# Patient Record
Sex: Female | Born: 1950 | Race: White | Hispanic: No | Marital: Married | Smoking: Former smoker
Health system: Southern US, Community
[De-identification: ages and names within clinical notes are randomized; demographics above are authoritative.]

## PROBLEM LIST (undated history)

## (undated) DIAGNOSIS — R600 Localized edema: Secondary | ICD-10-CM

## (undated) DIAGNOSIS — G4730 Sleep apnea, unspecified: Secondary | ICD-10-CM

## (undated) DIAGNOSIS — I739 Peripheral vascular disease, unspecified: Secondary | ICD-10-CM

## (undated) DIAGNOSIS — K219 Gastro-esophageal reflux disease without esophagitis: Secondary | ICD-10-CM

## (undated) DIAGNOSIS — E669 Obesity, unspecified: Secondary | ICD-10-CM

## (undated) DIAGNOSIS — I2510 Atherosclerotic heart disease of native coronary artery without angina pectoris: Secondary | ICD-10-CM

## (undated) DIAGNOSIS — M1990 Unspecified osteoarthritis, unspecified site: Secondary | ICD-10-CM

## (undated) DIAGNOSIS — K589 Irritable bowel syndrome without diarrhea: Secondary | ICD-10-CM

## (undated) DIAGNOSIS — I10 Essential (primary) hypertension: Secondary | ICD-10-CM

## (undated) DIAGNOSIS — K449 Diaphragmatic hernia without obstruction or gangrene: Secondary | ICD-10-CM

## (undated) DIAGNOSIS — I8500 Esophageal varices without bleeding: Secondary | ICD-10-CM

## (undated) DIAGNOSIS — H3530 Unspecified macular degeneration: Secondary | ICD-10-CM

## (undated) DIAGNOSIS — D509 Iron deficiency anemia, unspecified: Secondary | ICD-10-CM

## (undated) DIAGNOSIS — R0602 Shortness of breath: Secondary | ICD-10-CM

## (undated) DIAGNOSIS — Z720 Tobacco use: Secondary | ICD-10-CM

## (undated) DIAGNOSIS — N840 Polyp of corpus uteri: Secondary | ICD-10-CM

## (undated) DIAGNOSIS — R0989 Other specified symptoms and signs involving the circulatory and respiratory systems: Secondary | ICD-10-CM

## (undated) DIAGNOSIS — E785 Hyperlipidemia, unspecified: Secondary | ICD-10-CM

## (undated) DIAGNOSIS — E119 Type 2 diabetes mellitus without complications: Secondary | ICD-10-CM

## (undated) DIAGNOSIS — R079 Chest pain, unspecified: Secondary | ICD-10-CM

## (undated) HISTORY — DX: Peripheral vascular disease, unspecified: I73.9

## (undated) HISTORY — DX: Unspecified osteoarthritis, unspecified site: M19.90

## (undated) HISTORY — PX: CAROTID ENDARTERECTOMY: SUR193

## (undated) HISTORY — DX: Chest pain, unspecified: R07.9

## (undated) HISTORY — DX: Essential (primary) hypertension: I10

## (undated) HISTORY — PX: CHOLECYSTECTOMY: SHX55

## (undated) HISTORY — DX: Obesity, unspecified: E66.9

## (undated) HISTORY — DX: Type 2 diabetes mellitus without complications: E11.9

## (undated) HISTORY — DX: Tobacco use: Z72.0

## (undated) HISTORY — DX: Other specified symptoms and signs involving the circulatory and respiratory systems: R09.89

## (undated) HISTORY — DX: Esophageal varices without bleeding: I85.00

## (undated) HISTORY — DX: Diaphragmatic hernia without obstruction or gangrene: K44.9

## (undated) HISTORY — DX: Localized edema: R60.0

## (undated) HISTORY — DX: Atherosclerotic heart disease of native coronary artery without angina pectoris: I25.10

## (undated) HISTORY — DX: Sleep apnea, unspecified: G47.30

## (undated) HISTORY — DX: Unspecified macular degeneration: H35.30

## (undated) HISTORY — DX: Gastro-esophageal reflux disease without esophagitis: K21.9

## (undated) HISTORY — PX: APPENDECTOMY: SHX54

## (undated) HISTORY — DX: Irritable bowel syndrome, unspecified: K58.9

## (undated) HISTORY — DX: Shortness of breath: R06.02

## (undated) HISTORY — DX: Iron deficiency anemia, unspecified: D50.9

## (undated) HISTORY — DX: Polyp of corpus uteri: N84.0

## (undated) HISTORY — DX: Hyperlipidemia, unspecified: E78.5

---

## 1997-05-17 HISTORY — PX: CARDIAC CATHETERIZATION: SHX172

## 2001-07-01 ENCOUNTER — Encounter: Payer: Self-pay | Admitting: Cardiovascular Disease

## 2001-07-01 ENCOUNTER — Ambulatory Visit (HOSPITAL_COMMUNITY): Admission: RE | Admit: 2001-07-01 | Discharge: 2001-07-01 | Payer: Self-pay | Admitting: Cardiovascular Disease

## 2001-07-01 HISTORY — PX: CARDIAC CATHETERIZATION: SHX172

## 2001-09-01 ENCOUNTER — Other Ambulatory Visit: Admission: RE | Admit: 2001-09-01 | Discharge: 2001-09-01 | Payer: Self-pay | Admitting: Oncology

## 2001-09-21 ENCOUNTER — Encounter: Admission: RE | Admit: 2001-09-21 | Discharge: 2001-09-21 | Payer: Self-pay | Admitting: Surgery

## 2002-03-14 ENCOUNTER — Encounter: Admission: RE | Admit: 2002-03-14 | Discharge: 2002-03-14 | Payer: Self-pay | Admitting: Internal Medicine

## 2002-03-14 ENCOUNTER — Encounter: Payer: Self-pay | Admitting: Internal Medicine

## 2002-09-01 ENCOUNTER — Encounter: Admission: RE | Admit: 2002-09-01 | Discharge: 2002-09-01 | Payer: Self-pay | Admitting: Internal Medicine

## 2002-09-01 ENCOUNTER — Encounter: Payer: Self-pay | Admitting: Internal Medicine

## 2005-03-10 ENCOUNTER — Encounter: Admission: RE | Admit: 2005-03-10 | Discharge: 2005-03-10 | Payer: Self-pay | Admitting: Internal Medicine

## 2005-03-28 ENCOUNTER — Encounter: Admission: RE | Admit: 2005-03-28 | Discharge: 2005-03-28 | Payer: Self-pay | Admitting: Internal Medicine

## 2005-09-18 ENCOUNTER — Encounter: Admission: RE | Admit: 2005-09-18 | Discharge: 2005-09-18 | Payer: Self-pay | Admitting: Internal Medicine

## 2007-03-18 HISTORY — PX: CARDIAC CATHETERIZATION: SHX172

## 2007-03-30 ENCOUNTER — Encounter: Payer: Self-pay | Admitting: Endocrinology

## 2007-04-08 ENCOUNTER — Ambulatory Visit: Payer: Self-pay | Admitting: Endocrinology

## 2007-04-08 DIAGNOSIS — M1990 Unspecified osteoarthritis, unspecified site: Secondary | ICD-10-CM | POA: Insufficient documentation

## 2007-04-08 DIAGNOSIS — N184 Chronic kidney disease, stage 4 (severe): Secondary | ICD-10-CM

## 2007-04-08 DIAGNOSIS — K589 Irritable bowel syndrome without diarrhea: Secondary | ICD-10-CM | POA: Insufficient documentation

## 2007-04-08 DIAGNOSIS — G4730 Sleep apnea, unspecified: Secondary | ICD-10-CM | POA: Insufficient documentation

## 2007-04-08 DIAGNOSIS — K219 Gastro-esophageal reflux disease without esophagitis: Secondary | ICD-10-CM

## 2007-04-08 DIAGNOSIS — H3530 Unspecified macular degeneration: Secondary | ICD-10-CM | POA: Insufficient documentation

## 2007-04-08 DIAGNOSIS — I2510 Atherosclerotic heart disease of native coronary artery without angina pectoris: Secondary | ICD-10-CM

## 2007-04-08 DIAGNOSIS — E1122 Type 2 diabetes mellitus with diabetic chronic kidney disease: Secondary | ICD-10-CM | POA: Insufficient documentation

## 2007-04-08 DIAGNOSIS — E669 Obesity, unspecified: Secondary | ICD-10-CM | POA: Insufficient documentation

## 2007-04-12 ENCOUNTER — Telehealth (INDEPENDENT_AMBULATORY_CARE_PROVIDER_SITE_OTHER): Payer: Self-pay | Admitting: *Deleted

## 2007-06-23 ENCOUNTER — Encounter: Admission: RE | Admit: 2007-06-23 | Discharge: 2007-06-23 | Payer: Self-pay | Admitting: Obstetrics and Gynecology

## 2007-08-17 ENCOUNTER — Ambulatory Visit (HOSPITAL_COMMUNITY): Admission: RE | Admit: 2007-08-17 | Discharge: 2007-08-17 | Payer: Self-pay | Admitting: Obstetrics and Gynecology

## 2007-08-17 ENCOUNTER — Encounter (INDEPENDENT_AMBULATORY_CARE_PROVIDER_SITE_OTHER): Payer: Self-pay | Admitting: Obstetrics and Gynecology

## 2007-09-20 ENCOUNTER — Encounter: Payer: Self-pay | Admitting: Endocrinology

## 2007-09-28 ENCOUNTER — Telehealth (INDEPENDENT_AMBULATORY_CARE_PROVIDER_SITE_OTHER): Payer: Self-pay | Admitting: *Deleted

## 2007-10-07 ENCOUNTER — Ambulatory Visit: Payer: Self-pay | Admitting: Endocrinology

## 2007-10-07 DIAGNOSIS — R05 Cough: Secondary | ICD-10-CM

## 2008-05-25 ENCOUNTER — Encounter: Admission: RE | Admit: 2008-05-25 | Discharge: 2008-05-25 | Payer: Self-pay | Admitting: Orthopedic Surgery

## 2008-07-20 ENCOUNTER — Ambulatory Visit (HOSPITAL_COMMUNITY): Admission: RE | Admit: 2008-07-20 | Discharge: 2008-07-20 | Payer: Self-pay | Admitting: Anesthesiology

## 2008-08-08 ENCOUNTER — Ambulatory Visit (HOSPITAL_COMMUNITY): Admission: RE | Admit: 2008-08-08 | Discharge: 2008-08-08 | Payer: Self-pay | Admitting: Internal Medicine

## 2008-08-19 ENCOUNTER — Encounter: Admission: RE | Admit: 2008-08-19 | Discharge: 2008-08-19 | Payer: Self-pay | Admitting: Anesthesiology

## 2008-09-13 ENCOUNTER — Encounter: Admission: RE | Admit: 2008-09-13 | Discharge: 2008-09-13 | Payer: Self-pay | Admitting: Internal Medicine

## 2008-09-13 ENCOUNTER — Encounter (INDEPENDENT_AMBULATORY_CARE_PROVIDER_SITE_OTHER): Payer: Self-pay | Admitting: Interventional Radiology

## 2008-09-13 ENCOUNTER — Other Ambulatory Visit: Admission: RE | Admit: 2008-09-13 | Discharge: 2008-09-13 | Payer: Self-pay | Admitting: Interventional Radiology

## 2009-01-23 ENCOUNTER — Encounter: Admission: RE | Admit: 2009-01-23 | Discharge: 2009-01-23 | Payer: Self-pay | Admitting: Endocrinology

## 2009-03-22 ENCOUNTER — Encounter: Admission: RE | Admit: 2009-03-22 | Discharge: 2009-03-22 | Payer: Self-pay | Admitting: Internal Medicine

## 2009-04-17 ENCOUNTER — Ambulatory Visit: Payer: Self-pay | Admitting: Oncology

## 2009-04-26 LAB — CBC WITH DIFFERENTIAL/PLATELET
Basophils Absolute: 0.1 10*3/uL (ref 0.0–0.1)
Eosinophils Absolute: 0.2 10*3/uL (ref 0.0–0.5)
HGB: 12.8 g/dL (ref 11.6–15.9)
LYMPH%: 27.4 % (ref 14.0–49.7)
MONO#: 0.4 10*3/uL (ref 0.1–0.9)
NEUT#: 4.5 10*3/uL (ref 1.5–6.5)
Platelets: 116 10*3/uL — ABNORMAL LOW (ref 145–400)
RBC: 4.48 10*6/uL (ref 3.70–5.45)
RDW: 14.3 % (ref 11.2–14.5)
WBC: 7.1 10*3/uL (ref 3.9–10.3)

## 2009-04-26 LAB — LACTATE DEHYDROGENASE: LDH: 107 U/L (ref 94–250)

## 2009-04-26 LAB — COMPREHENSIVE METABOLIC PANEL
Albumin: 3.8 g/dL (ref 3.5–5.2)
CO2: 22 mEq/L (ref 19–32)
Glucose, Bld: 399 mg/dL — ABNORMAL HIGH (ref 70–99)
Potassium: 4.8 mEq/L (ref 3.5–5.3)
Sodium: 136 mEq/L (ref 135–145)
Total Protein: 6.9 g/dL (ref 6.0–8.3)

## 2009-05-30 ENCOUNTER — Ambulatory Visit: Payer: Self-pay | Admitting: Oncology

## 2009-06-26 LAB — CBC WITH DIFFERENTIAL/PLATELET
BASO%: 0.8 % (ref 0.0–2.0)
Basophils Absolute: 0.1 10*3/uL (ref 0.0–0.1)
EOS%: 3 % (ref 0.0–7.0)
Eosinophils Absolute: 0.2 10*3/uL (ref 0.0–0.5)
HCT: 37.4 % (ref 34.8–46.6)
LYMPH%: 27.5 % (ref 14.0–49.7)
MCHC: 33.4 g/dL (ref 31.5–36.0)
MCV: 87 fL (ref 79.5–101.0)
MONO%: 5.6 % (ref 0.0–14.0)
NEUT#: 5.3 10*3/uL (ref 1.5–6.5)
RBC: 4.3 10*6/uL (ref 3.70–5.45)
RDW: 14.4 % (ref 11.2–14.5)
WBC: 8.4 10*3/uL (ref 3.9–10.3)

## 2010-01-24 ENCOUNTER — Encounter: Admission: RE | Admit: 2010-01-24 | Discharge: 2010-01-24 | Payer: Self-pay | Admitting: Unknown Physician Specialty

## 2010-06-08 ENCOUNTER — Encounter: Payer: Self-pay | Admitting: Unknown Physician Specialty

## 2010-06-09 ENCOUNTER — Encounter: Payer: Self-pay | Admitting: Orthopedic Surgery

## 2010-06-09 ENCOUNTER — Encounter: Payer: Self-pay | Admitting: Internal Medicine

## 2010-06-09 ENCOUNTER — Encounter: Payer: Self-pay | Admitting: Anesthesiology

## 2010-10-01 NOTE — Op Note (Signed)
NAMELENNY, BOUCHILLON                ACCOUNT NO.:  [id number]   MEDICAL RECORD NO.:  [id number]          PATIENT TYPE:  AMB   LOCATION:  SDC                           FACILITY:  [facility]   PHYSICIAN:  Carrington Clamp, M.D. DATE OF BIRTH:  11-01-1950   DATE OF PROCEDURE:  08/17/2007  DATE OF DISCHARGE:                               OPERATIVE REPORT   PREOPERATIVE DIAGNOSIS:  Postmenopausal bleeding.   POSTOPERATIVE DIAGNOSIS:  Endometrial polyp.   PROCEDURES:  Dilation and curettage with hysteroscopy.   SURGEON:  Carrington Clamp, M.D.   ANESTHESIA:  General LMA.   FINDINGS:  A large 2 x 3-cm polyp coming from the left fundus of the  uterus, but there was no other abnormal tissue seen.   SPECIMENS:  Uterine curettings and polyp to pathology.   ESTIMATED BLOOD LOSS:  Minimal.   IV FLUIDS:  1200 mL.   URINE OUTPUT:  Not measured.   COMPLICATIONS:  None.   MEDICATIONS:  None.   COUNT:  Correct x3.   TECHNIQUE:  After adequate general anesthesia was achieved, the patient  was prepped and draped in the usual sterile fashion in the dorsal  lithotomy position.  The bladder was emptied with a red rubber catheter  and the speculum placed in the vagina.  A single-tooth tenaculum was  used to stabilize the cervix and the cervix was dilated with Hanks  dilators.  The hysteroscope was passed into the uterine cavity and the  above findings noted.  The 2-cm polyp was removed with polyp forceps in  several pieces.  Curettage was then performed with a sharp curette.  Hysteroscopy indicated all removal of the polyp and some endometrial  tissue.  Hysteroscopic deficit was measured  at 135 but there was at least 100 mL of fluid that had, unfortunately,  fallen on the floor; therefore, the hysteroscopic deficit was really 35.  The patient tolerated the procedure well and was returned to the  recovery room in stable condition.      Carrington Clamp, M.D.  Electronically Signed     MH/MEDQ  D:  08/17/2007  T:  08/17/2007  Job:  [id number]

## 2010-10-02 ENCOUNTER — Other Ambulatory Visit: Payer: Self-pay | Admitting: Surgery

## 2010-10-02 DIAGNOSIS — E041 Nontoxic single thyroid nodule: Secondary | ICD-10-CM

## 2010-10-04 NOTE — Cardiovascular Report (Signed)
[place]. [facility]  Patient:    Monica [name], Monica [name] Visit Number: [id number] MRN: [id number]          Service Type: CAT Location: [facility] 2899 11 Attending Physician:  Berry, Jonathan [place] Dictated by:   Runell Gess, M.D. Proc. Date: 07/01/01 Admit Date:  07/01/2001   CC:         Second Floor [facility]  Feliciana Rossetti, M.D., [facility] [address], [place] [address]   Cardiac Catheterization  INDICATIONS:  [name] is a 60 year old, moderately overweight white female with positive cardiac risk factors with a history of normal catheterization May 18, 1997, who has had recurrent chest pain and a Cardiolite stress test suggestive of ischemia in the RCA territory.  She presents now for diagnostic coronary arteriography.  PROCEDURE:  Diagnostic coronary arteriography  CARDIOLOGIST:  Runell Gess, M.D.  DESCRIPTION OF PROCEDURE:  The patient was brought to second floor [facility] in the postabsorptive state.  She was premedicated with p.o. Valium, IV Versed, and fentanyl.  Her right groin was prepped and shaved in the usual sterile fashion.  Xylocaine 1% was used for local anesthesia.  A 6-French sheath was inserted into the right femoral artery using standard Seldinger technique.  Then, 6-French right and left Judkins diagnostic catheters along with a 6-French pigtail catheter were used for selective coronary angiography, left ventriculography, and distal abdominal aortography.  Omnipaque dye was used for the entirety of the case.  Retrograde aorta, left ventricular, and pullback pressures were recorded.  HEMODYNAMICS: 1. Aortic systolic pressure 149, diastolic pressure 85. 2. Left ventricular systolic pressure 148, diastolic pressure 14.  SELECTIVE CORONARY ANGIOGRAPHY: 1. Left main normal. 2. LAD normal. 3. Left circumflex normal. 4. Right  coronary artery was large, dominant, and normal.  LEFT VENTRICULOGRAPHY:  RAO left ventriculogram was performed using 25 cc of Omnipaque dye at 12 cc/second.  The overall LV EF was estimated at greater than 60% without focal wall motion abnormalities.  DISTAL ABDOMINAL AORTOGRAPHY:  Distal abdominal aortogram was performed using 20 cc of Omnipaque dye at 20 cc per second.  The renal artery was widely patent.  The infrarenal abdominal aorta was free of aneurysmal or atherosclerotic changes.  IMPRESSION:  Ms. Hullum has essentially normal coronary arteries and normal left ventricular function.  She also has normal renal arteries.  I suspect the chest pain is noncardiac and most likely multifactorial including reflux related.  She had a false positive Cardiolite.  The guidewire and catheters were removed.  The sheath was removed and pressure applied to the groin to achieve hemostasis.  The patient left the lab in stable condition.  Continued medical therapy will be recommended.  The patient will be discharged home today after remaining supine for 6 hours, and we will see her back in the office in three to four weeks in followup.  She left the lab in stable condition.Dictated by:   Runell Gess, M.D. Attending Physician:  Berry, Jonathan [place] DD:  07/01/01 TD:  07/01/01 Job: [id number]

## 2010-10-07 ENCOUNTER — Other Ambulatory Visit: Payer: Self-pay | Admitting: Orthopedic Surgery

## 2010-10-07 DIAGNOSIS — M25561 Pain in right knee: Secondary | ICD-10-CM

## 2010-10-07 DIAGNOSIS — M25511 Pain in right shoulder: Secondary | ICD-10-CM

## 2010-10-16 ENCOUNTER — Other Ambulatory Visit: Payer: Self-pay

## 2010-10-21 ENCOUNTER — Other Ambulatory Visit: Payer: Self-pay

## 2010-10-21 ENCOUNTER — Emergency Department (HOSPITAL_COMMUNITY): Payer: Medicare Other

## 2010-10-21 ENCOUNTER — Emergency Department (HOSPITAL_COMMUNITY)
Admission: EM | Admit: 2010-10-21 | Discharge: 2010-10-21 | Disposition: A | Discharge status: Home / Self Care | Payer: Medicare Other | Attending: Emergency Medicine | Admitting: Emergency Medicine

## 2010-10-21 DIAGNOSIS — M549 Dorsalgia, unspecified: Secondary | ICD-10-CM | POA: Insufficient documentation

## 2010-10-21 DIAGNOSIS — M542 Cervicalgia: Secondary | ICD-10-CM | POA: Insufficient documentation

## 2010-10-21 DIAGNOSIS — M25569 Pain in unspecified knee: Secondary | ICD-10-CM | POA: Insufficient documentation

## 2010-10-21 DIAGNOSIS — R51 Headache: Secondary | ICD-10-CM | POA: Insufficient documentation

## 2010-10-22 ENCOUNTER — Other Ambulatory Visit: Payer: Self-pay

## 2010-10-22 ENCOUNTER — Inpatient Hospital Stay: Admission: RE | Admit: 2010-10-22 | Payer: Self-pay | Source: Ambulatory Visit

## 2010-11-27 ENCOUNTER — Ambulatory Visit
Admission: RE | Admit: 2010-11-27 | Discharge: 2010-11-27 | Disposition: A | Discharge status: Home / Self Care | Payer: Medicare Other | Source: Ambulatory Visit | Attending: Surgery | Admitting: Surgery

## 2010-11-27 ENCOUNTER — Ambulatory Visit
Admission: RE | Admit: 2010-11-27 | Discharge: 2010-11-27 | Disposition: A | Discharge status: Home / Self Care | Payer: Medicaid Other | Source: Ambulatory Visit | Attending: Surgery | Admitting: Surgery

## 2010-11-27 DIAGNOSIS — E041 Nontoxic single thyroid nodule: Secondary | ICD-10-CM

## 2010-12-04 ENCOUNTER — Encounter: Payer: Self-pay | Admitting: Internal Medicine

## 2010-12-04 NOTE — Progress Notes (Signed)
Patient sees Dr Chales Abrahams in [place], [place] for Gastroenterology needs. Had colonoscopy there in 2011.

## 2010-12-23 ENCOUNTER — Other Ambulatory Visit: Payer: Medicaid Other

## 2011-02-03 ENCOUNTER — Other Ambulatory Visit: Payer: Medicare Other

## 2011-02-04 ENCOUNTER — Ambulatory Visit: Payer: Medicare Other | Admitting: Internal Medicine

## 2011-02-10 LAB — BASIC METABOLIC PANEL
BUN: 9
CO2: 29
Calcium: 9
Chloride: 99
Chloride: 96
Creatinine, Ser: 0.7
GFR calc Af Amer: >60
GFR calc non Af Amer: >60
Glucose, Bld: 379 — ABNORMAL HIGH
Potassium: 4.3
Potassium: 4.1
Sodium: 132 — ABNORMAL LOW

## 2011-02-10 LAB — CBC
HCT: 40.9
MCHC: 33.5
MCV: 83.5
Platelets: 226
Platelets: 221
RBC: 4.79
RDW: 14.6
RDW: 13.9

## 2011-02-10 LAB — URINALYSIS, ROUTINE W REFLEX MICROSCOPIC
Bilirubin Urine: NEGATIVE
Ketones, ur: NEGATIVE
Nitrite: NEGATIVE
Urobilinogen, UA: 0.2

## 2011-03-18 ENCOUNTER — Ambulatory Visit (INDEPENDENT_AMBULATORY_CARE_PROVIDER_SITE_OTHER): Payer: Medicare Other | Admitting: Internal Medicine

## 2011-03-18 ENCOUNTER — Encounter: Payer: Self-pay | Admitting: Internal Medicine

## 2011-03-18 ENCOUNTER — Other Ambulatory Visit (INDEPENDENT_AMBULATORY_CARE_PROVIDER_SITE_OTHER): Payer: Medicare Other

## 2011-03-18 DIAGNOSIS — R933 Abnormal findings on diagnostic imaging of other parts of digestive tract: Secondary | ICD-10-CM

## 2011-03-18 DIAGNOSIS — K7460 Unspecified cirrhosis of liver: Secondary | ICD-10-CM

## 2011-03-18 DIAGNOSIS — R634 Abnormal weight loss: Secondary | ICD-10-CM

## 2011-03-18 DIAGNOSIS — K589 Irritable bowel syndrome without diarrhea: Secondary | ICD-10-CM

## 2011-03-18 DIAGNOSIS — K219 Gastro-esophageal reflux disease without esophagitis: Secondary | ICD-10-CM

## 2011-03-18 LAB — PROTIME-INR: INR: 1.5 ratio — ABNORMAL HIGH (ref 0.8–1.0)

## 2011-03-18 NOTE — Patient Instructions (Addendum)
You have been given a separate informational sheet regarding your tobacco use, the importance of quitting and local resources to help you quit. Go to basement for labs Follow-up in three weeks with Dr. Marina Goodell

## 2011-03-19 LAB — IBC PANEL: Iron: 22 ug/dL — ABNORMAL LOW (ref 42–145)

## 2011-03-19 LAB — ANA: Anti Nuclear Antibody(ANA): NEGATIVE

## 2011-03-19 LAB — FERRITIN: Ferritin: 140.8 ng/mL (ref 10.0–291.0)

## 2011-03-19 LAB — IRON: Iron: 22 ug/dL — ABNORMAL LOW (ref 42–145)

## 2011-03-20 ENCOUNTER — Encounter: Payer: Self-pay | Admitting: Internal Medicine

## 2011-03-20 NOTE — Progress Notes (Signed)
HISTORY OF PRESENT ILLNESS:  Monica Monica [name] is a 60 y.o. female with MULTIPLE medical problems including diabetes mellitus on insulin, coronary artery disease on Plavix, chronic morbid obesity, sleep apnea, GERD, anxiety on anxiolytics, hypertension, and irritable bowel syndrome. She is also status post appendectomy and cholecystectomy. She is new to this office, but has had gastroenterology care in [place]. She is sent by her primary provider with [facility]. Reasons for the visit seemed to be chronic abdominal pain, weight loss, and a new diagnosis of hepatic cirrhosis by CT scan. She is accompanied by her husband who contributes to the history. Apparently, she has had decades of alternating bowel habits abdominal discomfort and bloating. I'm told that she has undergone extensive workup in the past including colonoscopy, upper endoscopy, and capsule endoscopy. I'm also told, that she has seen gastroenterologists at 2 separate tertiary care centers, [facility] and [facility]. Initially sent to rule out inflammatory bowel disease but ultimately diagnosed with irritable bowel syndrome. She continues to have alternating bowel habits and chronic lower abdominal discomfort often exacerbated by meals. Review of outside records finds a colonoscopy report dated 05/23/2009. She was found to have moderate internal hemorrhoids, mild sigmoid diverticulosis, and an incidental lipoma in the region of the terminal ileum. They bring photographs. I do not have an upper endoscopy report, though there are photographs on upper endoscopy, which appear unremarkable. There are dated the same. They were under the impression that they have a "tumor" in the colon and a curious as to why it was not removed. She also reports a several year history of right-sided abdominal pain that occurs 24 hours a day 7 days a week. She cannot identify any exacerbating or relieving factors, though the pain is worse with direct palpation and  occasionally meals. She also reports a 75 pound weight loss over the past 8 months. She has had decreased appetite. She has Monica Monica [name] her hair. Review of outside laboratories from October 2012 show hemoglobin of 11.2, white blood cell count 12.6, and platelet count of 198,000. Negative acute hepatitis serologies. Liver function tests normal protein 6.7, albumin 3.4. Electrolytes normal. Creatinine 1.1. Because of chronic right-sided pain she underwent an abdominal ultrasound dated 03/12/2011. 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 CT scan was recommended and performed 03/05/2011 (not clear why the dates of liquidy sequence reported by the patient) and was said to show a nodular hepatic contour suggesting cirrhosis as well as mild splenomegaly measuring 13.7 cm. No other abnormalities reported. The patient denies a personal or family history of liver disease. She does not consume alcohol. Reports being about 300 pounds for great majority of her life. No history of IV drug use or blood transfusions. She is apparently anticipating hysterectomy.  REVIEW OF SYSTEMS:  All non-GI ROS negative except for anxiety, arthritis, back pain, left eye blindness, cough, fatigue, headaches, itching, swelling of the feet, excessive thirst, urinary leakage, alopecia  Past Medical History  Diagnosis Date  . Type II or unspecified type diabetes mellitus without mention of complication, not stated as uncontrolled   . Coronary atherosclerosis of unspecified type of vessel, native or graft   . GERD (gastroesophageal reflux disease)   . Osteoarthrosis, unspecified whether generalized or localized, unspecified site   . Obesity, unspecified   . Macular degeneration (senile) of retina, unspecified   . Unspecified sleep apnea   . Irritable bowel syndrome   . Endometrial polyp   . Hypertension     Past Surgical  History  Procedure Date  . Appendectomy   . Cholecystectomy     Social History Monica Monica [name]  reports that she has Monica [name] smoking Cigarettes.  She has never used smokeless tobacco. She reports that she does not drink alcohol or use illicit drugs.  family history includes Breast cancer in her mother and sister; Colon cancer in her paternal aunt; and Diabetes in her brother, father, and sister.  Allergies  Allergen Reactions  . Clarithromycin   . Penicillins   . Sulfonamide Derivatives        PHYSICAL EXAMINATION: Vital signs: BP 98/56  Pulse 56  Ht 5\' 5"  (1.651 m)  Wt 177 lb (80.287 kg)  BMI 29.45 kg/m2  Constitutional: Chronically ill-appearing, no acute distress. Obese Psychiatric: alert and oriented x3, cooperative Head: Alopecia Eyes: extraocular movements intact, anicteric, conjunctiva pink Mouth: oral pharynx moist, no lesions Neck: supple no lymphadenopathy Cardiovascular: heart regular rate and rhythm, systolic murmur  Lungs: clear to auscultation bilaterally Abdomen: soft, obese nontender, with exquisite tenderness over the superficial abdominal wall with minimal palpation, no obvious ascites, no peritoneal signs, normal bowel sounds, no organomegaly. Prior surgical scars obvious and well healed Rectal: Ommitted Extremities: no lower extremity edema bilaterally Skin: no lesions on visible extremities Neuro: No focal deficits. No asterixis.     ASSESSMENT:  #1. Chronic right-sided abdominal pain. Musculoskeletal in nature #2. 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 No evidence for decompensated liver disease. #3. Chronic irritable bowel syndrome #4. GERD. Controlled with pantoprazole   PLAN:  #1. Treatment of musculoskeletal pain per primary provider. #2. Long discussion today regarding hepatic cirrhosis. Discussed NASH. Also discussed potential complications of liver disease. Will obtain additional blood work to rule out other potential causes for chronic liver disease. Also check thyroid function  regarding weight loss and hair loss #3. Discussion on irritable bowel syndrome #4. Office followup in a few weeks. Would consider upper endoscopy to assess for varices

## 2011-04-09 ENCOUNTER — Ambulatory Visit: Payer: Medicare Other | Admitting: Internal Medicine

## 2011-04-22 ENCOUNTER — Ambulatory Visit: Payer: Medicare Other | Admitting: Internal Medicine

## 2011-04-25 ENCOUNTER — Ambulatory Visit: Payer: Medicare Other | Admitting: Internal Medicine

## 2011-05-05 ENCOUNTER — Ambulatory Visit: Payer: Medicare Other | Admitting: Internal Medicine

## 2011-05-09 ENCOUNTER — Ambulatory Visit: Payer: Medicare Other | Admitting: Internal Medicine

## 2011-05-26 ENCOUNTER — Encounter: Payer: Self-pay | Admitting: Internal Medicine

## 2011-05-26 ENCOUNTER — Ambulatory Visit (INDEPENDENT_AMBULATORY_CARE_PROVIDER_SITE_OTHER): Payer: Medicare Other | Admitting: Internal Medicine

## 2011-05-26 VITALS — BP 130/64 | HR 64 | Ht 65.5 in | Wt 181.4 lb

## 2011-05-26 DIAGNOSIS — D509 Iron deficiency anemia, unspecified: Secondary | ICD-10-CM

## 2011-05-26 DIAGNOSIS — K7460 Unspecified cirrhosis of liver: Secondary | ICD-10-CM

## 2011-05-26 DIAGNOSIS — E119 Type 2 diabetes mellitus without complications: Secondary | ICD-10-CM

## 2011-05-26 DIAGNOSIS — K766 Portal hypertension: Secondary | ICD-10-CM

## 2011-05-26 DIAGNOSIS — K589 Irritable bowel syndrome without diarrhea: Secondary | ICD-10-CM

## 2011-05-26 MED ORDER — FERROUS SULFATE 325 (65 FE) MG PO TABS: 325.0000 mg | ORAL_TABLET | Freq: Every day | ORAL | Status: DC

## 2011-05-26 NOTE — Progress Notes (Signed)
HISTORY OF PRESENT ILLNESS:  Monica [name] is a 61 y.o. female with MULTIPLE medical problems including diabetes mellitus on insulin, coronary artery disease on Plavix, chronic morbid obesity, sleep apnea, GERD, anxiety on anxiolytics, hypertension, and irritable bowel syndrome. She is also status post appendectomy and cholecystectomy. She was initially seen in the office 03/18/2011 regarding newly diagnosed hepatic cirrhosis, chronic right-sided abdominal pain, chronic alternating bowel habits consistent with irritable bowel syndrome, and GERD. See that dictation for details. She has had multiple prior GI evaluations as previously outlined. 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 Additionally, right-sided musculoskeletal pain, chronic irritable bowel syndrome, and GERD. Multiple laboratories were obtained at that time. Supplemental studies did not support other causes for chronic liver disease. Iron saturation was low. She is accompanied today by her husband. Overall she states she is feeling somewhat better, for no particular reason. Chronic right-sided pain is unchanged. Continues to complain apparent loss. Thyroid testing unremarkable. We reviewed her studies in detail.. She is again accompanied by her husband who participates during the encounter.  REVIEW OF SYSTEMS:  All non-GI ROS negative except for fatigue,hair loss  Past Medical History  Diagnosis Date  . Type II or unspecified type diabetes mellitus without mention of complication, not stated as uncontrolled   . Coronary atherosclerosis of unspecified type of vessel, native or graft   . GERD (gastroesophageal reflux disease)   . Osteoarthrosis, unspecified whether generalized or localized, unspecified site   . Obesity, unspecified   . Macular degeneration (senile) of retina, unspecified   . Unspecified sleep apnea   . Irritable bowel syndrome   . Endometrial polyp   . Hypertension      Past Surgical History  Procedure Date  . Appendectomy   . Cholecystectomy   . Carotid endarterectomy     Social History Monica [name]  reports that she has been smoking Cigarettes.  She has never used smokeless tobacco. She reports that she does not drink alcohol or use illicit drugs.  family history includes Breast cancer in her mother and sister; Colon cancer in her paternal aunt; Diabetes in her brother, father, and sister; and Stomach cancer in her paternal aunt.  Allergies  Allergen Reactions  . Clarithromycin   . Latex   . Penicillins   . Sulfonamide Derivatives        PHYSICAL EXAMINATION: Vital signs: BP 130/64  Pulse 64  Ht 5' 5.5" (1.664 m)  Wt 181 lb 6.4 oz (82.283 kg)  BMI 29.73 kg/m2.- gained 4 pounds General: Well-developed, well-nourished, no acute distress HEENT: Sclerae are anicteric, conjunctiva pink. Oral mucosa intact Lungs: Clear Heart: Regular Abdomen: soft,obese, muscle wall tenderness on the right, nondistended, no obvious ascites, no peritoneal signs, normal bowel sounds. No organomegaly.prior surgical incisions well-healed Extremities: No edema Psychiatric: alert and oriented x3. Cooperative    ASSESSMENT:  #1. Hepatic cirrhosis with mild portal hypertension. Mild thrombocytopenia, mildly increased prothrombin time, changes on CT. 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 GERD. Controlled with PPI #3. Chronic irritable bowel syndrome. Ongoing #4. Right-sided musculoskeletal pain. Chronic and ongoing #5. Iron deficiency. Question related to hair loss #6. Multiple medical problems including diabetes   PLAN:  #1. Iron sulfate 325 mg daily prescribed #2. Continue PPI #3. Screening upper endoscopy for varices.The nature of the procedure, as well as the risks, benefits, and alternatives were carefully and thoroughly reviewed with the patient. Ample time for discussion and questions allowed. The patient understood, was satisfied, and  agreed to  proceed. Hold diabetic medications today at the procedure until resuming by mouth intake. Monitor blood sugar before and after the procedure. #4. Return to the care of Dr. Ardelle Park for general medical needs

## 2011-05-26 NOTE — Patient Instructions (Addendum)
You have been given a separate informational sheet regarding your tobacco use, the importance of quitting and local resources to help you quit.  You have been scheduled for an endoscopy. Please follow written instructions given to you at your visit today.  We have sent a prescription for Iron to your pharmacy

## 2011-06-03 ENCOUNTER — Encounter: Payer: Self-pay | Admitting: Internal Medicine

## 2011-06-03 ENCOUNTER — Ambulatory Visit (AMBULATORY_SURGERY_CENTER): Payer: Medicare Other | Admitting: Internal Medicine

## 2011-06-03 DIAGNOSIS — D509 Iron deficiency anemia, unspecified: Secondary | ICD-10-CM

## 2011-06-03 DIAGNOSIS — K219 Gastro-esophageal reflux disease without esophagitis: Secondary | ICD-10-CM

## 2011-06-03 DIAGNOSIS — K589 Irritable bowel syndrome without diarrhea: Secondary | ICD-10-CM

## 2011-06-03 DIAGNOSIS — I8500 Esophageal varices without bleeding: Secondary | ICD-10-CM

## 2011-06-03 DIAGNOSIS — K7460 Unspecified cirrhosis of liver: Secondary | ICD-10-CM

## 2011-06-03 DIAGNOSIS — K766 Portal hypertension: Secondary | ICD-10-CM

## 2011-06-03 LAB — GLUCOSE, CAPILLARY
Glucose-Capillary: 164 mg/dL — ABNORMAL HIGH (ref 70–99)
Glucose-Capillary: 168 mg/dL — ABNORMAL HIGH (ref 70–99)

## 2011-06-03 MED ORDER — SODIUM CHLORIDE 0.9 % IV SOLN: 500.0000 mL | INTRAVENOUS | Status: DC

## 2011-06-03 NOTE — Progress Notes (Signed)
Patient did not have preoperative order for IV antibiotic SSI prophylaxis. (G8918)  Patient did not experience any of the following events: a burn prior to discharge; a fall within the facility; wrong site/side/patient/procedure/implant event; or a hospital transfer or hospital admission upon discharge from the facility. (G8907)  

## 2011-06-03 NOTE — Patient Instructions (Signed)
Please follow the instructions on your discharge papers.  Call us if you have any questions at [phone]. Thank-you.

## 2011-06-03 NOTE — Progress Notes (Signed)
Pt states that she fell in her bathroom this morning around 0230. She states that she was going to get a glass of water and fell on her bottom. She states that she is sore and her back pain is worse from the fall.

## 2011-06-03 NOTE — Op Note (Signed)
[facility] [address] [place], [place]  [address]  ENDOSCOPY PROCEDURE REPORT  PATIENT:  Monica [name], Monica [name]  MR#:  [id number] BIRTHDATE:  [birth year], 60 yrs. old  GENDER:  female  ENDOSCOPIST:  Wilhemina Bonito. Eda Keys, MD Referred by:  Office / Self  PROCEDURE DATE:  06/03/2011 PROCEDURE:  EGD, diagnostic 40102 ASA CLASS:  Class III INDICATIONS:  Evaluate for esophageal varices in a patient with portal hypertension and/or cirrhosis., iron deficiency anemia  MEDICATIONS:   MAC sedation, administered by CRNA, propofol (Diprivan) 100 mg IV TOPICAL ANESTHETIC:  none  DESCRIPTION OF PROCEDURE:   After the risks benefits and alternatives of the procedure were thoroughly explained, informed consent was obtained.  The LB GIF-H180 [id number] endoscope was introduced through the mouth and advanced to the second portion of the duodenum, without limitations.  The instrument was slowly withdrawn as the mucosa was fully examined. 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 Otherwise the examination of esophagus, stomach, and duodnum was normal.    Retroflexed views revealed a small hiatal hernia. The scope was then withdrawn from the patient and the procedure completed.  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 Ardelle Park  ______________________________ Wilhemina Bonito. Eda Keys, MD  CC:  Donnel Saxon, MD;   The Patient  n. eSIGNED:   Wilhemina Bonito. Eda Keys at 06/03/2011 03:28 PM  Eden Emms, [phone]

## 2011-06-04 ENCOUNTER — Telehealth: Payer: Self-pay | Admitting: *Deleted

## 2011-06-04 NOTE — Telephone Encounter (Signed)
No answer, message left

## 2011-07-16 ENCOUNTER — Telehealth: Payer: Self-pay | Admitting: Internal Medicine

## 2011-07-16 NOTE — Telephone Encounter (Signed)
THIS HAS BEEN EVALUATED AND NOT FELT TO BE SECONDARY TO HER LIVER OR ANYTHING GI. PAIN MUSCULOSKELETAL. SEE PCP

## 2011-07-16 NOTE — Telephone Encounter (Signed)
Pt states she has been having some pain from her liver. She wants to know if she needs a referral to a liver specialist. Pt states she likes Dr. Marina Goodell and she didn't know if he can treat her cirrhosis or if she needs to see a specialist. Pt wanted to get Dr. Lamar Sprinkles opinion. Dr. Marina Goodell please advise.

## 2011-07-16 NOTE — Telephone Encounter (Signed)
Pt aware.

## 2011-07-28 ENCOUNTER — Other Ambulatory Visit: Payer: Self-pay | Admitting: Internal Medicine

## 2011-07-28 DIAGNOSIS — G35 Multiple sclerosis: Secondary | ICD-10-CM

## 2011-08-02 ENCOUNTER — Other Ambulatory Visit: Payer: Medicare Other

## 2011-08-04 ENCOUNTER — Other Ambulatory Visit: Payer: Self-pay | Admitting: Internal Medicine

## 2011-08-04 DIAGNOSIS — M5412 Radiculopathy, cervical region: Secondary | ICD-10-CM

## 2011-08-22 ENCOUNTER — Ambulatory Visit
Admission: RE | Admit: 2011-08-22 | Discharge: 2011-08-22 | Disposition: A | Discharge status: Home / Self Care | Payer: Medicare Other | Source: Ambulatory Visit | Attending: Internal Medicine | Admitting: Internal Medicine

## 2011-08-22 DIAGNOSIS — G35 Multiple sclerosis: Secondary | ICD-10-CM

## 2011-08-22 DIAGNOSIS — M5412 Radiculopathy, cervical region: Secondary | ICD-10-CM

## 2011-09-04 ENCOUNTER — Other Ambulatory Visit: Payer: Self-pay | Admitting: Internal Medicine

## 2011-09-04 DIAGNOSIS — I6529 Occlusion and stenosis of unspecified carotid artery: Secondary | ICD-10-CM

## 2011-09-04 DIAGNOSIS — G459 Transient cerebral ischemic attack, unspecified: Secondary | ICD-10-CM

## 2011-09-11 ENCOUNTER — Other Ambulatory Visit: Payer: Medicare Other

## 2011-09-22 ENCOUNTER — Other Ambulatory Visit: Payer: Medicare Other

## 2011-09-22 ENCOUNTER — Inpatient Hospital Stay: Admission: RE | Admit: 2011-09-22 | Payer: Medicare Other | Source: Ambulatory Visit

## 2011-09-29 ENCOUNTER — Other Ambulatory Visit: Payer: Medicare Other

## 2011-09-29 ENCOUNTER — Inpatient Hospital Stay: Admission: RE | Admit: 2011-09-29 | Payer: Medicare Other | Source: Ambulatory Visit

## 2011-10-21 ENCOUNTER — Other Ambulatory Visit: Payer: Medicare Other

## 2011-10-21 ENCOUNTER — Inpatient Hospital Stay: Admission: RE | Admit: 2011-10-21 | Payer: Medicare Other | Source: Ambulatory Visit

## 2011-10-29 ENCOUNTER — Other Ambulatory Visit: Payer: Self-pay | Admitting: Internal Medicine

## 2011-10-29 DIAGNOSIS — M25511 Pain in right shoulder: Secondary | ICD-10-CM

## 2011-10-30 ENCOUNTER — Other Ambulatory Visit: Payer: Medicare Other

## 2011-10-30 ENCOUNTER — Inpatient Hospital Stay
Admission: RE | Admit: 2011-10-30 | Discharge: 2011-10-30 | Payer: Medicare Other | Source: Ambulatory Visit | Attending: Internal Medicine | Admitting: Internal Medicine

## 2011-11-03 ENCOUNTER — Other Ambulatory Visit: Payer: Medicare Other

## 2011-12-03 ENCOUNTER — Ambulatory Visit: Payer: Medicare Other | Admitting: Internal Medicine

## 2011-12-30 ENCOUNTER — Ambulatory Visit (INDEPENDENT_AMBULATORY_CARE_PROVIDER_SITE_OTHER): Payer: Medicare Other | Admitting: Internal Medicine

## 2011-12-30 ENCOUNTER — Encounter: Payer: Self-pay | Admitting: Internal Medicine

## 2011-12-30 ENCOUNTER — Other Ambulatory Visit (INDEPENDENT_AMBULATORY_CARE_PROVIDER_SITE_OTHER): Payer: Medicare Other

## 2011-12-30 VITALS — BP 110/70 | HR 68 | Ht 64.0 in | Wt 200.1 lb

## 2011-12-30 DIAGNOSIS — K7689 Other specified diseases of liver: Secondary | ICD-10-CM

## 2011-12-30 DIAGNOSIS — D509 Iron deficiency anemia, unspecified: Secondary | ICD-10-CM

## 2011-12-30 DIAGNOSIS — K7460 Unspecified cirrhosis of liver: Secondary | ICD-10-CM

## 2011-12-30 DIAGNOSIS — K766 Portal hypertension: Secondary | ICD-10-CM

## 2011-12-30 DIAGNOSIS — K219 Gastro-esophageal reflux disease without esophagitis: Secondary | ICD-10-CM

## 2011-12-30 DIAGNOSIS — I8500 Esophageal varices without bleeding: Secondary | ICD-10-CM

## 2011-12-30 DIAGNOSIS — K7581 Nonalcoholic steatohepatitis (NASH): Secondary | ICD-10-CM

## 2011-12-30 LAB — CBC WITH DIFFERENTIAL/PLATELET
Basophils Absolute: 0 10*3/uL (ref 0.0–0.1)
Eosinophils Absolute: 0.2 10*3/uL (ref 0.0–0.7)
Hemoglobin: 12 g/dL (ref 12.0–15.0)
Lymphocytes Relative: 22.7 % (ref 12.0–46.0)
Lymphs Abs: 2.3 10*3/uL (ref 0.7–4.0)
Monocytes Relative: 5.2 % (ref 3.0–12.0)
Neutro Abs: 7.1 10*3/uL (ref 1.4–7.7)
Neutrophils Relative %: 70 % (ref 43.0–77.0)
Platelets: 135 10*3/uL — ABNORMAL LOW (ref 150.0–400.0)
RBC: 4.23 Mil/uL (ref 3.87–5.11)
RDW: 14.9 % — ABNORMAL HIGH (ref 11.5–14.6)

## 2011-12-30 LAB — PROTIME-INR: INR: 1.3 ratio — ABNORMAL HIGH (ref 0.8–1.0)

## 2011-12-30 NOTE — Patient Instructions (Addendum)
No allergy to soy or eggs. You have been given a separate informational sheet regarding your tobacco use, the importance of quitting and local resources to help you quit.   Your physician has requested that you go to the basement for lab work before leaving today:

## 2011-12-30 NOTE — Progress Notes (Signed)
HISTORY OF PRESENT ILLNESS:  Monica [name] is a 61 y.o. female with MULTIPLE medical problems including diabetes mellitus on insulin, coronary artery disease on Plavix, chronic morbid obesity, sleep apnea, GERD, anxiety on anxiolytics, hypertension, and irritable bowel syndrome. She is also status post appendectomy and cholecystectomy. She was initially seen in the office 03/18/2011 regarding newly diagnosed hepatic cirrhosis, chronic right-sided abdominal pain, chronic alternating bowel habits consistent with irritable bowel syndrome, and GERD. See that dictation for details. She has had multiple prior GI evaluations as previously outlined. 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 Additionally, right-sided musculoskeletal pain, chronic irritable bowel syndrome, and GERD. Multiple laboratories were obtained at that time. Supplemental studies did not support other causes for chronic liver disease. Iron saturation was low. Thyroid testing unremarkable. We reviewed her studies in detail.. She is again accompanied by her husband who participates during the encounter. She was last seen in the office January 2013. That month, she underwent screening upper endoscopy. This revealed grade 1 distal esophageal varices. Examination was otherwise unremarkable. She presents today for routine six-month followup. She continues with chronic right-sided pain without change. For her GERD she is maintained on Protonix. For the most part, good control of symptoms. She does have occasional mild dysphagia. Occasional symptomatic hemorrhoids without bleeding. Her last colonoscopy 2011 with lipoma. Since her last visit, she reports improved appetite. Unfortunately, she has gained about 20 pounds. She has had some problems with ankle edema. No abnormal swelling. No difficulty with mentation. Her GI bleeding. Her multiple chronic medical problems including sleep apnea, coronary artery  disease, diabetes mellitus, and hypertension have been stable.   REVIEW OF SYSTEMS:  All non-GI ROS negative except for headaches, neck pain, shoulder pain, anxiety, arthritis, cough, fatigue, visual change, itching, skin rash, ankle edema at times  Past Medical History  Diagnosis Date  . Type II or unspecified type diabetes mellitus without mention of complication, not stated as uncontrolled   . Coronary atherosclerosis of unspecified type of vessel, native or graft   . GERD (gastroesophageal reflux disease)   . Osteoarthrosis, unspecified whether generalized or localized, unspecified site   . Obesity, unspecified   . Macular degeneration (senile) of retina, unspecified   . Unspecified sleep apnea   . Irritable bowel syndrome   . Endometrial polyp   . Hypertension   . Hiatal hernia   . Esophageal varices     Past Surgical History  Procedure Date  . Appendectomy   . Cholecystectomy   . Carotid endarterectomy     Social History Maryellen NARY SNEED  reports that she has been smoking Cigarettes.  She has been smoking about 1 pack per day. She has never used smokeless tobacco. She reports that she does not drink alcohol or use illicit drugs.  family history includes Breast cancer in her mother and sister; Colon cancer in her paternal aunt; Diabetes in her brother, father, and sister; and Stomach cancer in her paternal aunt.  Allergies  Allergen Reactions  . Clarithromycin   . Latex   . Penicillins   . Sulfonamide Derivatives        PHYSICAL EXAMINATION: Vital signs: BP 110/70  Pulse 68  Ht 5\' 4"  (1.626 m)  Wt 200 lb 2 oz (90.776 kg)  BMI 34.35 kg/m2  Constitutional: chronically ill-appearing, no acute distress. Obese Psychiatric: alert and oriented x3, cooperative Eyes: extraocular movements intact, anicteric, conjunctiva pink Mouth: oral pharynx moist, no lesions. Normal tongue hue Neck:  supple no lymphadenopathy Cardiovascular: heart regular rate and rhythm with  occasional irregular beat, no murmur Lungs: clear to auscultation bilaterally Abdomen: soft,obese, nontender, nondistended, no obvious ascites, no peritoneal signs, normal bowel sounds, no organomegaly. Prior surgical incisions well-healed Rectal:omitted Extremities: trace extremity edema bilaterally Skin: no lesions on visible extremities. Thinning of the hair Neuro: No focal deficits. No asterixis.   ASSESSMENT:  #1. Hepatic cirrhosis with portal hypertension. Mild thrombocytopenia, mildly increased prothrombin time, changes on CT scan. Most likely etiology is NASH. No evidence for decompensation. Most recent upper endoscopy January 2013 with grade 1 varices #2. GERD. Requires PPI for control #3. Chronic right-sided musculoskeletal pain. Ongoing #4. History of iron deficiency anemia #5. History of irritable bowel syndrome #6. Last colonoscopy 2011. No history of neoplasia #7. Multiple significant medical problems. Currently stable   PLAN:  #1. Again discussed NASH and hepatic cirrhosis with the patient and her husband. Recommended weight loss, and a graduated exercise regimen as allowed by her physical condition #2. Laboratories today including CBC, comprehensive metabolic panel, and prothrombin time #3. Advised to minimize sodium intake to avoid fluid accumulation #4. Reflux precautions #5. Continue Protonix for GERD #6. Followup CBC as above. Question need to resume iron #7. Routine office followup in 1 year. Set repeat surveillance upper endoscopy at that time #8. Routine surveillance colonoscopy 2021 #9. Resume general medical care with PCP

## 2011-12-31 ENCOUNTER — Other Ambulatory Visit: Payer: Self-pay

## 2011-12-31 DIAGNOSIS — E875 Hyperkalemia: Secondary | ICD-10-CM

## 2011-12-31 LAB — BASIC METABOLIC PANEL
CO2: 29 mEq/L (ref 19–32)
Calcium: 9.2 mg/dL (ref 8.4–10.5)
Chloride: 100 mEq/L (ref 96–112)
Creatinine, Ser: 1.2 mg/dL (ref 0.4–1.2)
Glucose, Bld: 301 mg/dL — ABNORMAL HIGH (ref 70–99)

## 2011-12-31 LAB — HEPATIC FUNCTION PANEL
ALT: 17 U/L (ref 0–35)
Albumin: 3.4 g/dL — ABNORMAL LOW (ref 3.5–5.2)
Alkaline Phosphatase: 128 U/L — ABNORMAL HIGH (ref 39–117)
Total Protein: 7.1 g/dL (ref 6.0–8.3)

## 2012-01-06 ENCOUNTER — Telehealth: Payer: Self-pay

## 2012-01-06 NOTE — Telephone Encounter (Signed)
Patient had called asking if she could have the repeat BMET Dr. Marina Goodell requested at her previous o/v on 8-13 done on 01-30-12 at appointment with her pcp.  After speaking with Dr. Marina Goodell I told her she really needed to come on in, that elevated K could be potentially serious.  Patient agreed to come in tomorrow, 8-21, to have BMET done.

## 2012-01-08 ENCOUNTER — Other Ambulatory Visit (INDEPENDENT_AMBULATORY_CARE_PROVIDER_SITE_OTHER): Payer: Medicare Other

## 2012-01-08 DIAGNOSIS — E875 Hyperkalemia: Secondary | ICD-10-CM

## 2012-01-08 LAB — BASIC METABOLIC PANEL
BUN: 20 mg/dL (ref 6–23)
CO2: 27 mEq/L (ref 19–32)
Calcium: 8.7 mg/dL (ref 8.4–10.5)
Creatinine, Ser: 1.2 mg/dL (ref 0.4–1.2)

## 2012-08-05 ENCOUNTER — Other Ambulatory Visit: Payer: Self-pay | Admitting: Internal Medicine

## 2012-08-23 ENCOUNTER — Ambulatory Visit: Payer: Medicare Other | Admitting: Internal Medicine

## 2012-08-25 ENCOUNTER — Other Ambulatory Visit (HOSPITAL_COMMUNITY): Payer: Self-pay | Admitting: Cardiovascular Disease

## 2012-08-25 ENCOUNTER — Ambulatory Visit: Payer: Medicare Other | Admitting: Internal Medicine

## 2012-08-25 DIAGNOSIS — R079 Chest pain, unspecified: Secondary | ICD-10-CM

## 2012-09-02 ENCOUNTER — Ambulatory Visit (HOSPITAL_COMMUNITY)
Admission: RE | Admit: 2012-09-02 | Discharge: 2012-09-02 | Disposition: A | Discharge status: Home / Self Care | Payer: Medicare Other | Source: Ambulatory Visit | Attending: Cardiovascular Disease | Admitting: Cardiovascular Disease

## 2012-09-02 DIAGNOSIS — R079 Chest pain, unspecified: Secondary | ICD-10-CM

## 2012-09-02 HISTORY — DX: Chest pain, unspecified: R07.9

## 2012-09-02 MED ORDER — AMINOPHYLLINE 25 MG/ML IV SOLN
75.0000 mg | Freq: Once | INTRAVENOUS | Status: AC
  Administered 2012-09-02: 75 mg via INTRAVENOUS

## 2012-09-02 MED ORDER — TECHNETIUM TC 99M SESTAMIBI GENERIC - CARDIOLITE
30.1000 | Freq: Once | INTRAVENOUS | Status: AC | PRN
  Administered 2012-09-02: 30.1 via INTRAVENOUS

## 2012-09-02 MED ORDER — TECHNETIUM TC 99M SESTAMIBI GENERIC - CARDIOLITE
10.3000 | Freq: Once | INTRAVENOUS | Status: AC | PRN
  Administered 2012-09-02: 10 via INTRAVENOUS

## 2012-09-02 MED ORDER — REGADENOSON 0.4 MG/5ML IV SOLN
0.4000 mg | Freq: Once | INTRAVENOUS | Status: AC
  Administered 2012-09-02: 0.4 mg via INTRAVENOUS

## 2012-09-02 NOTE — Procedures (Addendum)
[facility] [address] [place] [address] [phone]  Cardiology Nuclear Med Study  Monica [name] is a 62 y.o. female     MRN : [id number]     DOB: 07/02/1950  Procedure Date: 09/02/2012  Nuclear Med Background Indication for Stress Test:  Evaluation for Ischemia History:  Asthma Cardiac Risk Factors: Family History - CAD, Hypertension, IDDM Type 2, Lipids, Obesity, PVD and Smoker  Symptoms:  Chest Pain, Dizziness, DOE, Fatigue, Palpitations and SOB   Nuclear Pre-Procedure Caffeine/Decaff Intake:  12:00am NPO After: 10 AM   IV Site: R Forearm  IV 0.9% NS with Angio Cath:  22g  Chest Size (in):  N/A IV Started by: Emmit Pomfret, RN  Height: 5\' 5"  (1.651 m)  Cup Size: D  BMI:  Body mass index is 33.12 kg/(m^2). Weight:  199 lb (90.266 kg)   Tech Comments:  N/A    Nuclear Med Study 1 or 2 day study: 1 day  Stress Test Type:  Lexiscan  Order Authorizing Provider:  Nanetta Batty, MD   Resting Radionuclide: Technetium 35m Sestamibi  Resting Radionuclide Dose: 10.3 mCi   Stress Radionuclide:  Technetium 90m Sestamibi  Stress Radionuclide Dose: 30.1 mCi           Stress Protocol Rest HR: 61 Stress HR: 81  Rest BP: 149/75 Stress BP: 131/60  Exercise Time (min): n/a METS: n/a   Predicted Max HR: 158 bpm % Max HR: 51.27 bpm Rate Pressure Product: 21308  Dose of Adenosine (mg):  n/a Dose of Lexiscan: 0.4 mg  Dose of Atropine (mg): n/a Dose of Dobutamine: n/a mcg/kg/min (at max HR)  Stress Test Technologist: Esperanza Sheets, CCT Nuclear Technologist: Gonzella Lex, CNMT   Rest Procedure:  Myocardial perfusion imaging was performed at rest 45 minutes following the intravenous administration of Technetium 46m Sestamibi. Stress Procedure:  The patient received IV Lexiscan 0.4 mg over 15 seconds. Technetium 78m Sestamibi injected at 30 seconds. The patient experienced SOB, Nausea and HA and was given 75 mg of IV  Aminophylline with resolution of symptoms.  There were no significant changes with Lexiscan.  Quantitative spect images were obtained after 45 minute delay.     Transient Ischemic Dilatation (Normal <1.22):  1.12 Lung/Heart Ratio (Normal <0.45):  0.31 QGS EDV:  81 ml QGS ESV:  27 ml LV Ejection Fraction: 67%  Rest ECG: NSR - Normal EKG  Stress ECG: No significant change from baseline ECG  QPS Raw Data Images:  Normal; no motion artifact; normal heart/lung ratio. Stress Images:  Normal homogeneous uptake in all areas of the myocardium. Rest Images:  Normal homogeneous uptake in all areas of the myocardium. Subtraction (SDS):  No evidence of ischemia.  Impression Exercise Capacity:  Lexiscan with no exercise. BP Response:  Normal blood pressure response. Clinical Symptoms:  No significant symptoms noted. ECG Impression:  No significant ST segment change suggestive of ischemia. Comparison with Prior Nuclear Study: Prior study in 2011 showed breast attenuation artifact, which is not present in this study.  Overall Impression:  Low risk stress nuclear study.   No ischemia.  LV Wall Motion:  NL LV Function; NL Wall Motion; EF 67%.  Chrystie Nose, MD, [facility] Board Certified in Nuclear Cardiology Attending Cardiologist [facility]   Chrystie Nose, MD  09/02/2012 5:15 PM

## 2012-09-20 ENCOUNTER — Encounter: Payer: Self-pay | Admitting: Cardiovascular Disease

## 2012-09-21 ENCOUNTER — Other Ambulatory Visit: Payer: Medicare Other

## 2012-09-21 ENCOUNTER — Ambulatory Visit
Admission: RE | Admit: 2012-09-21 | Discharge: 2012-09-21 | Disposition: A | Discharge status: Home / Self Care | Payer: Medicare Other | Source: Ambulatory Visit | Attending: Internal Medicine | Admitting: Internal Medicine

## 2012-09-21 ENCOUNTER — Ambulatory Visit: Payer: Medicare Other

## 2012-09-21 ENCOUNTER — Other Ambulatory Visit: Payer: Self-pay | Admitting: Obstetrics and Gynecology

## 2012-09-21 DIAGNOSIS — Z1231 Encounter for screening mammogram for malignant neoplasm of breast: Secondary | ICD-10-CM

## 2012-09-21 DIAGNOSIS — M810 Age-related osteoporosis without current pathological fracture: Secondary | ICD-10-CM

## 2012-09-27 ENCOUNTER — Ambulatory Visit (INDEPENDENT_AMBULATORY_CARE_PROVIDER_SITE_OTHER): Payer: Medicare Other | Admitting: Internal Medicine

## 2012-09-27 ENCOUNTER — Encounter: Payer: Self-pay | Admitting: Internal Medicine

## 2012-09-27 VITALS — BP 134/60 | HR 88 | Ht 64.75 in | Wt 199.2 lb

## 2012-09-27 DIAGNOSIS — R1310 Dysphagia, unspecified: Secondary | ICD-10-CM

## 2012-09-27 DIAGNOSIS — K7460 Unspecified cirrhosis of liver: Secondary | ICD-10-CM

## 2012-09-27 DIAGNOSIS — I8500 Esophageal varices without bleeding: Secondary | ICD-10-CM

## 2012-09-27 DIAGNOSIS — K219 Gastro-esophageal reflux disease without esophagitis: Secondary | ICD-10-CM

## 2012-09-27 DIAGNOSIS — K766 Portal hypertension: Secondary | ICD-10-CM

## 2012-09-27 NOTE — Progress Notes (Signed)
HISTORY OF PRESENT ILLNESS:  Monica [name] is a 62 y.o. female with MULTIPLE significant medical problems including diabetes mellitus on insulin, coronary artery disease and carotid artery disease on Plavix, chronic morbid obesity, sleep apnea, GERD, anxiety an anxiolytics, hypertension, irritable bowel syndrome, chronic right-sided abdominal pain felt musculoskeletal, hepatic cirrhosis secondary to NASH, and status post appendectomy and cholecystectomy. The patient is accompanied by her husband. She did undergo routine surveillance colonoscopy in 2011. She presents today with a new complaint of mild intermittent solid food dysphagia. She is edentulous. Her last upper endoscopy in January 2013 revealed grade 1 varices but was otherwise normal. She did have a hiatal hernia. Patient denies weight loss. She continues with chronic right-sided pain unchanged. She takes PPI for GERD. This helps. 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 Type II or unspecified type diabetes mellitus without mention of complication, not stated as uncontrolled   . Coronary atherosclerosis of unspecified type of vessel, native or graft   . GERD (gastroesophageal reflux disease)   . Osteoarthrosis, unspecified whether generalized or localized, unspecified site   . Obesity, unspecified   . Macular degeneration (senile) of retina, unspecified   . Unspecified sleep apnea   . Irritable bowel syndrome   . Endometrial polyp   . Hypertension   . Hiatal hernia   . Esophageal varices   . Iron deficiency anemia   . 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  Hyperlipidemia   . SOB (shortness of breath)     Normal 2D echo - EF-58, 03/18/2001  . Chest pain 09/02/2012    Normal, EF-67  . Carotid artery bruit     Doppler 07/09/2011 - abnormal study, no change from previous    Past Surgical History  Procedure Laterality Date  . Appendectomy    . Cholecystectomy    . Carotid endarterectomy    . Cardiac catheterization  03/18/2007    Normal coronary arteries and LV function  . Cardiac catheterization  07/01/2001    Normal cath  . Cardiac catheterization  05/17/1997    Normal cath    Social History Monica [name]  reports that she has been smoking Cigarettes.  She has been smoking about 1.00 pack per day. She has never used smokeless tobacco. She reports that she does not drink alcohol or use illicit drugs.  family history includes Breast cancer in her mother and sister; Colon cancer in her paternal aunt; Diabetes in her brother, father, and sister; and Stomach cancer in her paternal aunt.  Allergies  Allergen Reactions  . Augmentin (Amoxicillin-Pot Clavulanate)   . Biaxin (Clarithromycin)   . Latex   . Penicillins   . Sulfonamide Derivatives        PHYSICAL EXAMINATION: Vital signs: BP 134/60  Pulse 88  Ht 5' 4.75" (1.645 m)  Wt 199 lb 4 oz (90.379 kg)  BMI 33.4 kg/m2 General: Obese, unhealthy appearing, well-nourished, no acute distress HEENT: Sclerae are anicteric, conjunctiva pink. Oral mucosa intact Lungs: Clear Heart: Regular Abdomen: soft, obese, tender abdominal wall right midabdomen, nondistended, no obvious ascites, no peritoneal signs, normal bowel sounds. No organomegaly. Multiple prior surgical incisions well-healed Extremities: No  edema Psychiatric: alert and oriented x3. Cooperative. No asterixis    ASSESSMENT:  #1. Mild intermittent solid food dysphagia. Likely subtle stricture not appreciated on previous endoscopy #2. GERD. Symptoms controlled on PPI #3. Hepatic cirrhosis secondary NASH with portal  hypertension as manifested by mild thrombocytopenia, mildly increased prothrombin time, and small varices on endoscopy. #4. Chronic right-sided musculoskeletal pain. Ongoing #5. IBS #6. Last colonoscopy 2011. No neoplasia #7. Multiple medical problems. Stable   PLAN:  #1. Continue PPI #2. Continue reflux precautions #3. We discussed prospects of esophageal dilation. She would be her diabetic medications managed and discontinuation of Plavix with permission from her cardiologist Dr. Allyson Sabal. Patient does not think her swallowing problem this to the point of warranting dilation. She would like to see how she does over the next several months. She is due for routine surveillance upper endoscopy regarding her varices later this summer. She will plan on keeping that appointment but contact the office in the interim should she feel that her swallowing may require therapeutic endoscopy. #4. Exercise and weight loss #5. Routine surveillance colonoscopy 2021 #6. Resume general medical care with PCP, Dr. Ardelle Park

## 2012-09-27 NOTE — Patient Instructions (Addendum)
You have been given a separate informational sheet regarding your tobacco use, the importance of quitting and local resources to help you quit.  Please follow up with Dr. Marina Goodell in a few months

## 2012-09-28 ENCOUNTER — Telehealth: Payer: Self-pay | Admitting: Cardiovascular Disease

## 2012-09-28 NOTE — Telephone Encounter (Signed)
Called [email or web address] about rescheduling her appointment that was cancel on 09/27/12.

## 2012-12-01 ENCOUNTER — Ambulatory Visit: Payer: Medicare Other | Admitting: Internal Medicine

## 2013-01-03 ENCOUNTER — Ambulatory Visit: Payer: Medicare Other | Admitting: Internal Medicine

## 2013-01-25 ENCOUNTER — Ambulatory Visit (INDEPENDENT_AMBULATORY_CARE_PROVIDER_SITE_OTHER): Payer: Medicare Other | Admitting: Internal Medicine

## 2013-01-25 ENCOUNTER — Encounter: Payer: Self-pay | Admitting: Internal Medicine

## 2013-01-25 VITALS — BP 124/66 | HR 64 | Ht 64.75 in | Wt 190.0 lb

## 2013-01-25 DIAGNOSIS — K7689 Other specified diseases of liver: Secondary | ICD-10-CM

## 2013-01-25 DIAGNOSIS — R197 Diarrhea, unspecified: Secondary | ICD-10-CM

## 2013-01-25 DIAGNOSIS — K7460 Unspecified cirrhosis of liver: Secondary | ICD-10-CM

## 2013-01-25 DIAGNOSIS — K766 Portal hypertension: Secondary | ICD-10-CM

## 2013-01-25 DIAGNOSIS — K219 Gastro-esophageal reflux disease without esophagitis: Secondary | ICD-10-CM

## 2013-01-25 DIAGNOSIS — K7581 Nonalcoholic steatohepatitis (NASH): Secondary | ICD-10-CM

## 2013-01-25 DIAGNOSIS — R1310 Dysphagia, unspecified: Secondary | ICD-10-CM

## 2013-01-25 NOTE — Progress Notes (Signed)
HISTORY OF PRESENT ILLNESS:  Monica [name] is a 62 y.o. female with MULTIPLE significant medical problems including diabetes mellitus, coronary artery disease, carotid artery disease on Plavix, chronic morbid obesity, sleep apnea, GERD, anxiolytics for anxiety, hypertension, irritable bowel, chronic right-sided muscular skeletal abdominal pain, hepatic cirrhosis secondary NASH, status post appendectomy and cholecystectomy. Patient presents today with chief complaint of diarrhea and abdominal pain. She is accompanied by her husband. She was last evaluated 09/28/1998 chain regarding mild intermittent solid food dysphagia. See that dictation. Her dysphagia is about the same. She has not obtained dentures. She complains today of her same chronic right-sided pain that she has had decades. No change. She also reports significant diarrhea with the introduction of metformin. This occurred back in May. She does tell me that she had a CT scan of her abdomen this summer to evaluate abdominal pain. Apparently no acute findings. Cirrhosis noted. Her weight is down about 9 pounds since her last visit. Still with occasional GERD symptoms including regurgitation and nausea. She takes Protonix 20 mg daily. Her last colonoscopy in 2011 was negative for neoplasia.  REVIEW OF SYSTEMS:  All non-GI ROS negative except for fatigue  Past Medical History  Diagnosis Date  . Type II or unspecified type diabetes mellitus without mention of complication, not stated as uncontrolled   . Coronary atherosclerosis of unspecified type of vessel, native or graft   . GERD (gastroesophageal reflux disease)   . Osteoarthrosis, unspecified whether generalized or localized, unspecified site   . Obesity, unspecified   . Macular degeneration (senile) of retina, unspecified   . Unspecified sleep apnea   . Irritable bowel syndrome   . Endometrial polyp   . Hypertension   . Hiatal hernia   . Esophageal varices   . Iron deficiency anemia    . 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 Hyperlipidemia   . SOB (shortness of breath)     Normal 2D echo - EF-58, 03/18/2001  . Chest pain 09/02/2012    Normal, EF-67  . Carotid artery bruit     Doppler 07/09/2011 - abnormal study, no change from previous    Past Surgical History  Procedure Laterality Date  . Appendectomy    . Cholecystectomy    . Carotid endarterectomy    . Cardiac catheterization  03/18/2007    Normal coronary arteries and LV function  . Cardiac catheterization  07/01/2001    Normal cath  . Cardiac catheterization  05/17/1997    Normal cath    Social History Monica [name]  reports that she has been smoking Cigarettes.  She has been smoking about 1.00 pack per day. She has never used smokeless tobacco. She reports that she does not drink alcohol or use illicit drugs.  family history includes Breast cancer in her mother and sister; Colon cancer in her paternal aunt; Diabetes in her brother, father, and sister; Stomach cancer in her paternal aunt.  Allergies  Allergen Reactions  . Augmentin [Amoxicillin-Pot Clavulanate]   . Biaxin [Clarithromycin]   . Latex   . Penicillins   . Sulfonamide Derivatives        PHYSICAL EXAMINATION: Vital signs: BP 124/66  Pulse 64  Ht 5' 4.75" (1.645 m)  Wt 190 lb (86.183 kg)  BMI 31.85 kg/m2  Constitutional: generally well-appearing, no acute distress Psychiatric: alert and oriented x3, cooperative Eyes: extraocular movements intact, anicteric, conjunctiva pink Mouth: oral pharynx moist, no  lesions Neck: supple no lymphadenopathy Cardiovascular: heart regular rate and rhythm, no murmur Lungs: clear to auscultation bilaterally Abdomen: soft, obese, tenderness in the right upper quadrant abdominal wall and rib cage , nondistended, no obvious ascites, no peritoneal signs, normal bowel sounds, no organomegaly. Prior surgical incision well-healed Rectal:  Omitted Extremities: no lower extremity edema bilaterally Skin: no lesions on visible extremities Neuro: No focal deficits. No asterixis.    ASSESSMENT:  #1. Chronic right-sided abdominal discomfort. Musculoskeletal. #2. Hepatic cirrhosis secondary to NASH with portal hypertension. Compensated #3. Diarrhea. Most likely secondary to metformin. She has had problems with diarrhea secondary to metformin in the past #4. IBS #5. Last colonoscopy 2011 without neoplasia #6. GERD. Stable on PPI #7. Mild intermittent dysphagia. Ongoing, but no worse. Still needs dentures   PLAN:  #1. See PCP regarding and is no musculoskeletal pain #2. See PCP regarding alternative to metformin to see if this helps with her diarrhea #3. Continue reflux precautions and PPI #4. Routine GI office followup in 6 months. Continue with exercise and weight loss for [place] #5. Surveillance colonoscopy 2021

## 2013-01-25 NOTE — Patient Instructions (Addendum)
Please follow up with Dr. Perry in 6 months 

## 2013-02-24 ENCOUNTER — Other Ambulatory Visit: Payer: Self-pay | Admitting: *Deleted

## 2013-02-24 ENCOUNTER — Encounter: Payer: Self-pay | Admitting: Endocrinology

## 2013-02-24 ENCOUNTER — Ambulatory Visit (INDEPENDENT_AMBULATORY_CARE_PROVIDER_SITE_OTHER): Payer: Medicare Other | Admitting: Endocrinology

## 2013-02-24 VITALS — BP 130/78 | HR 68 | Temp 98.5°F | Resp 12 | Ht 65.0 in | Wt 184.1 lb

## 2013-02-24 DIAGNOSIS — R634 Abnormal weight loss: Secondary | ICD-10-CM

## 2013-02-24 DIAGNOSIS — E039 Hypothyroidism, unspecified: Secondary | ICD-10-CM

## 2013-02-24 DIAGNOSIS — E785 Hyperlipidemia, unspecified: Secondary | ICD-10-CM

## 2013-02-24 DIAGNOSIS — E1149 Type 2 diabetes mellitus with other diabetic neurological complication: Secondary | ICD-10-CM

## 2013-02-24 DIAGNOSIS — E1142 Type 2 diabetes mellitus with diabetic polyneuropathy: Secondary | ICD-10-CM

## 2013-02-24 MED ORDER — INSULIN GLARGINE 100 UNIT/ML SOLOSTAR PEN: 30.0000 [IU] | PEN_INJECTOR | Freq: Two times a day (BID) | SUBCUTANEOUS | Status: DC

## 2013-02-24 MED ORDER — INSULIN LISPRO 100 UNIT/ML (KWIKPEN): 20.0000 [IU] | PEN_INJECTOR | Freq: Three times a day (TID) | SUBCUTANEOUS | Status: DC

## 2013-02-24 NOTE — Patient Instructions (Signed)
Lantus 30 twice daily  Humalog 20 units before meals; extra 5 units if > 200 and extra 10 if > 300  Please check blood sugars at least half the time about 2 hours after any meal and as directed on waking up. Please bring blood sugar monitor to each visit  Stop Victoza

## 2013-02-24 NOTE — Progress Notes (Signed)
Reason for Appointment : Consultation for Type 2 Diabetes  History of Present Illness          Diagnosis: Type 2 diabetes mellitus, date of diagnosis: ? 2001        Past history:  She had been tried on oral agents for a year or 2 before putting her on insulin. She took Glucotrol, Glucophage, Janumet, Januvia and Avandia at various times.  She also has been on variety of insulin regimens. At some point was taking Lantus insulin, 90 units a day but not clear if she was taking mealtime insulin with this. Previously taking NPH + regular insulin and also 75/25 without good control. With the premixed insulin she was taking about 180 units a day She was doing relatively well with the regimen of Lantus and Humalog at mealtimes and her A1c had been down to 7.1 in 2011.  At that time her weight was about 250 pounds.Previously was on Lantus 42 b.i.d. Humalog at mealtimes: 26-26-28. Lantus stopped in 2012 or so and she was switched to premixed insulin but apparently had worsening control with this.   RECENT history: Her insulin was stopped by her PCP in 5/14 and she was switched to Victoza. Since then her blood sugars have been progressively higher and she has been losing weight. Also has had increased thirst and urination as well as fatigue and weakness. She was also given [place] which caused yeast infection and had the same experience with Invokana and these were stopped; blood sugars did not improve The last HbgA1c was reported as 10.1 about 3 months ago  Oral hypoglycemic drugs the patient is taking are: Metformin only      Side effects from medications have been: Candidiasis from Invokana, diarrhea from full dose metformin    Glucose monitoring:  done one-to times a day         Glucometer:  embrace.      Blood Glucose readings from recall:  280-440 at various times, does not keep a record      Hypoglycemia frequency: Never.           Self-care: The diet that the patient has been following is:  None     Meals: 3 meals per day.          Physical activity: exercise: Unable to do much.          Dietician visit: Most recent:.2011                   Lab Results  Component Value Date   HGBA1C 11.8* 02/24/2013    Filed Weights   02/24/13 1511  Weight: 184 lb 1.6 oz (83.507 kg)      Medication List       This list is accurate as of: 02/24/13 11:59 PM.  Always use your most recent med list.               alendronate 70 MG tablet  Commonly known as:  FOSAMAX  Take 70 mg by mouth every 7 (seven) days. Take with a full glass of water on an empty stomach.     ALPRAZolam 1 MG tablet  Commonly known as:  XANAX  Take 1 mg by mouth as needed.     clopidogrel 75 MG tablet  Commonly known as:  PLAVIX  Take 75 mg by mouth daily.     dicyclomine 20 MG tablet  Commonly known as:  BENTYL  Take 20 mg by mouth every  6 (six) hours.     diphenoxylate-atropine 2.5-0.025 MG per tablet  Commonly known as:  LOMOTIL  Take 1 tablet by mouth 4 (four) times daily as needed for diarrhea or loose stools.     furosemide 20 MG tablet  Commonly known as:  LASIX  Take 20 mg by mouth 2 (two) times daily as needed.     Hydrocodone-Acetaminophen 5-300 MG Tabs  Take 1 tablet every 6-8 hours as needed for pain     Insulin Glargine 100 UNIT/ML Sopn  Commonly known as:  LANTUS SOLOSTAR  Inject 30 Units into the skin 2 (two) times daily.     insulin lispro 100 UNIT/ML Sopn  Commonly known as:  HUMALOG KWIKPEN  Inject 20 Units into the skin 3 (three) times daily with meals.     levothyroxine 75 MCG tablet  Commonly known as:  SYNTHROID, LEVOTHROID  Take 75 mcg by mouth daily. Brand Name Only     metFORMIN 1000 MG tablet  Commonly known as:  GLUCOPHAGE  Take 1,000 mg by mouth 2 (two) times daily with a meal.     metoprolol 50 MG tablet  Commonly known as:  LOPRESSOR     metoprolol succinate 50 MG 24 hr tablet  Commonly known as:  TOPROL-XL  Take 50 mg by mouth daily.     multivitamin  tablet  Take 1 tablet by mouth daily.     pantoprazole 20 MG tablet  Commonly known as:  PROTONIX  Take 20 mg by mouth daily.     promethazine 25 MG tablet  Commonly known as:  PHENERGAN  Take 25 mg by mouth every 6 (six) hours as needed.     simvastatin 40 MG tablet  Commonly known as:  ZOCOR  Take 40 mg by mouth at bedtime.     traMADol 50 MG tablet  Commonly known as:  ULTRAM  Take 50 mg by mouth every 6 (six) hours as needed. Maximum dose= 8 tablets per day     VICTOZA [place]  Inject into the skin daily. 6-12 units     vitamin C 1000 MG tablet  Take 1,000 mg by mouth daily.        Allergies:  Allergies  Allergen Reactions  . Augmentin [Amoxicillin-Pot Clavulanate]   . Biaxin [Clarithromycin]   . Latex   . Penicillins   . Sulfonamide Derivatives     Past Medical History  Diagnosis Date  . Type II or unspecified type diabetes mellitus without mention of complication, not stated as uncontrolled   . Coronary atherosclerosis of unspecified type of vessel, native or graft   . GERD (gastroesophageal reflux disease)   . Osteoarthrosis, unspecified whether generalized or localized, unspecified site   . Obesity, unspecified   . Macular degeneration (senile) of retina, unspecified   . Unspecified sleep apnea   . Irritable bowel syndrome   . Endometrial polyp   . Hypertension   . Hiatal hernia   . Esophageal varices   . Iron deficiency anemia   . 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 Hyperlipidemia   . SOB (shortness of breath)     Normal 2D echo - EF-58, 03/18/2001  . Chest pain 09/02/2012    Normal, EF-67  . Carotid artery bruit     Doppler 07/09/2011 - abnormal study, no change from previous    Past Surgical History  Procedure Laterality Date  . Appendectomy    .  Cholecystectomy    . Carotid endarterectomy    . Cardiac catheterization  03/18/2007    Normal coronary arteries and LV function  .  Cardiac catheterization  07/01/2001    Normal cath  . Cardiac catheterization  05/17/1997    Normal cath    Family History  Problem Relation Age of Onset  . Breast cancer Mother   . Diabetes Father   . Breast cancer Sister   . Diabetes Sister   . Diabetes Brother   . Colon cancer Paternal Aunt   . Stomach cancer Paternal Aunt     Social History:  reports that she has been smoking Cigarettes.  She has been smoking about 1.00 pack per day. She has never used smokeless tobacco. She reports that she does not drink alcohol or use illicit drugs.    Review of Systems       Lipids: No recent labs available. Currently now on 40 mg Zocor      She has decreased vision in the left eye from macular degeneration  Thyroid: She has had a multinodular goiter. This has been evaluated previously with ultrasounds and also biopsy of left thyroid mass. Last ultrasound in 11/10 suggested that she had a soft tissue mass behind left lobe.     Also has hypothyroidism, mild with supplementation using 75 mcg levothyroxine              Skin: No rash or infections     The blood pressure has been controlled on metoprolol only     No swelling of feet recently, has had edema before.     No shortness of breath on exertion. She has had coronary artery disease, currently no chest pain.     Bowel habits: She has had tendency to diarrhea periodically, has known irritable bowel syndrome Also complaining about left upper abdominal discomfort. Has a history of GERD, taking Protonix. Occasionally may have nausea     GI: She has had Cirrhosis from [facility], followed by gastroenterologist         No frequency of urination or nocturia       .She has had joint and back pains from osteoarthritis. She has been on Fosamax for osteoporosis.      No  depression, Has history of anxiety          She has had Numbness in her feet  She has evidence of neuropathy with sensory loss previously     LABS:  Office Visit on  02/24/2013  Component Date Value Range Status  . Hemoglobin A1C 02/24/2013 11.8* 4.6 - 6.5 % Final   Glycemic Control Guidelines for People with Diabetes:Non Diabetic:  <6%Goal of Therapy: <7%Additional Action Suggested:  >8%   . TSH 02/24/2013 1.20  0.35 - 5.50 uIU/mL Final  . Free T4 02/24/2013 1.22  0.60 - 1.60 ng/dL Final  . Sodium 16/02/9603 134* 135 - 145 mEq/L Final  . Potassium 02/24/2013 4.9  3.5 - 5.1 mEq/L Final  . Chloride 02/24/2013 101  96 - 112 mEq/L Final  . CO2 02/24/2013 24  19 - 32 mEq/L Final  . Glucose, Bld 02/24/2013 418* 70 - 99 mg/dL Final  . BUN 54/01/8118 14  6 - 23 mg/dL Final  . Creatinine, Ser 02/24/2013 1.3* 0.4 - 1.2 mg/dL Final  . Total Bilirubin 02/24/2013 0.4  0.3 - 1.2 mg/dL Final  . Alkaline Phosphatase 02/24/2013 98  39 - 117 U/L Final  . AST 02/24/2013 14  0 - 37 U/L Final  .  ALT 02/24/2013 10  0 - 35 U/L Final  . Total Protein 02/24/2013 7.2  6.0 - 8.3 g/dL Final  . Albumin 40/98/1191 3.6  3.5 - 5.2 g/dL Final  . Calcium 47/82/9562 8.8  8.4 - 10.5 mg/dL Final  . GFR 13/12/6576 43.54* >60.00 mL/min Final    Physical Examination:  BP 130/78  Pulse 68  Temp(Src) 98.5 F (36.9 C)  Resp 12  Ht 5\' 5"  (1.651 m)  Wt 184 lb 1.6 oz (83.507 kg)  BMI 30.64 kg/m2  SpO2 98%  GENERAL:         Patient has generalized obesity.   HEENT:         Eye exam shows normal external appearance. Fundus exam shows no retinopathy. Scarring present in the left central fundus  Oral exam shows normal mucosa .  NECK:         General:  Neck exam shows no lymphadenopathy. Carotids are normal to palpation and no bruit heard.  Thyroid is not enlarged and no nodules felt.   LUNGS:         Chest is symmetrical. Lungs are clear to auscultation.Marland Kitchen   HEART:         Heart sounds:  S1 and S2 are normal. No murmurs or clicks heard., no S3 or S4.   ABDOMEN:         General:  There is no distention present. Liver and spleen are not palpable. No other mass or tenderness present.   EXTREMITIES:     There is no edema. No skin lesions present.Marland Kitchen  NEUROLOGICAL:        Vibration sense is markedly reduced in toes. Ankle jerks are absent bilaterally.biceps reflexes are slightly brisk. No tremor present.           Diabetic foot exam:  as in the foot exam section MUSCULOSKELETAL:       There is no enlargement or deformity of the joints. Spine is normal to inspection.Marland Kitchen   PEDAL pulses: absent  SKIN:       No rash or lesions of concern.        ASSESSMENT:  Diabetes type 2, uncontrolled - 250.02    The patient has had long-standing diabetes with insulin resistance. Previously had been on large doses of insulin for control and had done fairly well with basal bolus regimen about 3-4 years ago. She has been on this insulin regimens subsequently which have not been effective Currently off insulin with marked hyperglycemia and has failed a trial of Victoza and SGLT-2 agents She has been symptomatic but hyperglycemia and has had a significant weight loss from various factors including hyperglycemia She does need to be back on insulin with or without metformin and she can stop Victoza as it has not been effective  Complications: Peripheral neuropathy, coronary artery disease,? 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 She is a candidate for an insulin sensitizer like Actos   PLAN:   .Insulin with Lantus 30 twice daily, discussed potentially increasing the dose by 5 units if fasting readings are still low her own and 50 in about a week Humalog 20 units before meals; extra 5 units if > 200 and extra 10 if > 300   She was instructed on a new Verio glucose monitor today. She will check blood sugars at least half the time about 2 hours after any  meal and daily on waking up. She will bring blood sugar monitor to each  visit for download   Stop Victoza, consider using metformin ER instead of regular metformin She is not able to exercise much but encouraged as much as she can  She will need considerable diabetes education including meal planning. Advised to have balanced meals with protein at each meal  Discussed foot care principles because of her neuropathy and sensory loss  Total visit time including counseling = 55 minutes  Desirie Minteer 02/26/2013, 12:31 PM

## 2013-02-25 ENCOUNTER — Telehealth: Payer: Self-pay | Admitting: *Deleted

## 2013-02-25 LAB — COMPREHENSIVE METABOLIC PANEL
ALT: 10 U/L (ref 0–35)
BUN: 14 mg/dL (ref 6–23)
CO2: 24 mEq/L (ref 19–32)
Calcium: 8.8 mg/dL (ref 8.4–10.5)
Creatinine, Ser: 1.3 mg/dL — ABNORMAL HIGH (ref 0.4–1.2)
GFR: 43.54 mL/min — ABNORMAL LOW (ref >60.00)
Total Bilirubin: 0.4 mg/dL (ref 0.3–1.2)

## 2013-02-25 LAB — TSH: TSH: 1.2 u[IU]/mL (ref 0.35–5.50)

## 2013-02-25 LAB — T4, FREE: Free T4: 1.22 ng/dL (ref 0.60–1.60)

## 2013-02-25 MED ORDER — INSULIN ASPART 100 UNIT/ML FLEXPEN: 20.0000 [IU] | PEN_INJECTOR | Freq: Three times a day (TID) | SUBCUTANEOUS | Status: DC

## 2013-02-25 NOTE — Progress Notes (Signed)
Quick Note:  Please let patient know that the thyroid result is normal and A1c 11.8 ______

## 2013-02-25 NOTE — Telephone Encounter (Signed)
Insurance prefers novolog instead of humalog, novolog was sent over to pharmacy.

## 2013-02-26 DIAGNOSIS — E785 Hyperlipidemia, unspecified: Secondary | ICD-10-CM | POA: Insufficient documentation

## 2013-02-26 DIAGNOSIS — E1142 Type 2 diabetes mellitus with diabetic polyneuropathy: Secondary | ICD-10-CM | POA: Insufficient documentation

## 2013-02-28 ENCOUNTER — Telehealth: Payer: Self-pay | Admitting: Endocrinology

## 2013-02-28 NOTE — Telephone Encounter (Signed)
Please call pt. Re: Diabetic testing supplies.

## 2013-03-03 ENCOUNTER — Telehealth: Payer: Self-pay | Admitting: *Deleted

## 2013-03-03 NOTE — Telephone Encounter (Signed)
Spoke with [place] with [facility], she wanted clarifications on patients novolog dosage.

## 2013-03-11 ENCOUNTER — Ambulatory Visit: Payer: Medicare Other | Admitting: Endocrinology

## 2013-03-22 ENCOUNTER — Other Ambulatory Visit: Payer: Self-pay | Admitting: *Deleted

## 2013-03-22 ENCOUNTER — Ambulatory Visit (INDEPENDENT_AMBULATORY_CARE_PROVIDER_SITE_OTHER): Payer: Medicare Other | Admitting: Endocrinology

## 2013-03-22 ENCOUNTER — Encounter: Payer: Self-pay | Admitting: Endocrinology

## 2013-03-22 VITALS — BP 122/72 | HR 69 | Temp 98.6°F | Resp 12 | Ht 65.75 in | Wt 200.0 lb

## 2013-03-22 DIAGNOSIS — E039 Hypothyroidism, unspecified: Secondary | ICD-10-CM

## 2013-03-22 DIAGNOSIS — E1149 Type 2 diabetes mellitus with other diabetic neurological complication: Secondary | ICD-10-CM

## 2013-03-22 DIAGNOSIS — Z23 Encounter for immunization: Secondary | ICD-10-CM

## 2013-03-22 DIAGNOSIS — K7460 Unspecified cirrhosis of liver: Secondary | ICD-10-CM

## 2013-03-22 DIAGNOSIS — E1142 Type 2 diabetes mellitus with diabetic polyneuropathy: Secondary | ICD-10-CM

## 2013-03-22 MED ORDER — INSULIN ASPART 100 UNIT/ML FLEXPEN: 20.0000 [IU] | PEN_INJECTOR | Freq: Three times a day (TID) | SUBCUTANEOUS | Status: DC

## 2013-03-22 NOTE — Patient Instructions (Signed)
TAKE 26 units Lanrus in am and 30 in pm Adjust pm dose of Lantus only if am sugar stays high or low  May take 15 Novolog at lunch unless eating larger meal

## 2013-03-22 NOTE — Telephone Encounter (Signed)
rx sent

## 2013-03-22 NOTE — Progress Notes (Signed)
Monica [name]   Reason for Appointment : Followup for Type 2 Diabetes  History of Present Illness          Diagnosis: Type 2 diabetes mellitus, date of diagnosis: ? 2001        Past history:  She had been tried on oral agents for a year or 2 before putting her on insulin. She took Glucotrol, Glucophage, Janumet, Januvia and Avandia at various times.  She also has been on variety of insulin regimens. At some point was taking Lantus insulin, 90 units a day but not clear if she was taking mealtime insulin with this. Previously taking NPH + regular insulin and also 75/25 without good control. With the premixed insulin she was taking about 180 units a day She was doing relatively well with the regimen of Lantus and Humalog at mealtimes and her A1c had been down to 7.1 in 2011.  At that time her weight was about 250 pounds.Previously was on Lantus 42 b.i.d. Humalog at mealtimes: 26-26-28. Lantus stopped in 2012 or so and she was switched to premixed insulin but apparently had worsening control with this. Her insulin was stopped by her PCP in 5/14 and she was switched to Victoza with marked increase in her blood sugar subsequently. She was also tried on Invokana which caused candidiasis and did not improve blood sugars  RECENT history:  Since starting back on her insulin basal bolus insulin regimen her blood sugars have been improving significantly. She has also been feeling better Her blood sugars do fluctuate significantly especially at breakfast and suppertime Although she is trying to check sugars at various times with her new monitor she is asking for more strips to check them 3-4 times a day  She now says that she is adjusting her Lantus and NovoLog based on the blood sugar at the time of injection The last HbgA1c was reported as 10.1 about 3 months ago INSULIN: LANTUS 30 BID; Novolog 15-20 a.c. Oral hypoglycemic drugs the patient is taking are: Metformin only      Side effects from medications  have been: Candidiasis from Invokana, diarrhea from full dose metformin    Glucose monitoring:  done 2.6 times a day         Glucometer:  One Touch Verio.      Blood Glucose readings from recall:  Morning sugars average 160 with a range of 85-228, midday median 115, suppertime same and after 7 PM median 145 with range 92-263    Hypoglycemia frequency:  rare with only one reading of 66 at supper         Self-care: The diet that the patient has been following is: None     Meals: 3 meals per day.          Physical activity: exercise: Unable to do much.          Dietician visit: Most recent:.2011                   Lab Results  Component Value Date   HGBA1C 11.8* 02/24/2013    Filed Weights   03/22/13 1316  Weight: 200 lb (90.719 kg)      Medication List       This list is accurate as of: 03/22/13  1:47 PM.  Always use your most recent med list.               alendronate 70 MG tablet  Commonly known as:  FOSAMAX  Take 70  mg by mouth every 7 (seven) days. Take with a full glass of water on an empty stomach.     ALPRAZolam 1 MG tablet  Commonly known as:  XANAX  Take 1 mg by mouth as needed.     clopidogrel 75 MG tablet  Commonly known as:  PLAVIX  Take 75 mg by mouth daily.     dicyclomine 20 MG tablet  Commonly known as:  BENTYL  Take 20 mg by mouth every 6 (six) hours.     diphenoxylate-atropine 2.5-0.025 MG per tablet  Commonly known as:  LOMOTIL  Take 1 tablet by mouth 4 (four) times daily as needed for diarrhea or loose stools.     furosemide 20 MG tablet  Commonly known as:  LASIX  Take 20 mg by mouth 2 (two) times daily as needed.     Hydrocodone-Acetaminophen 5-300 MG Tabs  Take 1 tablet every 6-8 hours as needed for pain     insulin aspart 100 UNIT/ML Sopn FlexPen  Commonly known as:  NOVOLOG FLEXPEN  Inject 20 Units into the skin 3 (three) times daily with meals.     Insulin Glargine 100 UNIT/ML Sopn  Commonly known as:  LANTUS SOLOSTAR  Inject 30  Units into the skin 2 (two) times daily.     levothyroxine 75 MCG tablet  Commonly known as:  SYNTHROID, LEVOTHROID  Take 75 mcg by mouth daily. Brand Name Only     metFORMIN 1000 MG tablet  Commonly known as:  GLUCOPHAGE  Take 1,000 mg by mouth 2 (two) times daily with a meal.     metoprolol 50 MG tablet  Commonly known as:  LOPRESSOR     metoprolol succinate 50 MG 24 hr tablet  Commonly known as:  TOPROL-XL  Take 50 mg by mouth daily.     multivitamin tablet  Take 1 tablet by mouth daily.     oxyCODONE 5 MG immediate release tablet  Commonly known as:  Oxy IR/ROXICODONE  5 mg.     pantoprazole 20 MG tablet  Commonly known as:  PROTONIX  Take 20 mg by mouth daily.     promethazine 25 MG tablet  Commonly known as:  PHENERGAN  Take 25 mg by mouth every 6 (six) hours as needed.     simvastatin 40 MG tablet  Commonly known as:  ZOCOR  Take 40 mg by mouth at bedtime.     traMADol 50 MG tablet  Commonly known as:  ULTRAM  Take 50 mg by mouth every 6 (six) hours as needed. Maximum dose= 8 tablets per day     VICTOZA [place]  Inject into the skin daily. 6-12 units     vitamin C 1000 MG tablet  Take 1,000 mg by mouth daily.        Allergies:  Allergies  Allergen Reactions  . Augmentin [Amoxicillin-Pot Clavulanate]   . Biaxin [Clarithromycin]   . Latex   . Penicillins   . Sulfonamide Derivatives     Past Medical History  Diagnosis Date  . Type II or unspecified type diabetes mellitus without mention of complication, not stated as uncontrolled   . Coronary atherosclerosis of unspecified type of vessel, native or graft   . GERD (gastroesophageal reflux disease)   . Osteoarthrosis, unspecified whether generalized or localized, unspecified site   . Obesity, unspecified   . Macular degeneration (senile) of retina, unspecified   . Unspecified sleep apnea   . Irritable bowel syndrome   . Endometrial polyp   .  Hypertension   . Hiatal hernia   . Esophageal varices    . Iron deficiency anemia   . 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 Hyperlipidemia   . SOB (shortness of breath)     Normal 2D echo - EF-58, 03/18/2001  . Chest pain 09/02/2012    Normal, EF-67  . Carotid artery bruit     Doppler 07/09/2011 - abnormal study, no change from previous    Past Surgical History  Procedure Laterality Date  . Appendectomy    . Cholecystectomy    . Carotid endarterectomy    . Cardiac catheterization  03/18/2007    Normal coronary arteries and LV function  . Cardiac catheterization  07/01/2001    Normal cath  . Cardiac catheterization  05/17/1997    Normal cath    Family History  Problem Relation Age of Onset  . Breast cancer Mother   . Diabetes Father   . Breast cancer Sister   . Diabetes Sister   . Diabetes Brother   . Colon cancer Paternal Aunt   . Stomach cancer Paternal Aunt     Social History:  reports that she has been smoking Cigarettes.  She has been smoking about 1.00 pack per day. She has never used smokeless tobacco. She reports that she does not drink alcohol or use illicit drugs.    Review of Systems       Lipids: No recent labs available. Currently now on 40 mg Zocor      She has decreased vision in the left eye from macular degeneration     She has had a multinodular goiter. This has been evaluated previously with ultrasounds and also biopsy of left thyroid mass. Last ultrasound in 11/10 suggested that she had a soft tissue mass behind left lobe.       HYPOTHYROIDISM: Recently normal thyroid levels with supplementation using 75 mcg levothyroxine              GI: She has had Cirrhosis from [place], followed by gastroenterologist            She has had Numbness in her feet  She has evidence of neuropathy with sensory loss previously    Took Lasix for swelling   LABS:  No visits with results within 1 Week(s) from this visit. Latest known visit with results  is:  Office Visit on 02/24/2013  Component Date Value Range Status  . Hemoglobin A1C 02/24/2013 11.8* 4.6 - 6.5 % Final   Glycemic Control Guidelines for People with Diabetes:Non Diabetic:  <6%Goal of Therapy: <7%Additional Action Suggested:  >8%   . TSH 02/24/2013 1.20  0.35 - 5.50 uIU/mL Final  . Free T4 02/24/2013 1.22  0.60 - 1.60 ng/dL Final  . Sodium 11/91/4782 134* 135 - 145 mEq/L Final  . Potassium 02/24/2013 4.9  3.5 - 5.1 mEq/L Final  . Chloride 02/24/2013 101  96 - 112 mEq/L Final  . CO2 02/24/2013 24  19 - 32 mEq/L Final  . Glucose, Bld 02/24/2013 418* 70 - 99 mg/dL Final  . BUN 95/62/1308 14  6 - 23 mg/dL Final  . Creatinine, Ser 02/24/2013 1.3* 0.4 - 1.2 mg/dL Final  . Total Bilirubin 02/24/2013 0.4  0.3 - 1.2 mg/dL Final  . Alkaline Phosphatase 02/24/2013 98  39 - 117 U/L Final  . AST 02/24/2013 14  0 - 37 U/L Final  . ALT 02/24/2013 10  0 -  35 U/L Final  . Total Protein 02/24/2013 7.2  6.0 - 8.3 g/dL Final  . Albumin 16/02/9603 3.6  3.5 - 5.2 g/dL Final  . Calcium 54/01/8118 8.8  8.4 - 10.5 mg/dL Final  . GFR 14/78/2956 43.54* >60.00 mL/min Final    Physical Examination:  BP 122/72  Pulse 69  Temp(Src) 98.6 F (37 C)  Resp 12  Ht 5' 5.75" (1.67 m)  Wt 200 lb (90.719 kg)  BMI 32.53 kg/m2  SpO2 97%  Trace ankle edema  ASSESSMENT:  Diabetes type 2, uncontrolled - 250.02    The patient has had long-standing diabetes with insulin resistance. She is doing significantly better with getting back on her basal bolus insulin regimen Currently has significant fluctuation in blood sugars probably because of inconsistent diet and adjusting insulin doses arbitrarily based on blood sugar readings rather than food intake or blood sugar trend She is also adjusting her Lantus at the time of injection based on glucose Generally sugars are lower at lunch and supper and higher in the morning and night Discussed blood sugar targets However she has gained back a significant  amount of weight with restarting insulin and improved glucose control  Hepatic steatosis 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 She is a candidate for an insulin sensitizer like Actos  Complications: Peripheral neuropathy, coronary artery disease,? Peripheral vascular disease  PLAN:   .Insulin will be adjusted as follows TAKE 26 units Lanrus in am and 30 in pm Adjust pm dose of Lantus only if am sugar stays high or low May take 15 Novolog at lunch unless eating larger meal and for this take 20 units Potentially may need to Victoza in addition to insulin if she continues to gain weight  She is not able to exercise much but encouraged as much as she can  She will need  diabetes education including information on insulin adjustment, carbohydrate counting and meal planning. Discussed balanced meals and she will be scheduled to see and nurse educator on her next visit  Counseling time over 50% of today's 25 minute visit  Monica [name] 03/22/2013, 1:47 PM

## 2013-03-23 DIAGNOSIS — K7460 Unspecified cirrhosis of liver: Secondary | ICD-10-CM | POA: Insufficient documentation

## 2013-05-04 ENCOUNTER — Ambulatory Visit: Payer: Medicare Other | Admitting: Endocrinology

## 2013-05-04 ENCOUNTER — Other Ambulatory Visit: Payer: Medicare Other

## 2013-05-25 ENCOUNTER — Other Ambulatory Visit: Payer: Medicare Other

## 2013-05-25 ENCOUNTER — Ambulatory Visit: Payer: Medicare Other | Admitting: Endocrinology

## 2013-06-21 ENCOUNTER — Other Ambulatory Visit (HOSPITAL_COMMUNITY): Payer: Self-pay | Admitting: Cardiovascular Disease

## 2013-06-21 DIAGNOSIS — I6529 Occlusion and stenosis of unspecified carotid artery: Secondary | ICD-10-CM

## 2013-06-24 ENCOUNTER — Encounter (HOSPITAL_COMMUNITY): Payer: Medicare Other

## 2013-07-21 ENCOUNTER — Encounter (HOSPITAL_COMMUNITY): Payer: Medicare Other

## 2013-07-25 ENCOUNTER — Ambulatory Visit (HOSPITAL_COMMUNITY)
Admission: RE | Admit: 2013-07-25 | Discharge: 2013-07-25 | Disposition: A | Discharge status: Home / Self Care | Payer: Medicare Other | Source: Ambulatory Visit | Attending: Cardiovascular Disease | Admitting: Cardiovascular Disease

## 2013-07-25 DIAGNOSIS — I6529 Occlusion and stenosis of unspecified carotid artery: Secondary | ICD-10-CM | POA: Insufficient documentation

## 2013-07-25 NOTE — Progress Notes (Signed)
Carotid Duplex Completed. °Brianna L Mazza,RVT °

## 2013-07-26 ENCOUNTER — Other Ambulatory Visit: Payer: Self-pay | Admitting: Internal Medicine

## 2013-08-01 ENCOUNTER — Encounter: Payer: Self-pay | Admitting: *Deleted

## 2013-08-01 ENCOUNTER — Telehealth: Payer: Self-pay | Admitting: *Deleted

## 2013-08-01 DIAGNOSIS — I6529 Occlusion and stenosis of unspecified carotid artery: Secondary | ICD-10-CM

## 2013-08-01 NOTE — Telephone Encounter (Signed)
Message copied by Marella BileVOGEL, Jaqualyn Juday W. on Mon Aug 01, 2013  7:08 PM ------      Message from: Runell GessBERRY, JONATHAN J      Created: Sun Jul 31, 2013  6:46 PM       No change from prior study. Repeat in 12 months. ------

## 2013-08-01 NOTE — Telephone Encounter (Signed)
Order placed for repeat carotid dopplers in 1 year  

## 2013-08-02 ENCOUNTER — Telehealth: Payer: Self-pay

## 2013-08-02 NOTE — Telephone Encounter (Signed)
The patient called and is hoping to get her testing supplies re-ordered.  She states [facility] but they are not providing her with the correct supplies.     Callback [phone]

## 2013-08-03 ENCOUNTER — Other Ambulatory Visit: Payer: Self-pay | Admitting: Internal Medicine

## 2013-08-09 ENCOUNTER — Telehealth: Payer: Self-pay | Admitting: Endocrinology

## 2013-08-09 NOTE — Telephone Encounter (Signed)
Pt needs supplies from [facility] # [id number] and they shorted her on her order on the test strips they need MD auth. They only sent 200 test strips.

## 2013-08-22 ENCOUNTER — Ambulatory Visit: Payer: Medicare Other | Admitting: Endocrinology

## 2013-08-25 ENCOUNTER — Ambulatory Visit: Payer: Medicare Other | Admitting: Endocrinology

## 2013-08-31 ENCOUNTER — Other Ambulatory Visit: Payer: Self-pay | Admitting: *Deleted

## 2013-08-31 ENCOUNTER — Encounter: Payer: Self-pay | Admitting: Endocrinology

## 2013-08-31 ENCOUNTER — Encounter: Payer: Medicare Other | Attending: Endocrinology | Admitting: Nutrition

## 2013-08-31 ENCOUNTER — Ambulatory Visit (INDEPENDENT_AMBULATORY_CARE_PROVIDER_SITE_OTHER): Payer: Medicare Other | Admitting: Endocrinology

## 2013-08-31 VITALS — BP 146/62 | HR 61 | Temp 98.3°F | Resp 14 | Ht 65.75 in | Wt 207.4 lb

## 2013-08-31 DIAGNOSIS — E039 Hypothyroidism, unspecified: Secondary | ICD-10-CM

## 2013-08-31 DIAGNOSIS — E785 Hyperlipidemia, unspecified: Secondary | ICD-10-CM

## 2013-08-31 DIAGNOSIS — I6529 Occlusion and stenosis of unspecified carotid artery: Secondary | ICD-10-CM

## 2013-08-31 DIAGNOSIS — E1149 Type 2 diabetes mellitus with other diabetic neurological complication: Secondary | ICD-10-CM

## 2013-08-31 DIAGNOSIS — Z713 Dietary counseling and surveillance: Secondary | ICD-10-CM | POA: Insufficient documentation

## 2013-08-31 DIAGNOSIS — Z794 Long term (current) use of insulin: Secondary | ICD-10-CM | POA: Insufficient documentation

## 2013-08-31 DIAGNOSIS — E1142 Type 2 diabetes mellitus with diabetic polyneuropathy: Secondary | ICD-10-CM

## 2013-08-31 DIAGNOSIS — E119 Type 2 diabetes mellitus without complications: Secondary | ICD-10-CM | POA: Insufficient documentation

## 2013-08-31 DIAGNOSIS — R635 Abnormal weight gain: Secondary | ICD-10-CM | POA: Insufficient documentation

## 2013-08-31 LAB — BASIC METABOLIC PANEL
BUN: 23 mg/dL (ref 6–23)
CALCIUM: 8.8 mg/dL (ref 8.4–10.5)
CO2: 28 mEq/L (ref 19–32)
Chloride: 105 mEq/L (ref 96–112)
Creatinine, Ser: 1.3 mg/dL — ABNORMAL HIGH (ref 0.4–1.2)
GFR: 44.25 mL/min — AB (ref >60.00)
GLUCOSE: 134 mg/dL — AB (ref 70–99)
POTASSIUM: 4.5 meq/L (ref 3.5–5.1)
Sodium: 137 mEq/L (ref 135–145)

## 2013-08-31 LAB — HEMOGLOBIN A1C: Hgb A1c MFr Bld: 6.7 % — ABNORMAL HIGH (ref 4.6–6.5)

## 2013-08-31 LAB — TSH: TSH: 0.77 u[IU]/mL (ref 0.35–5.50)

## 2013-08-31 LAB — LIPID PANEL
CHOL/HDL RATIO: 3
Cholesterol: 148 mg/dL (ref 0–200)
HDL: 50.9 mg/dL (ref >39.00)
LDL Cholesterol: 80 mg/dL (ref 0–99)
Triglycerides: 85 mg/dL (ref 0.0–149.0)
VLDL: 17 mg/dL (ref 0.0–40.0)

## 2013-08-31 LAB — URINALYSIS, ROUTINE W REFLEX MICROSCOPIC
Bilirubin Urine: NEGATIVE
HGB URINE DIPSTICK: NEGATIVE
Ketones, ur: NEGATIVE
LEUKOCYTES UA: NEGATIVE
NITRITE: NEGATIVE
SPECIFIC GRAVITY, URINE: 1.01 (ref 1.000–1.030)
Total Protein, Urine: NEGATIVE
URINE GLUCOSE: NEGATIVE
UROBILINOGEN UA: 0.2 (ref 0.0–1.0)
pH: 6 (ref 5.0–8.0)

## 2013-08-31 LAB — T4, FREE: Free T4: 1.05 ng/dL (ref 0.60–1.60)

## 2013-08-31 MED ORDER — LIRAGLUTIDE 18 MG/3ML ~~LOC~~ SOPN: PEN_INJECTOR | SUBCUTANEOUS | Status: DC

## 2013-08-31 NOTE — Patient Instructions (Signed)
TAKE 30 units Lanrus in am and 30 in pm Adjust pm dose of Lantus only if am sugar stays <90 or >140  Adjust am dose of Lantus only if SUPPER sugar stays <90 or >140  Start VICTOZA injection with the sample pen once daily at the same time of the day.  Dial the dose to 0.6 mg for the first 4-5 DAYS .  You may  experience nausea in the first few days which usually gets better  THEN increase the dose to 1.2mg  daily if no nausea.    You will feel fullness of the stomach with starting the medication and should try to keep portions of food small.

## 2013-08-31 NOTE — Progress Notes (Signed)
Monica [name]   Reason for Appointment : Followup for Type 2 Diabetes  History of Present Illness          Diagnosis: Type 2 diabetes mellitus, date of diagnosis: ? 2001        Past history:  She had been tried on oral agents for a year or 2 before putting her on insulin. She took Glucotrol, Glucophage, Janumet, Januvia and Avandia at various times.  She also has been on variety of insulin regimens. At some point was taking Lantus insulin, 90 units a day but not clear if she was taking mealtime insulin with this. Previously taking NPH + regular insulin and also 75/25 without good control. With the premixed insulin she was taking about 180 units a day She was doing relatively well with the regimen of Lantus and Humalog at mealtimes and her A1c had been down to 7.1 in 2011.  At that time her weight was about 250 pounds.Previously was on Lantus 42 b.i.d. Humalog at mealtimes: 26-26-28. Lantus stopped in 2012 or so and she was switched to premixed insulin but apparently had worsening control with this. Her insulin was stopped by her PCP in 5/14 and she was switched to Victoza with marked increase in her blood sugar subsequently. She was also tried on Invokana which caused candidiasis and did not improve blood sugars Prior to her initial visit here her A1c was 10.1 Since starting back on her insulin basal bolus insulin regimen/2014 her blood sugars improved significantly.  RECENT history:   She has not been back in followup since 11/14 Her blood sugars are fluctuating significantly and sporadically higher  Current blood sugar patterns:  Fasting readings are ranging from 122-234 although recently has only a couple of good readings. She is doing her pre-meal readings anywhere between 8 AM-12 noon  She is checking her blood sugars mostly before breakfast and supper and evening readings are recently better  Has only rare blood sugars late at night which are good  Overall average glucose is  172  She is again adjusting her Lantus and NovoLog based on the blood sugar at the time of injection Does not understand how to adjust Lantus based on fasting blood sugars or NovoLog based on how much she is eating in addition to pre-meal blood sugars Also may occasionally forget her Lantus at night She is concerned about her tendency to gain weight and asking about an insulin pump  INSULIN: LANTUS 26-30 BID; Novolog 14-20 a.c. Oral hypoglycemic drugs the patient is taking are: Metformin only      Side effects from medications have been: Candidiasis from Invokana, diarrhea from full dose metformin    Glucose monitoring:  done 1.5 times a day         Glucometer:  One Touch Verio.      Blood Glucose readings from download  PREMEAL Breakfast Lunch Dinner Bedtime Overall  Glucose range:  122-234    88-267   131    Mean/median:  160    151    172/150       Hypoglycemia frequency:  rare        Self-care: The diet that the patient has been following is: Be able     Meals: 2-3meals per day.          Physical activity: exercise: Unable to do much.          Dietician visit: Most recent:.2011                     Lab Results  Component Value Date   HGBA1C 11.8* 02/24/2013   Wt Readings from Last 3 Encounters:  08/31/13 207 lb 6.4 oz (94.076 kg)  03/22/13 200 lb (90.719 kg)  02/24/13 184 lb 1.6 oz (83.507 kg)      Medication List       This list is accurate as of: 08/31/13  2:56 PM.  Always use your most recent med list.               alendronate 70 MG tablet  Commonly known as:  FOSAMAX  Take 70 mg by mouth every 7 (seven) days. Take with a full glass of water on an empty stomach.     ALPRAZolam 1 MG tablet  Commonly known as:  XANAX  Take 1 mg by mouth as needed.     clopidogrel 75 MG tablet  Commonly known as:  PLAVIX  Take 75 mg by mouth daily.     dicyclomine 20 MG tablet  Commonly known as:  BENTYL  Take 20 mg by mouth every 6 (six) hours.     diphenoxylate-atropine  2.5-0.025 MG per tablet  Commonly known as:  LOMOTIL  Take 1 tablet by mouth 4 (four) times daily as needed for diarrhea or loose stools.     furosemide 20 MG tablet  Commonly known as:  LASIX  Take 20 mg by mouth 2 (two) times daily as needed.     Hydrocodone-Acetaminophen 5-300 MG Tabs  Take 1 tablet every 6-8 hours as needed for pain     insulin aspart 100 UNIT/ML FlexPen  Commonly known as:  NOVOLOG  Inject 10-20 Units into the skin 3 (three) times daily with meals.     Insulin Glargine 100 UNIT/ML Solostar Pen  Commonly known as:  LANTUS  Inject 25-30 Units into the skin 2 (two) times daily.     levothyroxine 75 MCG tablet  Commonly known as:  SYNTHROID, LEVOTHROID  Take 75 mcg by mouth daily. Brand Name Only     metFORMIN 1000 MG tablet  Commonly known as:  GLUCOPHAGE  Take 1,000 mg by mouth 2 (two) times daily with a meal.     metoprolol 50 MG tablet  Commonly known as:  LOPRESSOR     metoprolol succinate 50 MG 24 hr tablet  Commonly known as:  TOPROL-XL  Take 50 mg by mouth daily.     multivitamin tablet  Take 1 tablet by mouth daily.     oxyCODONE 5 MG immediate release tablet  Commonly known as:  Oxy IR/ROXICODONE  5 mg.     pantoprazole 20 MG tablet  Commonly known as:  PROTONIX  Take 20 mg by mouth daily.     promethazine 25 MG tablet  Commonly known as:  PHENERGAN  Take 25 mg by mouth every 6 (six) hours as needed.     simvastatin 40 MG tablet  Commonly known as:  ZOCOR  Take 40 mg by mouth at bedtime.     traMADol 50 MG tablet  Commonly known as:  ULTRAM  Take 50 mg by mouth every 6 (six) hours as needed. Maximum dose= 8 tablets per day     VICTOZA [place]  Inject into the skin daily. 6-12 units     vitamin C 1000 MG tablet  Take 1,000 mg by mouth daily.        Allergies:  Allergies  Allergen Reactions  . Augmentin [Amoxicillin-Pot Clavulanate]   . Biaxin [Clarithromycin]   . Latex   .  Penicillins   . Sulfonamide Derivatives      Past Medical History  Diagnosis Date  . Type II or unspecified type diabetes mellitus without mention of complication, not stated as uncontrolled   . Coronary atherosclerosis of unspecified type of vessel, native or graft   . GERD (gastroesophageal reflux disease)   . Osteoarthrosis, unspecified whether generalized or localized, unspecified site   . Obesity, unspecified   . Macular degeneration (senile) of retina, unspecified   . Unspecified sleep apnea   . Irritable bowel syndrome   . Endometrial polyp   . Hypertension   . Hiatal hernia   . Esophageal varices   . Iron deficiency anemia   . 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 Hyperlipidemia   . SOB (shortness of breath)     Normal 2D echo - EF-58, 03/18/2001  . Chest pain 09/02/2012    Normal, EF-67  . Carotid artery bruit     Doppler 07/09/2011 - abnormal study, no change from previous    Past Surgical History  Procedure Laterality Date  . Appendectomy    . Cholecystectomy    . Carotid endarterectomy    . Cardiac catheterization  03/18/2007    Normal coronary arteries and LV function  . Cardiac catheterization  07/01/2001    Normal cath  . Cardiac catheterization  05/17/1997    Normal cath    Family History  Problem Relation Age of Onset  . Breast cancer Mother   . Diabetes Father   . Breast cancer Sister   . Diabetes Sister   . Diabetes Brother   . Colon cancer Paternal Aunt   . Stomach cancer Paternal Aunt     Social History:  reports that she has been smoking Cigarettes.  She has been smoking about 1.00 pack per day. She has never used smokeless tobacco. She reports that she does not drink alcohol or use illicit drugs.    Review of Systems       Lipids: Controlled. Currently  on 40 mg Zocor  Lab Results  Component Value Date   CHOL 148 08/31/2013   HDL 50.90 08/31/2013   LDLCALC 80 08/31/2013   TRIG 85.0 08/31/2013   CHOLHDL 3  08/31/2013        She has decreased vision in the left eye from macular degeneration     She has had a multinodular goiter. This has been evaluated previously with ultrasounds and also biopsy of left thyroid mass. Last ultrasound in 11/10 suggested that she had a soft tissue mass behind left lobe.       HYPOTHYROIDISM: Previously had normal thyroid levels with supplementation using 75 mcg levothyroxine  Lab Results  Component Value Date   TSH 1.20 02/24/2013              GI: She has had Cirrhosis from [place], followed by gastroenterologist            She has had Numbness in her feet  She has evidence of neuropathy with sensory loss previously    Took Lasix for swelling, not needing this now    Physical Examination:  BP 146/62  Pulse 61  Temp(Src) 98.3 F (36.8 C)  Resp 14  Ht 5' 5.75" (1.67 m)  Wt 207 lb 6.4 oz (94.076 kg)  BMI 33.73 kg/m2  SpO2 99%  No pedal edema  ASSESSMENT:  Diabetes type 2, uncontrolled:  She is being reevaluated  after several months and now recent A1c report available Still has some fluctuation in blood sugars probably because of inconsistent diet and adjusting both basal and bolus insulin doses arbitrarily based on blood sugar readings rather than food intake or blood sugar trend  Since she is concerned about her weight gain have discussed with her the options of using Victoza or Invokana. Since she had candidiasis of Invokana she may be able to go back to Victoza. Discussed again benefits of Victoza and possible side effects, safety and dosage titration  Hypothyroidism: Will check TSH today and adjust the dosage as needed, reminded patient to be taking medication consistently in the morning before breakfast  Hyperlipidemia: Adequately controlled with lipids at target on Zocor   PLAN:   Discussed how to adjust Lantus based on fasting blood sugar trend. Her blood sugar targets are 90-140 and written instructions given. For now she needs to take  30 units twice a day. Advised her to take her Lantus insulin at consistent times  Also she will be given guidelines on adjusting her NovoLog based on carbohydrates as well as pre-meal blood sugar by nurse educator today Most likely will need larger doses before evening meal and with larger carbohydrate intake  Encouraged her to be walking as much as possible  To start Victoza, detailed instructions given to start with 0.6 mg for at least 4-5 days before increasing to 1.2 mg if no nausea. Sample of the pen and starter kit given  Counseling time over 50% of today's 25 minute visit  Elayne Snare 08/31/2013, 2:56 PM

## 2013-09-01 NOTE — Progress Notes (Signed)
Quick Note:  Please let patient know that the A1c is excellent at 6.7, thyroid okay, cholesterol okay ______

## 2013-09-05 NOTE — Progress Notes (Signed)
This patient is here today to restart her Victoza and to learn to adjust her Novolog doses for varying meal sizes. She had stopped the Victoza several months ago, and has regained her weight.  Blood sugars are now higher.  Discussed the weight gain, and the need to limit portion sizes. We reviewed how to use the Victoza pen, and the need to start with 0.6 dose and weight 7 days, before going up to 1.2 dose.  She reported good understanding of this.  We also discussed the fact that the amount of Novolog taken before each meal, is directly related to the amount carbs eaten.  We reviewed what carbs were, and she was given a list of the carbohydrate foods and portion sizes for each serving.   We reviewed her diet and how many carbs she is presently eating.  Meals are variable in the amounts of carbs eaten day to day.  She was shown this and reported good understanding.  We discussed the need to test blood sugars before the next meal (4 hours later) to determine if the dose of Novolog was appropriate for the previous meal eaten.  She reported good understanding of this concept and agreed to increase/decrease her dosage by 2u when eating more/less carbs.  She was warned that if she takes more carbs/insulin, she will gain weight.  She agreed with this.

## 2013-09-06 NOTE — Patient Instructions (Signed)
Test blood sugars before each meal and at bedtime to determine if the Novolog dose taken before the previous meal was appropriate. Take 0.6 Victoza once a day. Increase the dose of Victoza to 1.2 after 7 days, if no nausea.

## 2013-09-08 ENCOUNTER — Telehealth: Payer: Self-pay | Admitting: Endocrinology

## 2013-09-08 NOTE — Telephone Encounter (Signed)
Closed on accident

## 2013-09-08 NOTE — Telephone Encounter (Signed)
Patient is requesting urine results

## 2013-09-08 NOTE — Telephone Encounter (Signed)
Results left on vm  

## 2013-09-08 NOTE — Telephone Encounter (Signed)
normal

## 2013-09-08 NOTE — Telephone Encounter (Signed)
Patient is requesting last urine results

## 2013-10-13 ENCOUNTER — Ambulatory Visit: Payer: Medicare Other | Admitting: Endocrinology

## 2013-10-20 ENCOUNTER — Ambulatory Visit: Payer: Medicare Other | Admitting: Endocrinology

## 2013-11-02 ENCOUNTER — Ambulatory Visit (INDEPENDENT_AMBULATORY_CARE_PROVIDER_SITE_OTHER): Payer: Medicare Other | Admitting: Endocrinology

## 2013-11-02 ENCOUNTER — Encounter: Payer: Self-pay | Admitting: Endocrinology

## 2013-11-02 ENCOUNTER — Encounter: Payer: Medicare Other | Attending: Endocrinology | Admitting: Nutrition

## 2013-11-02 VITALS — BP 132/68 | HR 78 | Temp 98.3°F | Resp 16 | Ht 65.75 in | Wt 210.6 lb

## 2013-11-02 DIAGNOSIS — Z794 Long term (current) use of insulin: Secondary | ICD-10-CM | POA: Diagnosis not present

## 2013-11-02 DIAGNOSIS — I6529 Occlusion and stenosis of unspecified carotid artery: Secondary | ICD-10-CM

## 2013-11-02 DIAGNOSIS — E119 Type 2 diabetes mellitus without complications: Secondary | ICD-10-CM | POA: Diagnosis not present

## 2013-11-02 DIAGNOSIS — R635 Abnormal weight gain: Secondary | ICD-10-CM | POA: Diagnosis not present

## 2013-11-02 DIAGNOSIS — Z713 Dietary counseling and surveillance: Secondary | ICD-10-CM | POA: Diagnosis present

## 2013-11-02 DIAGNOSIS — E1165 Type 2 diabetes mellitus with hyperglycemia: Principal | ICD-10-CM

## 2013-11-02 DIAGNOSIS — IMO0001 Reserved for inherently not codable concepts without codable children: Secondary | ICD-10-CM

## 2013-11-02 NOTE — Patient Instructions (Signed)
Insert a new V-go every morning Take 6-8 clicks before every meal. Test blood sugars before meals and at bedtime Call blood sugars to office on Friday.

## 2013-11-02 NOTE — Patient Instructions (Signed)
Lantus 34 at bedtime and 26 in am on waking up  Please check blood sugars at least half the time about 2 hours after any meal and times per week on waking up. Please bring blood sugar monitor to each visit

## 2013-11-02 NOTE — Progress Notes (Signed)
This patient and her husband were instructed on how to fill, apply and use the V-go.  She re de monstarted how to fill and use the V-go without any difficulty.  She was given a starter kit of V-go 40 with the telephone number to call to get insurance verification and if she has any questions. She will D/C her Lantus tonight and begin this in the AM.   She was told to call in her blood sugars on Friday.  She agreed to do this.

## 2013-11-02 NOTE — Progress Notes (Signed)
Monica [name] 63 y.o.    Reason for Appointment : Followup for Type 2 Diabetes  History of Present Illness          Diagnosis: Type 2 diabetes mellitus, date of diagnosis: ? 2001        Past history:  She had been tried on oral agents for a year or 2 before putting her on insulin. She took Glucotrol, Glucophage, Janumet, Januvia and Avandia at various times.  She also has been on variety of insulin regimens. At some point was taking Lantus insulin, 90 units a day but not clear if she was taking mealtime insulin with this. Previously taking NPH + regular insulin and also 75/25 without good control. With the premixed insulin she was taking about 180 units a day She was doing relatively well with the regimen of Lantus and Humalog at mealtimes and her A1c had been down to 7.1 in 2011.  At that time her weight was about 250 pounds.Previously was on Lantus 42 b.i.d. Humalog at mealtimes: 26-26-28. Lantus stopped in 2012 or so and she was switched to premixed insulin but apparently had worsening control with this. Her insulin was stopped by her PCP in 5/14 and she was switched to Victoza with marked increase in her blood sugar subsequently. She was also tried on Invokana which caused candidiasis and did not improve blood sugars Prior to her initial visit here her A1c was 10.1 Since starting back on her insulin basal bolus insulin regimen/2014 her blood sugars improved significantly.  RECENT history:    In 4/15 because of her tendency to weight gain and needing large amounts of insulin she was started on Victoza which she had taken previously She was told to continue her insulin unchanged; also was given instructions on how to adjust Lantus in the evening based on morning sugar trend She has gone to 1.2 mg Victoza and she thinks it helps control her portions but has not lost any weight Also not clear if she is taking less insulin She apparently is still adjusting her Lantus based on the blood sugar  before the injection Glucose monitoring: This is done mostly before her breakfast and supper is Current blood sugar patterns and problems:  Fasting readings are recently higher possibly from her taking less insulin with her Lantus in the evening. Also she thinks her blood sugar was higher this morning from an eating ice cream last night  However some of the fasting readings previously have been fairly good   Suppertime readings are relatively better but occasionally as high as 172  She thinks sometimes she has difficulty taking her NovoLog insulin when eating out   She believes that she sometimes feels feels shaky when blood sugar is low normal  Also occasionally has woken up in the night feeling sweaty but has not confirmed any hypoglycemia  She is again adjusting her Lantus and NovoLog based on the blood sugar at the time of injection Does not understand how to adjust Lantus based on fasting blood sugars or NovoLog based on how much she is eating in addition to pre-meal blood sugars She is concerned about her tendency to gain weight and asking about an insulin pump  INSULIN: LANTUS 30-30 ac bid; Novolog 16-18 a.c. Oral hypoglycemic drugs the patient is taking are: Metformin only      Side effects from medications have been: Candidiasis from Invokana, diarrhea from full dose metformin    Glucose monitoring:  done 1.5 times a day  Glucometer:  [employer] psychotherapist.      Blood Glucose readings from download  PREMEAL Breakfast pcb Dinner Bedtime Overall  Glucose range: 121-213 109 89-172 149-250   Mean/median:     158      Hypoglycemia frequency:  rare        Self-care: The diet that the patient has been following is:     Meals: 2-77meals per day.          Physical activity: exercise: Unable to do much.          Dietician visit: Most recent:.2011                   Lab Results  Component Value Date   HGBA1C 6.7* 08/31/2013   Wt Readings from Last 3 Encounters:  11/02/13 210  lb 9.6 oz (95.528 kg)  08/31/13 207 lb 6.4 oz (94.076 kg)  03/22/13 200 lb (90.719 kg)      Medication List       This list is accurate as of: 11/02/13 11:36 AM.  Always use your most recent med list.               alendronate 70 MG tablet  Commonly known as:  FOSAMAX  Take 70 mg by mouth every 7 (seven) days. Take with a full glass of water on an empty stomach.     ALPRAZolam 1 MG tablet  Commonly known as:  XANAX  Take 1 mg by mouth as needed.     clopidogrel 75 MG tablet  Commonly known as:  PLAVIX  Take 75 mg by mouth daily.     dicyclomine 20 MG tablet  Commonly known as:  BENTYL  Take 20 mg by mouth every 6 (six) hours.     diphenoxylate-atropine 2.5-0.025 MG per tablet  Commonly known as:  LOMOTIL  Take 1 tablet by mouth 4 (four) times daily as needed for diarrhea or loose stools.     furosemide 20 MG tablet  Commonly known as:  LASIX  Take 20 mg by mouth 2 (two) times daily as needed.     Hydrocodone-Acetaminophen 5-300 MG Tabs  Take 1 tablet every 6-8 hours as needed for pain     insulin aspart 100 UNIT/ML FlexPen  Commonly known as:  NOVOLOG  Inject 10-20 Units into the skin 3 (three) times daily with meals.     Insulin Glargine 100 UNIT/ML Solostar Pen  Commonly known as:  LANTUS  Inject 25-30 Units into the skin 2 (two) times daily.     levothyroxine 75 MCG tablet  Commonly known as:  SYNTHROID, LEVOTHROID  Take 75 mcg by mouth daily. Brand Name Only     Liraglutide 18 MG/3ML Sopn  Commonly known as:  VICTOZA  Inject 1.2 mg daily     metFORMIN 1000 MG tablet  Commonly known as:  GLUCOPHAGE  Take 1,000 mg by mouth 2 (two) times daily with a meal.     metoprolol 50 MG tablet  Commonly known as:  LOPRESSOR     metoprolol succinate 50 MG 24 hr tablet  Commonly known as:  TOPROL-XL  Take 50 mg by mouth daily.     multivitamin tablet  Take 1 tablet by mouth daily.     pantoprazole 20 MG tablet  Commonly known as:  PROTONIX  Take 20 mg by  mouth daily.     promethazine 25 MG tablet  Commonly known as:  PHENERGAN  Take 25 mg by mouth every 6 (  six) hours as needed.     simvastatin 40 MG tablet  Commonly known as:  ZOCOR  Take 40 mg by mouth at bedtime.     traMADol 50 MG tablet  Commonly known as:  ULTRAM  Take 50 mg by mouth every 6 (six) hours as needed. Maximum dose= 8 tablets per day     vitamin C 1000 MG tablet  Take 1,000 mg by mouth daily.        Allergies:  Allergies  Allergen Reactions  . Augmentin [Amoxicillin-Pot Clavulanate]   . Biaxin [Clarithromycin]   . Latex   . Penicillins   . Sulfonamide Derivatives     Past Medical History  Diagnosis Date  . Type II or unspecified type diabetes mellitus without mention of complication, not stated as uncontrolled   . Coronary atherosclerosis of unspecified type of vessel, native or graft   . GERD (gastroesophageal reflux disease)   . Osteoarthrosis, unspecified whether generalized or localized, unspecified site   . Obesity, unspecified   . Macular degeneration (senile) of retina, unspecified   . Unspecified sleep apnea   . Irritable bowel syndrome   . Endometrial polyp   . Hypertension   . Hiatal hernia   . Esophageal varices   . Iron deficiency anemia   . 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 Hyperlipidemia   . SOB (shortness of breath)     Normal 2D echo - EF-58, 03/18/2001  . Chest pain 09/02/2012    Normal, EF-67  . Carotid artery bruit     Doppler 07/09/2011 - abnormal study, no change from previous    Past Surgical History  Procedure Laterality Date  . Appendectomy    . Cholecystectomy    . Carotid endarterectomy    . Cardiac catheterization  03/18/2007    Normal coronary arteries and LV function  . Cardiac catheterization  07/01/2001    Normal cath  . Cardiac catheterization  05/17/1997    Normal cath    Family History  Problem Relation Age of Onset  . Breast  cancer Mother   . Diabetes Father   . Breast cancer Sister   . Diabetes Sister   . Diabetes Brother   . Colon cancer Paternal Aunt   . Stomach cancer Paternal Aunt     Social History:  reports that she has been smoking Cigarettes.  She has been smoking about 1.00 pack per day. She has never used smokeless tobacco. She reports that she does not drink alcohol or use illicit drugs.    Review of Systems       Lipids: Controlled. Currently  on 40 mg Zocor  Lab Results  Component Value Date   CHOL 148 08/31/2013   HDL 50.90 08/31/2013   LDLCALC 80 08/31/2013   TRIG 85.0 08/31/2013   CHOLHDL 3 08/31/2013        She has decreased vision in the left eye from macular degeneration     She has had a multinodular goiter. This has been evaluated previously with ultrasounds and also biopsy of left thyroid mass. Last ultrasound in 11/10 suggested that she had a soft tissue mass behind left lobe.       HYPOTHYROIDISM: She has normal thyroid levels with supplementation using 75 mcg levothyroxine  Lab Results  Component Value Date   TSH 0.77 08/31/2013              GI: She has  had Cirrhosis from [place], followed by gastroenterologist    Abdominal  Discomfort in pms          She has had Numbness in her feet  She has evidence of neuropathy with sensory loss previously    Took Lasix for swelling, not needing this now   Physical Examination:  BP 132/68  Pulse 78  Temp(Src) 98.3 F (36.8 C)  Resp 16  Ht 5' 5.75" (1.67 m)  Wt 210 lb 9.6 oz (95.528 kg)  BMI 34.25 kg/m2  SpO2 97%  No pedal edema  ASSESSMENT:  Diabetes type 2, uncontrolled:  Although her A1c was reasonably good at 6.7 on her last visit she still has overall high readings and more so in the mornings before her first meal She is not able to understand how to adjust her Lantus insulin based on morning blood sugar trends Previously reminded her not to adjust Lantus based on the glucose at the time of insulin injection   Still has some fluctuation in blood sugars probably because of inconsistent diet  Also may have occasional noncompliance with mealtime insulin when eating out Not clear if she has benefited from taking Victoza although awakened has slowed down and she thinks it helps portion control  Abdominal pain: Advised her to followup with GI   PLAN:   Discussed benefits of the V.-go pump and how to use it. Since she is probably not taking the 60 units of Lantus daily as directed most likely can get her controlled with only the 40 unit basal insulin on the pump  Also she would benefit from getting mealtime control using bolus on demand and avoiding noncompliance  She was instructed on how to use the V.-go pump by nurse educator and she will try with a sample. She will also need to get insurance verification  She will call blood sugar readings on Friday for further adjustment and if fasting readings are high we'll consider adding Lantus insulin  Counseling time over 50% of today's 25 minute visit  KUMAR,AJAY 11/02/2013, 11:36 AM

## 2013-11-07 ENCOUNTER — Telehealth: Payer: Self-pay | Admitting: Endocrinology

## 2013-11-07 NOTE — Telephone Encounter (Signed)
Pt has question regarding victoza is she still supposed to take this med

## 2013-11-10 ENCOUNTER — Other Ambulatory Visit: Payer: Self-pay | Admitting: *Deleted

## 2013-11-10 MED ORDER — INSULIN ASPART 100 UNIT/ML ~~LOC~~ SOLN: SUBCUTANEOUS | Status: DC

## 2013-11-10 MED ORDER — V-GO 40 KIT: PACK | Status: DC

## 2013-11-30 ENCOUNTER — Telehealth: Payer: Self-pay | Admitting: Endocrinology

## 2013-11-30 NOTE — Telephone Encounter (Signed)
Patient stated that since she have been taken off the Novalog and Lantus, and only been using the Vgo insulin pump her blood sugar has been running high.  Please advise

## 2013-12-01 NOTE — Telephone Encounter (Signed)
Please have her start taking additional 10 units Lantus daily and continue the pump. Needs to see me as soon as possible for followup

## 2013-12-01 NOTE — Telephone Encounter (Signed)
Patient said since she's been off the lantus her sugar are running higher, from 170-209 all during the day, she said the V-Go is not controlling her sugars, was not more specific.  Please advise

## 2013-12-01 NOTE — Telephone Encounter (Signed)
Left message for patient to call me back. 

## 2013-12-02 NOTE — Telephone Encounter (Signed)
Noted, patient is aware. She will call to schedule a sooner appointment

## 2013-12-16 ENCOUNTER — Other Ambulatory Visit: Payer: Self-pay | Admitting: Internal Medicine

## 2013-12-16 DIAGNOSIS — Z1231 Encounter for screening mammogram for malignant neoplasm of breast: Secondary | ICD-10-CM

## 2013-12-23 ENCOUNTER — Ambulatory Visit: Payer: Medicare Other | Admitting: Physician Assistant

## 2013-12-23 ENCOUNTER — Ambulatory Visit: Payer: Medicare Other | Admitting: Internal Medicine

## 2013-12-23 ENCOUNTER — Ambulatory Visit: Payer: Medicare Other

## 2013-12-29 ENCOUNTER — Ambulatory Visit: Payer: Medicare Other

## 2014-01-04 ENCOUNTER — Ambulatory Visit: Payer: Medicare Other

## 2014-01-11 ENCOUNTER — Telehealth: Payer: Self-pay | Admitting: Internal Medicine

## 2014-01-11 ENCOUNTER — Ambulatory Visit: Payer: Medicare Other | Admitting: Internal Medicine

## 2014-01-11 NOTE — Telephone Encounter (Signed)
No charge. 

## 2014-01-19 ENCOUNTER — Other Ambulatory Visit: Payer: Self-pay | Admitting: Endocrinology

## 2014-01-25 ENCOUNTER — Ambulatory Visit: Payer: Medicare Other | Admitting: Endocrinology

## 2014-02-06 ENCOUNTER — Ambulatory Visit: Payer: Medicare Other | Admitting: Internal Medicine

## 2014-02-06 ENCOUNTER — Ambulatory Visit: Payer: Medicare Other

## 2014-02-17 ENCOUNTER — Ambulatory Visit: Payer: Medicare Other | Admitting: Endocrinology

## 2014-02-21 ENCOUNTER — Ambulatory Visit: Payer: Medicare Other | Admitting: Internal Medicine

## 2014-02-21 ENCOUNTER — Ambulatory Visit: Payer: Medicare Other

## 2014-03-07 ENCOUNTER — Ambulatory Visit: Payer: Medicare Other | Admitting: Endocrinology

## 2014-03-23 ENCOUNTER — Ambulatory Visit: Payer: Medicare Other | Admitting: Endocrinology

## 2014-03-30 ENCOUNTER — Telehealth: Payer: Self-pay | Admitting: Endocrinology

## 2014-03-30 NOTE — Telephone Encounter (Signed)
Pt needs lantus solostar called in takes it 2 times daily 20-24 u in am and pm

## 2014-03-31 ENCOUNTER — Other Ambulatory Visit: Payer: Self-pay | Admitting: *Deleted

## 2014-03-31 MED ORDER — INSULIN GLARGINE 100 UNIT/ML SOLOSTAR PEN
20.0000 [IU] | PEN_INJECTOR | Freq: Two times a day (BID) | SUBCUTANEOUS | Status: DC
Start: 2014-03-31 — End: 2014-05-10

## 2014-03-31 NOTE — Telephone Encounter (Signed)
rx sent for 30 days only, patient needs to be seen in office for further refills

## 2014-04-11 ENCOUNTER — Ambulatory Visit: Payer: Medicare Other | Admitting: Internal Medicine

## 2014-04-11 ENCOUNTER — Telehealth: Payer: Self-pay | Admitting: Internal Medicine

## 2014-04-11 ENCOUNTER — Ambulatory Visit: Payer: Medicare Other

## 2014-04-11 NOTE — Telephone Encounter (Signed)
No charge. 

## 2014-04-21 ENCOUNTER — Ambulatory Visit: Payer: Medicare Other | Admitting: Endocrinology

## 2014-05-05 ENCOUNTER — Telehealth: Payer: Self-pay | Admitting: Endocrinology

## 2014-05-05 ENCOUNTER — Ambulatory Visit: Payer: Medicare Other

## 2014-05-05 NOTE — Telephone Encounter (Signed)
Patient need refill of Lanstus 100 units, Novalog 100 units send to [facility]

## 2014-05-05 NOTE — Telephone Encounter (Signed)
Denied patient must be seen before refills given

## 2014-05-10 ENCOUNTER — Telehealth: Payer: Self-pay | Admitting: Endocrinology

## 2014-05-10 ENCOUNTER — Ambulatory Visit: Payer: Medicare Other | Admitting: Endocrinology

## 2014-05-10 MED ORDER — INSULIN ASPART 100 UNIT/ML FLEXPEN: 20.0000 [IU] | PEN_INJECTOR | Freq: Three times a day (TID) | SUBCUTANEOUS | Status: DC

## 2014-05-10 NOTE — Telephone Encounter (Signed)
Please call in refill on Novolog pen and lantus solostar to [facility]. Completely out of lantus and they are closing today at 7

## 2014-06-08 ENCOUNTER — Other Ambulatory Visit: Payer: Self-pay | Admitting: *Deleted

## 2014-06-08 ENCOUNTER — Telehealth: Payer: Self-pay | Admitting: Endocrinology

## 2014-06-08 NOTE — Telephone Encounter (Signed)
Pt needs to be seen for refills

## 2014-06-08 NOTE — Telephone Encounter (Signed)
Patient need refill of Lantus solostar pens, and Novalog flex pens

## 2014-06-12 ENCOUNTER — Ambulatory Visit: Payer: Medicare Other

## 2014-06-12 ENCOUNTER — Ambulatory Visit: Payer: Medicare Other | Admitting: Endocrinology

## 2014-06-12 ENCOUNTER — Ambulatory Visit: Payer: Medicare Other | Admitting: Internal Medicine

## 2014-06-13 ENCOUNTER — Ambulatory Visit: Payer: Medicare Other

## 2014-06-20 ENCOUNTER — Other Ambulatory Visit: Payer: Self-pay | Admitting: Endocrinology

## 2014-06-23 ENCOUNTER — Ambulatory Visit: Payer: Medicare Other

## 2014-06-30 ENCOUNTER — Ambulatory Visit: Payer: Medicare Other

## 2014-06-30 ENCOUNTER — Ambulatory Visit: Payer: Medicare Other | Admitting: Cardiovascular Disease

## 2014-07-03 ENCOUNTER — Ambulatory Visit: Payer: Medicare Other | Admitting: Endocrinology

## 2014-07-07 ENCOUNTER — Other Ambulatory Visit (INDEPENDENT_AMBULATORY_CARE_PROVIDER_SITE_OTHER): Payer: Medicare Other

## 2014-07-07 ENCOUNTER — Encounter: Payer: Self-pay | Admitting: Internal Medicine

## 2014-07-07 ENCOUNTER — Ambulatory Visit (INDEPENDENT_AMBULATORY_CARE_PROVIDER_SITE_OTHER): Payer: Medicare Other | Admitting: Internal Medicine

## 2014-07-07 VITALS — BP 136/60 | HR 68 | Ht 65.75 in | Wt 222.8 lb

## 2014-07-07 DIAGNOSIS — I8500 Esophageal varices without bleeding: Secondary | ICD-10-CM

## 2014-07-07 DIAGNOSIS — K7469 Other cirrhosis of liver: Secondary | ICD-10-CM

## 2014-07-07 DIAGNOSIS — R1084 Generalized abdominal pain: Secondary | ICD-10-CM

## 2014-07-07 DIAGNOSIS — K7581 Nonalcoholic steatohepatitis (NASH): Secondary | ICD-10-CM

## 2014-07-07 LAB — COMPREHENSIVE METABOLIC PANEL
ALT: 20 U/L (ref 0–35)
AST: 20 U/L (ref 0–37)
Albumin: 3.3 g/dL — ABNORMAL LOW (ref 3.5–5.2)
Alkaline Phosphatase: 120 U/L — ABNORMAL HIGH (ref 39–117)
BILIRUBIN TOTAL: 0.6 mg/dL (ref 0.2–1.2)
BUN: 26 mg/dL — ABNORMAL HIGH (ref 6–23)
CO2: 28 meq/L (ref 19–32)
CREATININE: 1.25 mg/dL — AB (ref 0.40–1.20)
Calcium: 9 mg/dL (ref 8.4–10.5)
Chloride: 106 mEq/L (ref 96–112)
GFR: 45.76 mL/min — ABNORMAL LOW (ref >60.00)
GLUCOSE: 127 mg/dL — AB (ref 70–99)
Potassium: 4.7 mEq/L (ref 3.5–5.1)
Sodium: 137 mEq/L (ref 135–145)
Total Protein: 6.8 g/dL (ref 6.0–8.3)

## 2014-07-07 LAB — CBC WITH DIFFERENTIAL/PLATELET
BASOS ABS: 0 10*3/uL (ref 0.0–0.1)
Basophils Relative: 0.5 % (ref 0.0–3.0)
Eosinophils Absolute: 0.1 10*3/uL (ref 0.0–0.7)
Eosinophils Relative: 1.2 % (ref 0.0–5.0)
HEMATOCRIT: 33.6 % — AB (ref 36.0–46.0)
Hemoglobin: 10.9 g/dL — ABNORMAL LOW (ref 12.0–15.0)
Lymphocytes Relative: 19.4 % (ref 12.0–46.0)
Lymphs Abs: 1.5 10*3/uL (ref 0.7–4.0)
MCHC: 32.4 g/dL (ref 30.0–36.0)
MCV: 79.2 fl (ref 78.0–100.0)
MONOS PCT: 6.2 % (ref 3.0–12.0)
Monocytes Absolute: 0.5 10*3/uL (ref 0.1–1.0)
NEUTROS ABS: 5.5 10*3/uL (ref 1.4–7.7)
Neutrophils Relative %: 72.7 % (ref 43.0–77.0)
Platelets: 111 10*3/uL — ABNORMAL LOW (ref 150.0–400.0)
RBC: 4.25 Mil/uL (ref 3.87–5.11)
RDW: 19.3 % — AB (ref 11.5–15.5)
WBC: 7.6 10*3/uL (ref 4.0–10.5)

## 2014-07-07 LAB — PROTIME-INR
INR: 1.3 ratio — ABNORMAL HIGH (ref 0.8–1.0)
Prothrombin Time: 14.2 s — ABNORMAL HIGH (ref 9.6–13.1)

## 2014-07-07 NOTE — Progress Notes (Signed)
HISTORY OF PRESENT ILLNESS:  Monica [name] is a 63 y.o. 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 She was last evaluated in this office September 2014. 4 chronic right-sided abdominal discomfort. Felt to be musculoskeletal. She was to follow-up in 6 months but did not. She was to lose weight but has not. Her last upper endoscopy was in January 2013. Grade 1 varices. Relook EGD recommended 1-2 years. She presents today with a myriad of complaints. First, chronic abdominal discomfort. This has been present for years. Worse over the past 6 months. Often exacerbated by meals and associated with diarrhea and urgency. Some nausea but no vomiting. 30 pound weight gain since her last visit. She does have chronic reflux for which pantoprazole is helpful. She also takes hydrocodone and tramadol for chronic back pain. She has used dicyclomine and Lomotil for diarrhea. She has had no GI bleeding. No symptoms to suggest encephalopathy. Occasional ankle edema. She was prompted for this office visit because of a friend who developed liver cancer on the background of cirrhosis. The patient states that she does not monitor her blood sugar but takes her diabetic medications  REVIEW OF SYSTEMS:  All non-GI ROS negative except for sinus and allergy trouble, anxiety, arthritis, back pain, visual change, cough, depression, fatigue, shortness of breath, muscle cramps, itching, hearing problems, skin rash, sleeping problems, ankle edema, increased thirst, urinary frequency  Past Medical History  Diagnosis Date  . Type II or unspecified type diabetes mellitus without mention of complication, not stated as  uncontrolled   . Coronary atherosclerosis of unspecified type of vessel, native or graft   . GERD (gastroesophageal reflux disease)   . Osteoarthrosis, unspecified whether generalized or localized, unspecified site   . Obesity, unspecified   . Macular degeneration (senile) of retina, unspecified   . Unspecified sleep apnea   . Irritable bowel syndrome   . Endometrial polyp   . Hypertension   . Hiatal hernia   . Esophageal varices   . Iron deficiency anemia   . 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 Hyperlipidemia   . SOB (shortness of breath)     Normal 2D echo - EF-58, 03/18/2001  . Chest pain 09/02/2012    Normal, EF-67  . Carotid artery bruit     Doppler 07/09/2011 - abnormal study, no change from previous    Past Surgical History  Procedure Laterality Date  . Appendectomy    . Cholecystectomy    . Carotid endarterectomy    . Cardiac catheterization  03/18/2007    Normal coronary arteries and LV function  . Cardiac catheterization  07/01/2001    Normal cath  . Cardiac catheterization  05/17/1997    Normal cath    Social History Monica [name]  reports that she has been smoking Cigarettes.  She has been smoking about 1.00 pack per day. She has never used smokeless tobacco. She reports that she does not drink alcohol or use illicit drugs.  family history includes Breast cancer in her mother and sister; Colon cancer in her paternal aunt; Diabetes in her brother, father, and sister; Stomach cancer in her paternal aunt.  Allergies  Allergen Reactions  . Augmentin [Amoxicillin-Pot Clavulanate]   .   Biaxin [Clarithromycin]   . Invokana [Canagliflozin]   . Latex   . Penicillins   . Sulfonamide Derivatives        PHYSICAL EXAMINATION: Vital signs: BP 136/60 mmHg  Pulse 68  Ht 5' 5.75" (1.67 m)  Wt 222 lb 12.8 oz (101.061 kg)  BMI 36.24 kg/m2  Constitutional: Chronically ill appearing, no acute  distress. Obese. Thin hair Psychiatric: alert and oriented x3, cooperative Eyes: extraocular movements intact, anicteric, conjunctiva pink Mouth: oral pharynx moist, no lesions Neck: supple no lymphadenopathy Cardiovascular: heart regular rate and rhythm, no murmur Lungs: clear to auscultation bilaterally Abdomen: Obese, soft, nontender, nondistended, no obvious ascites, no peritoneal signs, normal bowel sounds, no organomegaly. Abdominal scarring from prior surgeries Extremities: no lower extremity edema bilaterally Skin: no lesions on visible extremities Neuro: No focal deficits. No asterixis.    ASSESSMENT:  #1. Abdominal pain. Likely secondary to IBS #2. Diarrhea predominant IBS #3. NASH related cirrhosis with portal hypertension. Last EGD January 2013. Last imaging CT scan October 2012 with cirrhosis #4. Multiple significant medical problems #5. Medical noncompliance   PLAN:  #1. CBC, comprehensive metabolic panel, PT/INR, AFP today #2. Abdominal ultrasound to evaluate pain and evaluate the liver #3. Upper endoscopy to evaluate pain and resurvey varices. She is high-risk due to her comorbidities. The patient will stay on Plavix. Hold diabetic medications the day of the procedure to avoid unwanted hypoglycemia. #4. The importance of medical compliance stressed

## 2014-07-07 NOTE — Patient Instructions (Signed)
Your physician has requested that you go to the basement for lab work before leaving today  You have been [facility] of hospital) on 07/11/2014 at 10:00am. Please arrive 15 minutes prior to your appointment for registration. Make certain not to have anything to eat or drink after midnight prior to your appointment. Should you need to reschedule your appointment, please contact radiology at [phone]. This test typically takes about 30 minutes to perform.   You have been scheduled for an endoscopy. Please follow written instructions given to you at your visit today. If you use inhalers (even only as needed), please bring them with you on the day of your procedure. Your physician has requested that you go to www.startemmi.com and enter the access code given to you at your visit today. This web site gives a general overview about your procedure. However, you should still follow specific instructions given to you by our office regarding your preparation for the procedure.

## 2014-07-08 LAB — AFP TUMOR MARKER: AFP-Tumor Marker: 2.7 ng/mL (ref <6.1)

## 2014-07-11 ENCOUNTER — Ambulatory Visit (HOSPITAL_COMMUNITY)
Admission: RE | Admit: 2014-07-11 | Discharge: 2014-07-11 | Disposition: A | Discharge status: Home / Self Care | Payer: Medicare Other | Source: Ambulatory Visit | Attending: Internal Medicine | Admitting: Internal Medicine

## 2014-07-11 DIAGNOSIS — Z9049 Acquired absence of other specified parts of digestive tract: Secondary | ICD-10-CM | POA: Diagnosis not present

## 2014-07-11 DIAGNOSIS — R1084 Generalized abdominal pain: Secondary | ICD-10-CM

## 2014-07-11 DIAGNOSIS — K7460 Unspecified cirrhosis of liver: Secondary | ICD-10-CM | POA: Insufficient documentation

## 2014-07-11 DIAGNOSIS — K7469 Other cirrhosis of liver: Secondary | ICD-10-CM

## 2014-07-11 DIAGNOSIS — R161 Splenomegaly, not elsewhere classified: Secondary | ICD-10-CM | POA: Diagnosis not present

## 2014-07-11 DIAGNOSIS — R109 Unspecified abdominal pain: Secondary | ICD-10-CM | POA: Diagnosis present

## 2014-07-11 DIAGNOSIS — R188 Other ascites: Secondary | ICD-10-CM | POA: Insufficient documentation

## 2014-07-17 ENCOUNTER — Telehealth: Payer: Self-pay | Admitting: Internal Medicine

## 2014-07-17 NOTE — Telephone Encounter (Signed)
Pt wants to know if she really needs to have EGD done since US did not show anything. She is scheduled for 07/19/14 but states she cannot come that day due to transportation. Please advise.

## 2014-07-17 NOTE — Telephone Encounter (Signed)
Still needs EGD. Arrange on a day when she has adequate transportation

## 2014-07-18 NOTE — Telephone Encounter (Signed)
Spoke with pt and she is aware. States she cannot come to appt tomorrow so EGD cancelled. Pt states she will look at her calendar and call back to reschedule procedure for next month.

## 2014-07-19 ENCOUNTER — Encounter: Payer: Medicare Other | Admitting: Internal Medicine

## 2014-07-21 ENCOUNTER — Ambulatory Visit: Payer: Medicare Other

## 2014-07-27 ENCOUNTER — Telehealth: Payer: Self-pay | Admitting: Endocrinology

## 2014-07-27 NOTE — Telephone Encounter (Signed)
Pt will be out of lantus by tomorrow. She cannot come on mon or wednes next week. She is changing providers to go to an endo MD closer to her home. Can we cover her until her new md sees her.

## 2014-07-27 NOTE — Telephone Encounter (Signed)
There is no endocrinologist in [place].  She has not been seen since 6/15.  Can she get her Lantus from PCP?

## 2014-08-01 ENCOUNTER — Ambulatory Visit (HOSPITAL_COMMUNITY)
Admission: RE | Admit: 2014-08-01 | Discharge: 2014-08-01 | Disposition: A | Discharge status: Home / Self Care | Payer: Medicare Other | Source: Ambulatory Visit | Attending: Cardiovascular Disease | Admitting: Cardiovascular Disease

## 2014-08-01 DIAGNOSIS — I6521 Occlusion and stenosis of right carotid artery: Secondary | ICD-10-CM

## 2014-08-01 DIAGNOSIS — I6529 Occlusion and stenosis of unspecified carotid artery: Secondary | ICD-10-CM | POA: Diagnosis present

## 2014-08-01 NOTE — Progress Notes (Signed)
Carotid Duplex Completed. °Brianna L Mazza,RVT °

## 2014-08-07 ENCOUNTER — Encounter: Payer: Self-pay | Admitting: *Deleted

## 2014-08-15 ENCOUNTER — Encounter: Payer: Self-pay | Admitting: Cardiovascular Disease

## 2014-08-15 ENCOUNTER — Ambulatory Visit (INDEPENDENT_AMBULATORY_CARE_PROVIDER_SITE_OTHER): Payer: Medicare Other | Admitting: Cardiovascular Disease

## 2014-08-15 ENCOUNTER — Ambulatory Visit
Admission: RE | Admit: 2014-08-15 | Discharge: 2014-08-15 | Disposition: A | Discharge status: Home / Self Care | Payer: Medicare Other | Source: Ambulatory Visit | Attending: Internal Medicine | Admitting: Internal Medicine

## 2014-08-15 VITALS — BP 142/72 | HR 84 | Ht 65.75 in | Wt 239.0 lb

## 2014-08-15 DIAGNOSIS — I10 Essential (primary) hypertension: Secondary | ICD-10-CM | POA: Diagnosis not present

## 2014-08-15 DIAGNOSIS — Z79899 Other long term (current) drug therapy: Secondary | ICD-10-CM | POA: Diagnosis not present

## 2014-08-15 DIAGNOSIS — Z720 Tobacco use: Secondary | ICD-10-CM

## 2014-08-15 DIAGNOSIS — R609 Edema, unspecified: Secondary | ICD-10-CM

## 2014-08-15 DIAGNOSIS — I739 Peripheral vascular disease, unspecified: Secondary | ICD-10-CM

## 2014-08-15 DIAGNOSIS — R0602 Shortness of breath: Secondary | ICD-10-CM

## 2014-08-15 DIAGNOSIS — I6529 Occlusion and stenosis of unspecified carotid artery: Secondary | ICD-10-CM | POA: Diagnosis not present

## 2014-08-15 DIAGNOSIS — I2510 Atherosclerotic heart disease of native coronary artery without angina pectoris: Secondary | ICD-10-CM | POA: Diagnosis not present

## 2014-08-15 DIAGNOSIS — R600 Localized edema: Secondary | ICD-10-CM | POA: Diagnosis not present

## 2014-08-15 DIAGNOSIS — Z1231 Encounter for screening mammogram for malignant neoplasm of breast: Secondary | ICD-10-CM

## 2014-08-15 DIAGNOSIS — I779 Disorder of arteries and arterioles, unspecified: Secondary | ICD-10-CM

## 2014-08-15 MED ORDER — FUROSEMIDE 40 MG PO TABS: 40.0000 mg | ORAL_TABLET | Freq: Two times a day (BID) | ORAL | Status: DC

## 2014-08-15 NOTE — Patient Instructions (Signed)
  We will see you back in follow up in 3 months with Dr Allyson SabalBerry.   Dr Allyson SabalBerry has ordered: 1.  Echocardiogram. Echocardiography is a painless test that uses sound waves to create images of your heart. It provides your doctor with information about the size and shape of your heart and how well your heart's chambers and valves are working. This procedure takes approximately one hour. There are no restrictions for this procedure.  2. Increase furosemide to 40mg  twice a day.   3. Your physician recommends that you return for lab work in: 3 weeks   4. Compression hose    Compression Stockings Compression stockings are elastic stockings that "compress" your legs. This helps to increase blood flow, decrease swelling, and reduces the chance of getting blood clots in your lower legs. Compression stockings are used:  After surgery.  If you have a history of poor circulation.  If you are prone to blood clots.  If you have varicose veins.  If you sit or are bedridden for long periods of time. WEARING COMPRESSION STOCKINGS  Your compression stockings should be worn as instructed by your caregiver.  Wearing the correct stocking size is important. Your caregiver can help measure and fit you to the correct size.  When wearing your stockings, do not allow the stockings to bunch up. This is especially important around your toes or behind your knees. Keep the stockings as smooth as possible.  Do not roll the stockings downward and leave them rolled down. This can form a restrictive band around your legs and can decrease blood flow.  The stockings should be removed once a day for 1 hour or as instructed by your caregiver. When the stockings are taken off, inspect your legs and feet. Look for:  Open sores.  Red spots.  Puffy areas (swelling).  Anything that does not seem normal. IMPORTANT INFORMATION ABOUT COMPRESSION STOCKINGS  The compression stockings should be clean, dry, and in good  condition before you put them on.  Do not put lotion on your legs or feet. This makes it harder to put the stockings on.  Change your stockings immediately if they become wet or soiled.  Do not wear stockings that are ripped or torn.  You may hand-wash or put your stockings in the washing machine. Use cold or warm water with mild detergent. Do not bleach your stockings. They may be air-dried or dried in the dryer on low heat.  If you have pain or have a feeling of "pins and needles" in your feet or legs, you may be wearing stockings that are too tight. Call your caregiver right away. SEEK IMMEDIATE MEDICAL CARE IF:   You have numbness or tingling in your lower legs that does not get better quickly after the stockings are removed.  Your toes or feet become cold and blue.  You develop open sores or have red spots on your legs that do not go away. MAKE SURE YOU:   Understand these instructions.  Will watch your condition.  Will get help right away if you are not doing well or get worse. Document Released: 03/02/2009 Document Revised: 07/28/2011 Document Reviewed: 03/02/2009 [facility] Patient Information 2014 [employer], [place].

## 2014-08-15 NOTE — Assessment & Plan Note (Signed)
Still smoking one pack per day, recalcitrant to risk factor modification

## 2014-08-15 NOTE — Assessment & Plan Note (Signed)
The patient has had several cardiac catheterizations before, one in 1998 and a second one in 2008 after false positive Myoview which were completely normal.

## 2014-08-15 NOTE — Assessment & Plan Note (Signed)
History of hyperlipidemia on some statin 40 mg a day followed by her PCP. Her last lipid profile in our chart performed one year ago revealed total cholesterol 148, LDL 80 and HDL of 51

## 2014-08-15 NOTE — Assessment & Plan Note (Signed)
.  History of a known occluded right internal carotid artery with remote left carotid endarterectomy performed by Dr. Liliane BadeGreg Hayes..Her most recent carotid Doppler study performed 08/01/14 revealed an occluded right internal carotid artery with mild left ICA stenosis unchanged from the prior study

## 2014-08-15 NOTE — Assessment & Plan Note (Signed)
The patient has gained 40 pounds since I saw her 2 years ago. She has 2-3+ pitting edema. She is on Lasix 40 mg in the morning and 20 in the evening. She feels weak fatigue and dyspneic. I'm going to increase her Lasix to 40 mg by mouth twice a day, get a 2-D echocardiogram for LV function and prescribed knee-high 20-30 mm compression stockings. She knows to avoid salt and to elevate her legs. I will see back in 3 months.

## 2014-08-15 NOTE — Progress Notes (Signed)
08/15/2014 Mel Almond   July 01, 1950  [id number]  Primary Physician Galvin Proffer, MD Primary Cardiologist: Runell Gess MD Roseanne Reno   HPI:  Ms. Madrid is a 64 year old severely overweight married Caucasian female mother of 2 was accompanied by her husband today. I last saw her 2 years ago. She has a history of normal coronary arteries by cardiac catheterization in 1998 and again in 2008 after false positive Myoview. She does have peripheral vascular disease with a known occluded right internal carotid artery status post remote left carotid endarterectomy performed by Dr. Madilyn Fireman which we followed by duplex ultrasound most recently earlier this month revealing her endarterectomy site to be widely patent. Her problems include continued tobacco abuse one pack per day, treated hypertension, diabetes and hyperlipidemia. She does complain of dyspnea. She denies chest pain. Her major issue is with anemia for which she is seeing Dr. Yancey Flemings as well as bilateral lower extremity edema. She has gained 40 pounds since I saw her 2 years ago.   Current Outpatient Prescriptions  Medication Sig Dispense Refill  . ALPRAZolam (XANAX) 1 MG tablet Take 1 mg by mouth 2 (two) times daily as needed.     . Ascorbic Acid (VITAMIN C) 1000 MG tablet Take 1,000 mg by mouth daily.      . clopidogrel (PLAVIX) 75 MG tablet Take 75 mg by mouth daily.      Marland Kitchen dicyclomine (BENTYL) 20 MG tablet Take 20 mg by mouth every 6 (six) hours.    . diphenoxylate-atropine (LOMOTIL) 2.5-0.025 MG per tablet Take 1 tablet by mouth 4 (four) times daily as needed for diarrhea or loose stools.    . furosemide (LASIX) 40 MG tablet Take 1 tablet (40 mg total) by mouth 2 (two) times daily. 60 tablet 6  . LANTUS SOLOSTAR 100 UNIT/ML Solostar Pen INJECT 20 TO 24 UNITS TWICE DAILY 15 mL 0  . levothyroxine (SYNTHROID, LEVOTHROID) 75 MCG tablet Take 75 mcg by mouth daily. Brand Name Only    . metoprolol (TOPROL-XL) 50 MG 24  hr tablet Take 50 mg by mouth daily.      Marland Kitchen NOVOLOG FLEXPEN 100 UNIT/ML FlexPen INJECT 20 UNITS INTO THE SKIN 3 TIMES DAILY WITH MEALS 15 mL 0  . oxyCODONE (OXY IR/ROXICODONE) 5 MG immediate release tablet Take 5 mg by mouth as needed.     . pantoprazole (PROTONIX) 40 MG tablet     . promethazine (PHENERGAN) 25 MG tablet Take 25 mg by mouth every 6 (six) hours as needed.      . simvastatin (ZOCOR) 40 MG tablet Take 40 mg by mouth. Three times weekly    . traMADol (ULTRAM) 50 MG tablet Take 50 mg by mouth every 6 (six) hours as needed. Maximum dose= 8 tablets per day      Current Facility-Administered Medications  Medication Dose Route Frequency Provider Last Rate Last Dose  . 0.9 %  sodium chloride infusion  500 mL Intravenous Continuous Hilarie Fredrickson, MD        Allergies  Allergen Reactions  . Augmentin [Amoxicillin-Pot Clavulanate]   . Biaxin [Clarithromycin]   . Invokana [Canagliflozin]   . Latex   . Penicillins   . Sulfonamide Derivatives     History   Social History  . Marital Status: Married    Spouse Name: N/A  . Number of Children: 2  . Years of Education: N/A   Occupational History  . Retired    Social History  Main Topics  . Smoking status: Current Every Day Smoker -- 1.00 packs/day    Types: Cigarettes  . Smokeless tobacco: Never Used     Comment: form given on 05-26-11  . Alcohol Use: No  . Drug Use: No  . Sexual Activity: Not on file   Other Topics Concern  . Not on file   Social History Narrative     Review of Systems: General: negative for chills, fever, night sweats or weight changes.  Cardiovascular: negative for chest pain, dyspnea on exertion, edema, orthopnea, palpitations, paroxysmal nocturnal dyspnea or shortness of breath Dermatological: negative for rash Respiratory: negative for cough or wheezing Urologic: negative for hematuria Abdominal: negative for nausea, vomiting, diarrhea, bright red blood per rectum, melena, or  hematemesis Neurologic: negative for visual changes, syncope, or dizziness All other systems reviewed and are otherwise negative except as noted above.    Blood pressure 142/72, pulse 84, height 5' 5.75" (1.67 m), weight 239 lb (108.41 kg).  General appearance: alert and no distress Neck: no adenopathy, no carotid bruit, no JVD, supple, symmetrical, trachea midline and thyroid not enlarged, symmetric, no tenderness/mass/nodules Lungs: clear to auscultation bilaterally Heart: regular rate and rhythm, S1, S2 normal, no murmur, click, rub or gallop Extremities: 2+ pitting edema bilaterally  EKG sinus rhythm with sinus arrhythmia. I personally reviewed this EKG  ASSESSMENT AND PLAN:   Other and unspecified hyperlipidemia History of hyperlipidemia on some statin 40 mg a day followed by her PCP. Her last lipid profile in our chart performed one year ago revealed total cholesterol 148, LDL 80 and HDL of 51   Coronary atherosclerosis The patient has had several cardiac catheterizations before, one in 1998 and a second one in 2008 after false positive Myoview which were completely normal.   Bilateral carotid artery disease .History of a known occluded right internal carotid artery with remote left carotid endarterectomy performed by Dr. Liliane BadeGreg Hayes..Her most recent carotid Doppler study performed 08/01/14 revealed an occluded right internal carotid artery with mild left ICA stenosis unchanged from the prior study   Bilateral lower extremity edema The patient has gained 40 pounds since I saw her 2 years ago. She has 2-3+ pitting edema. She is on Lasix 40 mg in the morning and 20 in the evening. She feels weak fatigue and dyspneic. I'm going to increase her Lasix to 40 mg by mouth twice a day, get a 2-D echocardiogram for LV function and prescribed knee-high 20-30 mm compression stockings. She knows to avoid salt and to elevate her legs. I will see back in 3 months.   Tobacco abuse Still smoking  one pack per day, recalcitrant to risk factor modification       Runell GessJonathan J. Berry MD [facility] For ChildrenFACP,FACC,FAHA, [facility] HospitalFSCAI 08/15/2014 4:34 PM

## 2014-09-04 ENCOUNTER — Inpatient Hospital Stay (HOSPITAL_COMMUNITY): Admission: RE | Admit: 2014-09-04 | Payer: Medicare Other | Source: Ambulatory Visit

## 2014-09-11 ENCOUNTER — Other Ambulatory Visit: Payer: Self-pay | Admitting: Cardiovascular Disease

## 2014-09-11 DIAGNOSIS — I4891 Unspecified atrial fibrillation: Secondary | ICD-10-CM

## 2014-09-12 ENCOUNTER — Other Ambulatory Visit (HOSPITAL_COMMUNITY): Payer: Self-pay | Admitting: Cardiovascular Disease

## 2014-09-19 ENCOUNTER — Other Ambulatory Visit: Payer: Self-pay | Admitting: *Deleted

## 2014-09-19 DIAGNOSIS — R0602 Shortness of breath: Secondary | ICD-10-CM

## 2014-09-19 NOTE — Progress Notes (Signed)
Cupid order placed for echo 

## 2014-09-20 ENCOUNTER — Telehealth: Payer: Self-pay | Admitting: Cardiovascular Disease

## 2014-09-20 NOTE — Telephone Encounter (Signed)
Closed encounter °

## 2014-09-22 ENCOUNTER — Other Ambulatory Visit (HOSPITAL_COMMUNITY): Payer: Medicare Other

## 2014-10-18 ENCOUNTER — Other Ambulatory Visit: Payer: Self-pay | Admitting: Endocrinology

## 2014-10-27 ENCOUNTER — Ambulatory Visit (HOSPITAL_COMMUNITY)
Admission: RE | Admit: 2014-10-27 | Discharge: 2014-10-27 | Disposition: A | Discharge status: Home / Self Care | Payer: Medicare Other | Source: Ambulatory Visit | Attending: Cardiology | Admitting: Cardiology

## 2014-10-27 DIAGNOSIS — R0602 Shortness of breath: Secondary | ICD-10-CM

## 2014-10-27 DIAGNOSIS — R0600 Dyspnea, unspecified: Secondary | ICD-10-CM | POA: Diagnosis present

## 2014-10-27 DIAGNOSIS — I517 Cardiomegaly: Secondary | ICD-10-CM | POA: Insufficient documentation

## 2014-10-31 ENCOUNTER — Ambulatory Visit: Payer: Medicare Other | Admitting: Internal Medicine

## 2014-11-06 NOTE — Progress Notes (Signed)
A 2-D echo became in my review reports shoulder

## 2014-11-08 ENCOUNTER — Ambulatory Visit (INDEPENDENT_AMBULATORY_CARE_PROVIDER_SITE_OTHER): Payer: Medicare Other | Admitting: Cardiovascular Disease

## 2014-11-08 ENCOUNTER — Encounter: Payer: Self-pay | Admitting: Cardiovascular Disease

## 2014-11-08 VITALS — BP 152/62 | HR 72 | Ht 65.0 in | Wt 228.0 lb

## 2014-11-08 DIAGNOSIS — I6529 Occlusion and stenosis of unspecified carotid artery: Secondary | ICD-10-CM | POA: Diagnosis not present

## 2014-11-08 DIAGNOSIS — Z720 Tobacco use: Secondary | ICD-10-CM

## 2014-11-08 DIAGNOSIS — I779 Disorder of arteries and arterioles, unspecified: Secondary | ICD-10-CM

## 2014-11-08 DIAGNOSIS — R600 Localized edema: Secondary | ICD-10-CM

## 2014-11-08 DIAGNOSIS — I739 Peripheral vascular disease, unspecified: Secondary | ICD-10-CM

## 2014-11-08 NOTE — Assessment & Plan Note (Signed)
She had carotid artery disease status post remote left carotid endarterectomy by Dr. Madilyn Fireman. Carotid Dopplers demonstrated an occluded right internal carotid artery and moderate left ICA stenosis which we continue to follow

## 2014-11-08 NOTE — Assessment & Plan Note (Signed)
1-2+ pitting bilateral lower extremity edema probably related to diastolic dysfunction and venous reflux on oral diuretics.Marland Kitchen

## 2014-11-08 NOTE — Progress Notes (Signed)
11/08/2014 Monica [name]   1950/09/15  [id number]  Primary Physician Galvin Proffer, MD Primary Cardiologist: Runell Gess MD Roseanne Reno   HPI:   Monica [name] is a 64 year old severely overweight married Caucasian female mother of 2 was accompanied by her husband today. I last saw her 3 months ago.. She has a history of normal coronary arteries by cardiac catheterization in 1998 and again in 2008 after false positive Myoview. She does have peripheral vascular disease with a known occluded right internal carotid artery status post remote left carotid endarterectomy performed by Dr. Madilyn Fireman which we followed by duplex ultrasound most recently earlier this month revealing her endarterectomy site to be widely patent. Her problems include continued tobacco abuse one pack per day, treated hypertension, diabetes and hyperlipidemia. She does complain of dyspnea. She denies chest pain. Her major issue is with anemia for which she is seeing Dr. Yancey Flemings as well as bilateral lower extremity edema. She has gained 40 pounds since I saw her 2 years ago. Recent 2-D echo revealed normal LV systolic function with grade 1 diastolic dysfunction. Her major complaints are of dyspnea on exertion although she does have COPD with ongoing tobacco abuse.   Current Outpatient Prescriptions  Medication Sig Dispense Refill  . ALPRAZolam (XANAX) 1 MG tablet Take 1 mg by mouth 2 (two) times daily as needed.     . clopidogrel (PLAVIX) 75 MG tablet Take 75 mg by mouth daily.      . furosemide (LASIX) 40 MG tablet Take 1 tablet (40 mg total) by mouth 2 (two) times daily. 60 tablet 6  . LANTUS SOLOSTAR 100 UNIT/ML Solostar Pen INJECT 20 TO 24 UNITS TWICE DAILY 15 mL 0  . levothyroxine (SYNTHROID, LEVOTHROID) 75 MCG tablet Take 75 mcg by mouth daily. Brand Name Only    . metoprolol (TOPROL-XL) 50 MG 24 hr tablet Take 50 mg by mouth daily.      Marland Kitchen NOVOLOG FLEXPEN 100 UNIT/ML FlexPen INJECT 20 UNITS INTO THE SKIN  3 TIMES DAILY WITH MEALS 15 mL 0  . oxyCODONE (OXY IR/ROXICODONE) 5 MG immediate release tablet Take 5 mg by mouth as needed.     . pantoprazole (PROTONIX) 40 MG tablet Take 40 mg by mouth daily.     . promethazine (PHENERGAN) 25 MG tablet Take 25 mg by mouth every 6 (six) hours as needed.      . simvastatin (ZOCOR) 40 MG tablet Take 40 mg by mouth. Three times weekly    . traMADol (ULTRAM) 50 MG tablet Take 50 mg by mouth every 6 (six) hours as needed. Maximum dose= 8 tablets per day      Current Facility-Administered Medications  Medication Dose Route Frequency Provider Last Rate Last Dose  . 0.9 %  sodium chloride infusion  500 mL Intravenous Continuous Hilarie Fredrickson, MD        Allergies  Allergen Reactions  . Augmentin [Amoxicillin-Pot Clavulanate]   . Biaxin [Clarithromycin]   . Invokana [Canagliflozin]   . Latex   . Penicillins   . Sulfonamide Derivatives     History   Social History  . Marital Status: Married    Spouse Name: N/A  . Number of Children: 2  . Years of Education: N/A   Occupational History  . Retired    Social History Main Topics  . Smoking status: Current Every Day Smoker -- 1.00 packs/day    Types: Cigarettes  . Smokeless tobacco: Never Used  Comment: form given on 05-26-11  . Alcohol Use: No  . Drug Use: No  . Sexual Activity: Not on file   Other Topics Concern  . Not on file   Social History Narrative     Review of Systems: General: negative for chills, fever, night sweats or weight changes.  Cardiovascular: negative for chest pain, dyspnea on exertion, edema, orthopnea, palpitations, paroxysmal nocturnal dyspnea or shortness of breath Dermatological: negative for rash Respiratory: negative for cough or wheezing Urologic: negative for hematuria Abdominal: negative for nausea, vomiting, diarrhea, bright red blood per rectum, melena, or hematemesis Neurologic: negative for visual changes, syncope, or dizziness All other systems reviewed  and are otherwise negative except as noted above.    Blood pressure 152/62, pulse 72, height  (1.651 m), weight 228 lb (103.42 kg).  General appearance: alert and no distress Neck: no adenopathy, no carotid bruit, no JVD, supple, symmetrical, trachea midline and thyroid not enlarged, symmetric, no tenderness/mass/nodules Lungs: clear to auscultation bilaterally Heart: regular rate and rhythm, S1, S2 normal, no murmur, click, rub or gallop Extremities: 1-2+ pitting edema bilaterally  EKG not performed today  ASSESSMENT AND PLAN:   Tobacco abuse Continues to smoke a pack a day recalcitrant to factor modification  Bilateral lower extremity edema 1-2+ pitting bilateral lower extremity edema probably related to diastolic dysfunction and venous reflux on oral diuretics..  Bilateral carotid artery disease She had carotid artery disease status post remote left carotid endarterectomy by Dr. Madilyn Fireman. Carotid Dopplers demonstrated an occluded right internal carotid artery and moderate left ICA stenosis which we continue to follow  Other and unspecified hyperlipidemia History of hyperlipidemia on simvastatin followed by her PCP      Runell Gess MD [facility], [facility] 11/08/2014 4:11 PM

## 2014-11-08 NOTE — Patient Instructions (Signed)
Your physician recommends that you schedule a follow-up appointment in 6 months with an extender. Dr Berry recommends that you schedule a follow-up appointment in 1 year. You will receive a reminder letter in the mail two months in advance. If you don't receive a letter, please call our office to schedule the follow-up appointment. 

## 2014-11-08 NOTE — Assessment & Plan Note (Signed)
Continues to smoke a pack a day recalcitrant to factor modification

## 2014-11-08 NOTE — Assessment & Plan Note (Signed)
History of hyperlipidemia on simvastatin followed by her PCP 

## 2015-02-22 ENCOUNTER — Telehealth: Payer: Self-pay

## 2015-02-22 ENCOUNTER — Encounter: Payer: Self-pay | Admitting: Gastroenterology

## 2015-02-22 ENCOUNTER — Other Ambulatory Visit (INDEPENDENT_AMBULATORY_CARE_PROVIDER_SITE_OTHER): Payer: Medicare Other

## 2015-02-22 ENCOUNTER — Ambulatory Visit (INDEPENDENT_AMBULATORY_CARE_PROVIDER_SITE_OTHER): Payer: Medicare Other | Admitting: Gastroenterology

## 2015-02-22 VITALS — BP 110/50 | HR 68 | Ht 64.0 in | Wt 255.0 lb

## 2015-02-22 DIAGNOSIS — I6529 Occlusion and stenosis of unspecified carotid artery: Secondary | ICD-10-CM

## 2015-02-22 DIAGNOSIS — K7031 Alcoholic cirrhosis of liver with ascites: Secondary | ICD-10-CM | POA: Diagnosis not present

## 2015-02-22 DIAGNOSIS — Z1211 Encounter for screening for malignant neoplasm of colon: Secondary | ICD-10-CM

## 2015-02-22 DIAGNOSIS — R600 Localized edema: Secondary | ICD-10-CM

## 2015-02-22 DIAGNOSIS — K7469 Other cirrhosis of liver: Secondary | ICD-10-CM

## 2015-02-22 DIAGNOSIS — R188 Other ascites: Secondary | ICD-10-CM | POA: Diagnosis not present

## 2015-02-22 DIAGNOSIS — I8500 Esophageal varices without bleeding: Secondary | ICD-10-CM

## 2015-02-22 LAB — CBC WITH DIFFERENTIAL/PLATELET
BASOS ABS: 0.1 10*3/uL (ref 0.0–0.1)
Basophils Relative: 1.7 % (ref 0.0–3.0)
EOS ABS: 0.2 10*3/uL (ref 0.0–0.7)
Eosinophils Relative: 2.5 % (ref 0.0–5.0)
HEMATOCRIT: 30.5 % — AB (ref 36.0–46.0)
HEMOGLOBIN: 10 g/dL — AB (ref 12.0–15.0)
LYMPHS PCT: 16.1 % (ref 12.0–46.0)
Lymphs Abs: 1.2 10*3/uL (ref 0.7–4.0)
MCHC: 32.9 g/dL (ref 30.0–36.0)
MCV: 85.7 fl (ref 78.0–100.0)
Monocytes Absolute: 0.6 10*3/uL (ref 0.1–1.0)
Monocytes Relative: 7.4 % (ref 3.0–12.0)
NEUTROS ABS: 5.5 10*3/uL (ref 1.4–7.7)
Neutrophils Relative %: 72.3 % (ref 43.0–77.0)
PLATELETS: 115 10*3/uL — AB (ref 150.0–400.0)
RBC: 3.55 Mil/uL — ABNORMAL LOW (ref 3.87–5.11)
RDW: 15.9 % — ABNORMAL HIGH (ref 11.5–15.5)
WBC: 7.6 10*3/uL (ref 4.0–10.5)

## 2015-02-22 LAB — PROTIME-INR
INR: 1.4 ratio — ABNORMAL HIGH (ref 0.8–1.0)
PROTHROMBIN TIME: 14.9 s — AB (ref 9.6–13.1)

## 2015-02-22 LAB — COMPREHENSIVE METABOLIC PANEL
ALT: 10 U/L (ref 0–35)
AST: 17 U/L (ref 0–37)
Albumin: 2.9 g/dL — ABNORMAL LOW (ref 3.5–5.2)
Alkaline Phosphatase: 92 U/L (ref 39–117)
BILIRUBIN TOTAL: 0.7 mg/dL (ref 0.2–1.2)
BUN: 23 mg/dL (ref 6–23)
CALCIUM: 8.5 mg/dL (ref 8.4–10.5)
CO2: 28 meq/L (ref 19–32)
CREATININE: 1.23 mg/dL — AB (ref 0.40–1.20)
Chloride: 108 mEq/L (ref 96–112)
GFR: 46.53 mL/min — ABNORMAL LOW (ref >60.00)
Glucose, Bld: 173 mg/dL — ABNORMAL HIGH (ref 70–99)
Potassium: 4.8 mEq/L (ref 3.5–5.1)
Sodium: 141 mEq/L (ref 135–145)
Total Protein: 6 g/dL (ref 6.0–8.3)

## 2015-02-22 MED ORDER — SPIRONOLACTONE 100 MG PO TABS: 100.0000 mg | ORAL_TABLET | Freq: Two times a day (BID) | ORAL | Status: DC

## 2015-02-22 NOTE — Patient Instructions (Signed)
Your physician has requested that you go to the basement for lab work before leaving today.  Please have your labs drawn again in one week at the lab of your choice in [place]. Please have them fax the results to [phone].  Stop taking metolazone.  We have sent the following medications to your pharmacy for you to pick up at your convenience:spironolactone.  You have been scheduled for an endoscopy and colonoscopy. Please follow the written instructions given to you at your visit today. Please pick up your prep supplies at the pharmacy within the next 1-3 days. If you use inhalers (even only as needed), please bring them with you on the day of your procedure.

## 2015-02-22 NOTE — Telephone Encounter (Signed)
  02/22/2015   RE: Monica [name] DOB: Nov 03, 1950 MRN: [id number]   Dear Dr. Allyson Sabal,    We have scheduled the above patient for an endoscopic procedure. Our records show that she is on anticoagulation therapy.   Please advise as to how long the patient may come off her therapy of Plavix prior to the procedure, which is scheduled for 05/03/15.  Please  route the completed form to [facility].   Sincerely,    Christie Nottingham, CMA

## 2015-02-22 NOTE — Telephone Encounter (Signed)
Can interrupt her antiplatelets therapy for her GI procedure

## 2015-02-22 NOTE — Telephone Encounter (Signed)
We normally hold Plavix for 5 days. Is this time frame ok?

## 2015-02-23 LAB — AFP TUMOR MARKER: AFP TUMOR MARKER: 3.1 ng/mL (ref <6.1)

## 2015-02-23 NOTE — Telephone Encounter (Signed)
Left a message for patient to call back. 

## 2015-02-23 NOTE — Telephone Encounter (Signed)
Yes, 5 days is fine

## 2015-02-23 NOTE — Telephone Encounter (Signed)
Patient returned my call and I informed her that per Dr. Allyson Sabal she can hold her Plavix 5 days prior to her procedure. Pt verbalized understanding.

## 2015-03-05 ENCOUNTER — Encounter: Payer: Self-pay | Admitting: Gastroenterology

## 2015-03-05 ENCOUNTER — Telehealth: Payer: Self-pay | Admitting: *Deleted

## 2015-03-05 DIAGNOSIS — K7460 Unspecified cirrhosis of liver: Secondary | ICD-10-CM | POA: Insufficient documentation

## 2015-03-05 DIAGNOSIS — Z1211 Encounter for screening for malignant neoplasm of colon: Secondary | ICD-10-CM | POA: Insufficient documentation

## 2015-03-05 DIAGNOSIS — K7581 Nonalcoholic steatohepatitis (NASH): Secondary | ICD-10-CM

## 2015-03-05 DIAGNOSIS — I8500 Esophageal varices without bleeding: Secondary | ICD-10-CM | POA: Insufficient documentation

## 2015-03-05 DIAGNOSIS — R188 Other ascites: Secondary | ICD-10-CM | POA: Insufficient documentation

## 2015-03-05 DIAGNOSIS — R600 Localized edema: Secondary | ICD-10-CM | POA: Insufficient documentation

## 2015-03-05 NOTE — Progress Notes (Signed)
02/22/2015 Monica [name] [id number] 02-28-1951   History of Present Illness: This is a 64 year old female who is known to Dr. 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 She was last seen in February 2016 at which time she was scheduled for an EGD for variceal screening, but never had the procedure performed or returned for follow-up until now. Just of note, she was over 25 minutes late for her appointment today.   She is here today again at the request of her PCP for her cirrhosis with abdominal distention with ascites on recent imaging, weight gain, moderate extremity edema.  The patient had a CT scan of the abdomen and pelvis with contrast at [facility] in September 2016 that showed advanced cirrhosis with no liver mass, sequela of portal hypertension including stable mild splenomegaly, periumbilical and superficial venous varices and increased small to moderate volume ascites with worsened moderate anasarca, stable nonspecific mild to moderate retroperitoneal adenopathy, and stable moderate periumbilical hernia containing ascites without appreciable bowel loops in the hernia sac with no bowel obstruction or acute bowel inflammation. This was in comparison to CT scan in June 2016.  She was seen by her PCP just 2 days ago at which time her metoprolol was discontinued and she was started on propanolol 60 mg twice daily. She was also started on metolazone 5 mg daily. Labs were performed but we do not have those results currently. She is currently on Lasix 40 mg twice daily as well, which she has been on for quite some time. She continues on pantoprazole 40 mg daily. She has in fact gained 33 pounds since her last visit here in February. She tells me that she thinks she has gained 15 of these pounds within the past 3 weeks.   She tells me that at some point in May she ended up seeing Dr. Jennye Boroughs with GI in [place] but this is only an office visit and no procedures  or studies were performed.   Current Medications, Allergies, Past Medical History, Past Surgical History, Family History and Social History were reviewed in [employer] record.   Physical Exam: BP 110/50 mmHg  Pulse 68  Ht  (1.626 m)  Wt 255 lb (115.667 kg)  BMI 43.75 kg/m2 General: Well developed white female in no acute distress Head: Normocephalic and atraumatic Eyes:  Sclerae anicteric, conjunctiva pink  Ears: Normal auditory acuity Lungs: Clear throughout to auscultation Heart: Regular rate and rhythm Abdomen: Soft, obese.  Likely some ascites present.  Normal bowel sounds.  Mild lower abdominal TTP. Rectal: Will be done at the time of colonoscopy. Musculoskeletal: Symmetrical with no gross deformities  Extremities: 2+ edema in B/L LE's Neurological: Alert oriented x  4, grossly non-focal Psychological:  Alert and cooperative. 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 Will check CBC, CMP, PT/INR, and AFP today since we do not have recent labs from PCP. We will schedule for EGD for variceal surveillance with Dr. Marina Goodell.  She will continue Lasix 40 mg twice daily.  Discontinue metolazone 5 mg and replace this with spironolactone 100 mg twice daily. She is having repeat labs done next week by her PCP so we will follow-up on those results as well since we will need to monitor her renal function.  Continue pantoprazole 40 mg daily.  ? Need for paracentesis at some point. -Screening colonoscopy:  Patient is requesting this since she will be having colonoscopy anyway.  Will schedule with Dr. Marina GoodellPerry as well -IDDM:  Insulin will be adjusted prior to endoscopic procedure per protocol. Will resume normal dosing after procedure. -CAD/carotid artery disease, on Plavix:  Hold Plavix for 5 days before procedure - will instruct when and how to resume  after procedure. Risks and benefits of procedure including bleeding, perforation, infection, missed lesions, medication reactions and possible hospitalization or surgery if complications occur explained. Additional rare but real risk of cardiovascular event such as heart attack or ischemia/infarct of other organs off of Plavix explained and need to seek urgent help if this occurs. Will communicate by phone or EMR with patient's prescribing provider, Dr. Allyson SabalBerry,  that to confirm holding Plavix is reasonable in this case.  -Multiple medical problems and medical noncompliance

## 2015-03-05 NOTE — Telephone Encounter (Signed)
-----   Message from Leta BaptistJessica D Zehr, PA-C sent at 03/05/2015  3:30 PM EDT ----- Can you please contact this patient and make sure that she had labs done this past week. She was going to have them done in [place] by her PCP since they had already ordered some. We need to get the records (ASAP because I adjusted her diuretics and need to make sure that her renal function is ok) of them if she had them performed and if not we need to order some.  Thank you,  Jess

## 2015-03-06 NOTE — Progress Notes (Signed)
Very complex patient with multiple problems as outlined and medical compliance issues. Medical management of ascites, and plans for endoscopic exams as outlined

## 2015-03-08 NOTE — Telephone Encounter (Signed)
Pt states she forgot and she will have it done tomorrow.

## 2015-03-08 NOTE — Telephone Encounter (Signed)
Left message for pt to call back  °

## 2015-03-20 NOTE — Telephone Encounter (Signed)
Left a message for patient to call back. 

## 2015-03-22 ENCOUNTER — Telehealth: Payer: Self-pay | Admitting: *Deleted

## 2015-03-22 NOTE — Telephone Encounter (Signed)
Left a message for patient to call back.(did she have labs at PCP?)

## 2015-03-23 NOTE — Telephone Encounter (Signed)
Left a message for patient to call and let us know if she had labs at PCP.

## 2015-03-27 NOTE — Telephone Encounter (Signed)
Spoke with patient and she is having labs drawn today by a home health nurse. PCP is Dr. Imelda PillowHawks [facility].

## 2015-03-27 NOTE — Telephone Encounter (Signed)
Spoke with Dr. Lendon ColonelHawks office and they will send her recent labs to us.

## 2015-03-27 NOTE — Telephone Encounter (Signed)
Received labs from Dr. Lendon ColonelHawks and given to Doug SouJessica Zehr, PA.

## 2015-03-28 ENCOUNTER — Ambulatory Visit: Payer: Medicare Other | Admitting: Internal Medicine

## 2015-03-30 ENCOUNTER — Telehealth: Payer: Self-pay | Admitting: *Deleted

## 2015-03-30 NOTE — Telephone Encounter (Signed)
Per Doug SouJessica Zehr, PA, the labs she received from Dr. Lendon ColonelHawks office are after patient's OV. Will call Dr. Lendon ColonelHawks office and ask if patient had labs last week like patient told me she was going to do.

## 2015-04-03 ENCOUNTER — Telehealth: Payer: Self-pay | Admitting: Cardiovascular Disease

## 2015-04-03 NOTE — Telephone Encounter (Signed)
Results on Doug SouJessica Zehr, [place]PA desk.

## 2015-04-03 NOTE — Telephone Encounter (Signed)
Office notes given to kim, dr berry's nurse, no mention on the note from august of medication change.

## 2015-04-03 NOTE — Telephone Encounter (Addendum)
Spoke with pt, she was recently seen by her PCP dr Lendon [place]hawks in [place]. She reports her metoprolol was stopped. She was given propranolol 60 mg bid and isosorbide 20 mg bid. Dr Marina Goodellperry her GI doctor has also given her aldactone 50 mg 2 tablets bid. She now states she is dehydrated and wants to know if she should go back to her previous meds. Her bp is running 118-130/50-70's. Requested most recent office note from dr Lendon Colonelhawks be faxed for review. Advised pt to continue current medications for now.

## 2015-04-03 NOTE — Telephone Encounter (Signed)
Want to know should she go back on the Metoprolol 50mg  and what 50mg  of lasik should she be taking .Marland Kitchen.Please call   Thanks

## 2015-04-03 NOTE — Telephone Encounter (Signed)
Left message for pt to call.

## 2015-04-04 ENCOUNTER — Telehealth: Payer: Self-pay | Admitting: *Deleted

## 2015-04-04 NOTE — Telephone Encounter (Signed)
May be related to her diuretic but in the absence of seeing her first hand I would wait for her to see Theodore DemarkRhonda Barrett in December to make him more informed decision

## 2015-04-04 NOTE — Telephone Encounter (Signed)
Left a message for patient to call back. 

## 2015-04-04 NOTE — Telephone Encounter (Signed)
Spoke with pt she stated she is feeling horrible and has been taking medication as prescribed by Dr. Marina GoodellPerry and Dr. Ardelle ParkHaque.  Pt stated that she spoke with Dr. Stefanie LibelHaque's office this morning and they decreased her Aldactone to 50 mg Qdaily from the 100 mg BID.  Pt stated she thinks this is just to muchmedication and that she is dehydrated.  Pt wants to know what Dr. Allyson SabalBerry things of her new medication changes.  Pt has appt with Theodore Demarkhonda Barrett, on 12/13 and was made aware that she can disucss this during that appt. She agreed but wants to know Dr. Hazle CocaBerry's opinion.  Pt also stated she has less swelling but does not feel it is r/t to medication as much as it is to her starting to wear compression stockings during the day.  Told pt our office would call her back once we hear from Dr. Allyson SabalBerry.  Pt verbalized understanding, no questions at this time.

## 2015-04-04 NOTE — Telephone Encounter (Signed)
Pt aware of Dr. Hazle CocaBerry's recommendations. Pt verbalized understanding no questions at this time.

## 2015-04-04 NOTE — Telephone Encounter (Signed)
Spoke with patient and she states her PCP has called her today and decreased her to Lasix 40 mg daily and Spironolactone 50 mg Daily until he sees her next Tuesday.

## 2015-04-04 NOTE — Telephone Encounter (Signed)
-----   Message from Leta BaptistJessica D Zehr, PA-C sent at 04/03/2015  4:59 PM EST ----- Can you please confirm what doses of lasix and spironolactone the patient is taking?  I had increased/adjusted them while she was here and her labs that she finally had performed for us show that her renal function has declined somewhat, which I was concerned may happen and that is why I wanted her to have the labs done a week after her visit and not 4 weeks after her visit.   Just for our documentation, Cr 1.70, BUN 52, and GFR 30 on 04/02/2015.

## 2015-04-20 ENCOUNTER — Ambulatory Visit: Payer: Medicare Other | Admitting: Physician Assistant

## 2015-04-25 ENCOUNTER — Encounter: Payer: Medicare Other | Admitting: Internal Medicine

## 2015-05-01 ENCOUNTER — Ambulatory Visit: Payer: Medicare Other | Admitting: Physician Assistant

## 2015-05-03 ENCOUNTER — Encounter: Payer: Medicare Other | Admitting: Internal Medicine

## 2015-05-22 ENCOUNTER — Ambulatory Visit: Payer: Medicare Other | Admitting: Physician Assistant

## 2015-06-25 ENCOUNTER — Telehealth: Payer: Self-pay | Admitting: Cardiovascular Disease

## 2015-06-25 ENCOUNTER — Encounter: Payer: Medicare Other | Admitting: Physician Assistant

## 2015-06-25 NOTE — Telephone Encounter (Signed)
New message     Pt has an appt today at 2pm with Bjorn Loser.  She does not know if she should cancel or keep her appt.  She has a cold with chills with no fever.  Talk to a nurse to see if she should cancel or keep appt

## 2015-06-25 NOTE — Telephone Encounter (Signed)
Pt on schedule for her 6 month extender checkup.  Pt home sick w/ viral illness. Advised to stay home & rest, if not improved see PCP.  Pt informs me she will call to reschedule appt when she feels improved. Today's appt cancelled.

## 2015-06-25 NOTE — Progress Notes (Signed)
    Cardiology Office Note   Date:  06/25/2015   ID:  [name], DOB 11-Mar-1951, MRN [id number]  PCP:  Galvin Proffer, MD  Cardiologist:  Dr William Hamburger, PA-C   No chief complaint on file.   History of Present Illness: Monica [name] is a 65 y.o. 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 EF 60-65% w/ grade 1 DD by echo 10/2014   This encounter was created in error - please disregard.

## 2015-08-24 ENCOUNTER — Other Ambulatory Visit: Payer: Self-pay | Admitting: Cardiovascular Disease

## 2015-08-24 NOTE — Telephone Encounter (Signed)
Rx(s) sent to pharmacy electronically.  

## 2015-09-28 ENCOUNTER — Telehealth: Payer: Self-pay | Admitting: *Deleted

## 2015-09-28 NOTE — Telephone Encounter (Signed)
Pt states she scheduled in [place] 10/01/2015 and needs to reschedule.

## 2015-10-01 ENCOUNTER — Ambulatory Visit: Payer: Medicare Other | Admitting: Podiatry

## 2015-11-22 ENCOUNTER — Other Ambulatory Visit: Payer: Self-pay | Admitting: Internal Medicine

## 2015-11-22 DIAGNOSIS — E041 Nontoxic single thyroid nodule: Secondary | ICD-10-CM

## 2015-11-23 ENCOUNTER — Other Ambulatory Visit: Payer: Self-pay | Admitting: Internal Medicine

## 2015-11-23 DIAGNOSIS — E041 Nontoxic single thyroid nodule: Secondary | ICD-10-CM

## 2015-11-26 ENCOUNTER — Other Ambulatory Visit: Payer: Medicare Other

## 2016-05-09 ENCOUNTER — Other Ambulatory Visit: Payer: Self-pay | Admitting: Internal Medicine

## 2016-05-09 DIAGNOSIS — E041 Nontoxic single thyroid nodule: Secondary | ICD-10-CM

## 2016-05-20 ENCOUNTER — Other Ambulatory Visit: Payer: Self-pay | Admitting: Internal Medicine

## 2016-05-26 ENCOUNTER — Other Ambulatory Visit: Payer: Medicare Other

## 2016-05-27 ENCOUNTER — Other Ambulatory Visit: Payer: Medicare Other

## 2016-05-30 ENCOUNTER — Ambulatory Visit
Admission: RE | Admit: 2016-05-30 | Discharge: 2016-05-30 | Disposition: A | Discharge status: Home / Self Care | Payer: Medicare Other | Source: Ambulatory Visit | Attending: Internal Medicine | Admitting: Internal Medicine

## 2016-05-30 DIAGNOSIS — E041 Nontoxic single thyroid nodule: Secondary | ICD-10-CM

## 2016-06-12 ENCOUNTER — Telehealth: Payer: Self-pay | Admitting: Cardiovascular Disease

## 2016-06-12 NOTE — Telephone Encounter (Signed)
New message       Request for surgical clearance:  What type of surgery is being performed?  Thyroid biopsy 1. When is this surgery scheduled?  Pending clearance  Are there any medications that need to be held prior to surgery and how long? Stop plavix 5 days prior?  Is this ok? 2. Name of physician performing surgery?  Not sure  What is your office phone and fax number?  Fax [phone]

## 2016-06-12 NOTE — Telephone Encounter (Signed)
Pt has not been seen in over year and a half.  Several attempts to schedule patient, with cancelled return visits last year.  Returned call to [facility], advised patient will probably need return visit to see Dr. Allyson SabalBerry or PA but that I would send inquiry for biopsy/plavix hold to provider.

## 2016-06-15 NOTE — Telephone Encounter (Signed)
Agreed -

## 2016-07-25 ENCOUNTER — Emergency Department (HOSPITAL_COMMUNITY): Payer: Medicare Other

## 2016-07-25 ENCOUNTER — Inpatient Hospital Stay (HOSPITAL_COMMUNITY)
Admission: EM | Admit: 2016-07-25 | Discharge: 2016-07-28 | DRG: 442 | Disposition: A | Discharge status: Home Health | Payer: Medicare Other | Attending: Internal Medicine | Admitting: Internal Medicine

## 2016-07-25 ENCOUNTER — Inpatient Hospital Stay (HOSPITAL_COMMUNITY): Payer: Medicare Other

## 2016-07-25 ENCOUNTER — Encounter (HOSPITAL_COMMUNITY): Payer: Self-pay | Admitting: *Deleted

## 2016-07-25 DIAGNOSIS — H5462 Unqualified visual loss, left eye, normal vision right eye: Secondary | ICD-10-CM | POA: Diagnosis present

## 2016-07-25 DIAGNOSIS — K7290 Hepatic failure, unspecified without coma: Secondary | ICD-10-CM | POA: Diagnosis not present

## 2016-07-25 DIAGNOSIS — I2510 Atherosclerotic heart disease of native coronary artery without angina pectoris: Secondary | ICD-10-CM | POA: Diagnosis present

## 2016-07-25 DIAGNOSIS — G4730 Sleep apnea, unspecified: Secondary | ICD-10-CM | POA: Diagnosis present

## 2016-07-25 DIAGNOSIS — I779 Disorder of arteries and arterioles, unspecified: Secondary | ICD-10-CM | POA: Diagnosis present

## 2016-07-25 DIAGNOSIS — Z9049 Acquired absence of other specified parts of digestive tract: Secondary | ICD-10-CM

## 2016-07-25 DIAGNOSIS — Z882 Allergy status to sulfonamides status: Secondary | ICD-10-CM

## 2016-07-25 DIAGNOSIS — Z720 Tobacco use: Secondary | ICD-10-CM | POA: Diagnosis present

## 2016-07-25 DIAGNOSIS — R791 Abnormal coagulation profile: Secondary | ICD-10-CM | POA: Diagnosis present

## 2016-07-25 DIAGNOSIS — R188 Other ascites: Secondary | ICD-10-CM | POA: Diagnosis present

## 2016-07-25 DIAGNOSIS — N179 Acute kidney failure, unspecified: Secondary | ICD-10-CM | POA: Diagnosis present

## 2016-07-25 DIAGNOSIS — F17210 Nicotine dependence, cigarettes, uncomplicated: Secondary | ICD-10-CM | POA: Diagnosis present

## 2016-07-25 DIAGNOSIS — Z66 Do not resuscitate: Secondary | ICD-10-CM | POA: Diagnosis present

## 2016-07-25 DIAGNOSIS — Z794 Long term (current) use of insulin: Secondary | ICD-10-CM

## 2016-07-25 DIAGNOSIS — I8510 Secondary esophageal varices without bleeding: Secondary | ICD-10-CM | POA: Diagnosis present

## 2016-07-25 DIAGNOSIS — E1151 Type 2 diabetes mellitus with diabetic peripheral angiopathy without gangrene: Secondary | ICD-10-CM | POA: Diagnosis present

## 2016-07-25 DIAGNOSIS — K7469 Other cirrhosis of liver: Secondary | ICD-10-CM | POA: Diagnosis present

## 2016-07-25 DIAGNOSIS — Z79899 Other long term (current) drug therapy: Secondary | ICD-10-CM

## 2016-07-25 DIAGNOSIS — Z91040 Latex allergy status: Secondary | ICD-10-CM

## 2016-07-25 DIAGNOSIS — D509 Iron deficiency anemia, unspecified: Secondary | ICD-10-CM | POA: Diagnosis present

## 2016-07-25 DIAGNOSIS — I10 Essential (primary) hypertension: Secondary | ICD-10-CM | POA: Diagnosis present

## 2016-07-25 DIAGNOSIS — K449 Diaphragmatic hernia without obstruction or gangrene: Secondary | ICD-10-CM | POA: Diagnosis present

## 2016-07-25 DIAGNOSIS — K219 Gastro-esophageal reflux disease without esophagitis: Secondary | ICD-10-CM | POA: Diagnosis present

## 2016-07-25 DIAGNOSIS — K589 Irritable bowel syndrome without diarrhea: Secondary | ICD-10-CM | POA: Diagnosis present

## 2016-07-25 DIAGNOSIS — K7581 Nonalcoholic steatohepatitis (NASH): Secondary | ICD-10-CM | POA: Diagnosis present

## 2016-07-25 DIAGNOSIS — E669 Obesity, unspecified: Secondary | ICD-10-CM | POA: Diagnosis present

## 2016-07-25 DIAGNOSIS — E785 Hyperlipidemia, unspecified: Secondary | ICD-10-CM | POA: Diagnosis present

## 2016-07-25 DIAGNOSIS — E039 Hypothyroidism, unspecified: Secondary | ICD-10-CM | POA: Diagnosis present

## 2016-07-25 DIAGNOSIS — Z881 Allergy status to other antibiotic agents status: Secondary | ICD-10-CM

## 2016-07-25 DIAGNOSIS — I739 Peripheral vascular disease, unspecified: Secondary | ICD-10-CM

## 2016-07-25 DIAGNOSIS — H3530 Unspecified macular degeneration: Secondary | ICD-10-CM | POA: Diagnosis present

## 2016-07-25 DIAGNOSIS — Z7902 Long term (current) use of antithrombotics/antiplatelets: Secondary | ICD-10-CM

## 2016-07-25 DIAGNOSIS — Z883 Allergy status to other anti-infective agents status: Secondary | ICD-10-CM

## 2016-07-25 DIAGNOSIS — Z888 Allergy status to other drugs, medicaments and biological substances status: Secondary | ICD-10-CM

## 2016-07-25 DIAGNOSIS — Z9119 Patient's noncompliance with other medical treatment and regimen: Secondary | ICD-10-CM

## 2016-07-25 DIAGNOSIS — K7682 Hepatic encephalopathy: Secondary | ICD-10-CM | POA: Diagnosis present

## 2016-07-25 DIAGNOSIS — Z880 Allergy status to penicillin: Secondary | ICD-10-CM

## 2016-07-25 DIAGNOSIS — E860 Dehydration: Secondary | ICD-10-CM | POA: Diagnosis present

## 2016-07-25 DIAGNOSIS — R600 Localized edema: Secondary | ICD-10-CM

## 2016-07-25 DIAGNOSIS — I8500 Esophageal varices without bleeding: Secondary | ICD-10-CM

## 2016-07-25 DIAGNOSIS — Z833 Family history of diabetes mellitus: Secondary | ICD-10-CM

## 2016-07-25 LAB — AMMONIA: AMMONIA: 114 umol/L — AB (ref 9–35)

## 2016-07-25 LAB — I-STAT CHEM 8, ED
BUN: 39 mg/dL — ABNORMAL HIGH (ref 6–20)
Calcium, Ion: 0.99 mmol/L — ABNORMAL LOW (ref 1.15–1.40)
Chloride: 108 mmol/L (ref 101–111)
Creatinine, Ser: 2.1 mg/dL — ABNORMAL HIGH (ref 0.44–1.00)
Glucose, Bld: 130 mg/dL — ABNORMAL HIGH (ref 65–99)
HEMATOCRIT: 31 % — AB (ref 36.0–46.0)
HEMOGLOBIN: 10.5 g/dL — AB (ref 12.0–15.0)
Potassium: 4.3 mmol/L (ref 3.5–5.1)
SODIUM: 141 mmol/L (ref 135–145)
TCO2: 23 mmol/L (ref 0–100)

## 2016-07-25 LAB — URINALYSIS, ROUTINE W REFLEX MICROSCOPIC
BACTERIA UA: NONE SEEN
Glucose, UA: NEGATIVE mg/dL
HGB URINE DIPSTICK: NEGATIVE
KETONES UR: NEGATIVE mg/dL
LEUKOCYTES UA: NEGATIVE
NITRITE: NEGATIVE
Protein, ur: 30 mg/dL — AB
Specific Gravity, Urine: 1.019 (ref 1.005–1.030)
pH: 5 (ref 5.0–8.0)

## 2016-07-25 LAB — COMPREHENSIVE METABOLIC PANEL
ALBUMIN: 2.1 g/dL — AB (ref 3.5–5.0)
ALT: 17 U/L (ref 14–54)
ANION GAP: 5 (ref 5–15)
AST: 26 U/L (ref 15–41)
Alkaline Phosphatase: 101 U/L (ref 38–126)
BILIRUBIN TOTAL: 0.9 mg/dL (ref 0.3–1.2)
BUN: 41 mg/dL — ABNORMAL HIGH (ref 6–20)
CO2: 22 mmol/L (ref 22–32)
Calcium: 7.1 mg/dL — ABNORMAL LOW (ref 8.9–10.3)
Chloride: 112 mmol/L — ABNORMAL HIGH (ref 101–111)
Creatinine, Ser: 2.1 mg/dL — ABNORMAL HIGH (ref 0.44–1.00)
GFR calc non Af Amer: 23 mL/min — ABNORMAL LOW (ref >60)
GFR, EST AFRICAN AMERICAN: 27 mL/min — AB (ref >60)
GLUCOSE: 141 mg/dL — AB (ref 65–99)
POTASSIUM: 4.4 mmol/L (ref 3.5–5.1)
Sodium: 139 mmol/L (ref 135–145)
TOTAL PROTEIN: 4.5 g/dL — AB (ref 6.5–8.1)

## 2016-07-25 LAB — CREATININE, SERUM
Creatinine, Ser: 2.09 mg/dL — ABNORMAL HIGH (ref 0.44–1.00)
GFR calc Af Amer: 27 mL/min — ABNORMAL LOW (ref >60)
GFR, EST NON AFRICAN AMERICAN: 24 mL/min — AB (ref >60)

## 2016-07-25 LAB — PROTIME-INR
INR: 1.71
PROTHROMBIN TIME: 20.3 s — AB (ref 11.4–15.2)

## 2016-07-25 LAB — CBC
HCT: 31.2 % — ABNORMAL LOW (ref 36.0–46.0)
HEMATOCRIT: 33.4 % — AB (ref 36.0–46.0)
HEMOGLOBIN: 9.9 g/dL — AB (ref 12.0–15.0)
HEMOGLOBIN: 10.7 g/dL — AB (ref 12.0–15.0)
MCH: 27 pg (ref 26.0–34.0)
MCH: 27.4 pg (ref 26.0–34.0)
MCHC: 31.7 g/dL (ref 30.0–36.0)
MCHC: 32 g/dL (ref 30.0–36.0)
MCV: 85 fL (ref 78.0–100.0)
MCV: 85.6 fL (ref 78.0–100.0)
PLATELETS: 93 10*3/uL — AB (ref 150–400)
Platelets: 97 10*3/uL — ABNORMAL LOW (ref 150–400)
RBC: 3.67 MIL/uL — ABNORMAL LOW (ref 3.87–5.11)
RBC: 3.9 MIL/uL (ref 3.87–5.11)
RDW: 16.4 % — ABNORMAL HIGH (ref 11.5–15.5)
RDW: 16.5 % — ABNORMAL HIGH (ref 11.5–15.5)
WBC: 7.7 10*3/uL (ref 4.0–10.5)
WBC: 8.2 10*3/uL (ref 4.0–10.5)

## 2016-07-25 LAB — DIFFERENTIAL
Basophils Absolute: 0 10*3/uL (ref 0.0–0.1)
Basophils Relative: 0 %
EOS ABS: 0.1 10*3/uL (ref 0.0–0.7)
EOS PCT: 1 %
LYMPHS ABS: 1.5 10*3/uL (ref 0.7–4.0)
Lymphocytes Relative: 20 %
MONO ABS: 0.7 10*3/uL (ref 0.1–1.0)
Monocytes Relative: 10 %
NEUTROS PCT: 69 %
Neutro Abs: 5.3 10*3/uL (ref 1.7–7.7)

## 2016-07-25 LAB — CBG MONITORING, ED
GLUCOSE-CAPILLARY: 53 mg/dL — AB (ref 65–99)
Glucose-Capillary: 117 mg/dL — ABNORMAL HIGH (ref 65–99)
Glucose-Capillary: 88 mg/dL (ref 65–99)

## 2016-07-25 LAB — RAPID URINE DRUG SCREEN, HOSP PERFORMED
Amphetamines: NOT DETECTED
Barbiturates: NOT DETECTED
Benzodiazepines: POSITIVE — AB
COCAINE: NOT DETECTED
OPIATES: NOT DETECTED
Tetrahydrocannabinol: NOT DETECTED

## 2016-07-25 LAB — OSMOLALITY: OSMOLALITY: 305 mosm/kg — AB (ref 275–295)

## 2016-07-25 LAB — ETHANOL: Alcohol, Ethyl (B): <5 mg/dL (ref <5)

## 2016-07-25 LAB — I-STAT ARTERIAL BLOOD GAS, ED
Acid-base deficit: 4 mmol/L — ABNORMAL HIGH (ref 0.0–2.0)
Bicarbonate: 21.6 mmol/L (ref 20.0–28.0)
O2 SAT: 92 %
TCO2: 23 mmol/L (ref 0–100)
pCO2 arterial: 41.9 mmHg (ref 32.0–48.0)
pH, Arterial: 7.32 — ABNORMAL LOW (ref 7.350–7.450)
pO2, Arterial: 70 mmHg — ABNORMAL LOW (ref 83.0–108.0)

## 2016-07-25 LAB — TSH: TSH: 2.61 u[IU]/mL (ref 0.350–4.500)

## 2016-07-25 LAB — I-STAT TROPONIN, ED: TROPONIN I, POC: 0 ng/mL (ref 0.00–0.08)

## 2016-07-25 LAB — URIC ACID: URIC ACID, SERUM: 5.1 mg/dL (ref 2.3–6.6)

## 2016-07-25 LAB — CK: Total CK: 54 U/L (ref 38–234)

## 2016-07-25 LAB — APTT: aPTT: 31 seconds (ref 24–36)

## 2016-07-25 LAB — GLUCOSE, CAPILLARY: GLUCOSE-CAPILLARY: 91 mg/dL (ref 65–99)

## 2016-07-25 MED ORDER — ONDANSETRON HCL 4 MG/2ML IJ SOLN: 4.0000 mg | Freq: Four times a day (QID) | INTRAMUSCULAR | Status: DC | PRN

## 2016-07-25 MED ORDER — ONDANSETRON HCL 4 MG PO TABS: 4.0000 mg | ORAL_TABLET | Freq: Four times a day (QID) | ORAL | Status: DC | PRN

## 2016-07-25 MED ORDER — INSULIN GLARGINE 100 UNIT/ML ~~LOC~~ SOLN
24.0000 [IU] | Freq: Every day | SUBCUTANEOUS | Status: DC
  Filled 2016-07-25: qty 0.24

## 2016-07-25 MED ORDER — INSULIN GLARGINE 100 UNIT/ML ~~LOC~~ SOLN: 24.0000 [IU] | Freq: Every day | SUBCUTANEOUS | Status: DC

## 2016-07-25 MED ORDER — PROPRANOLOL HCL 60 MG PO TABS
60.0000 mg | ORAL_TABLET | Freq: Every day | ORAL | Status: DC
  Administered 2016-07-26 – 2016-07-27 (×2): 60 mg via ORAL
  Filled 2016-07-25 (×3): qty 1

## 2016-07-25 MED ORDER — INSULIN ASPART 100 UNIT/ML ~~LOC~~ SOLN: 0.0000 [IU] | SUBCUTANEOUS | Status: DC

## 2016-07-25 MED ORDER — LORAZEPAM 2 MG/ML IJ SOLN
1.0000 mg | Freq: Once | INTRAMUSCULAR | Status: AC | PRN
  Administered 2016-07-25: 1 mg via INTRAVENOUS
  Filled 2016-07-25: qty 1

## 2016-07-25 MED ORDER — SODIUM CHLORIDE 0.9 % IV BOLUS (SEPSIS)
1000.0000 mL | Freq: Once | INTRAVENOUS | Status: AC
  Administered 2016-07-25: 1000 mL via INTRAVENOUS

## 2016-07-25 MED ORDER — DEXTROSE 50 % IV SOLN
50.0000 mL | Freq: Once | INTRAVENOUS | Status: AC
  Administered 2016-07-25: 50 mL via INTRAVENOUS
  Filled 2016-07-25: qty 50

## 2016-07-25 MED ORDER — ALPRAZOLAM 0.5 MG PO TABS
0.5000 mg | ORAL_TABLET | Freq: Every day | ORAL | Status: DC
  Administered 2016-07-26 – 2016-07-27 (×2): 0.5 mg via ORAL
  Filled 2016-07-25 (×3): qty 1

## 2016-07-25 MED ORDER — ALBUTEROL SULFATE (2.5 MG/3ML) 0.083% IN NEBU: 2.5000 mg | INHALATION_SOLUTION | RESPIRATORY_TRACT | Status: DC | PRN

## 2016-07-25 MED ORDER — RIFAXIMIN 550 MG PO TABS
550.0000 mg | ORAL_TABLET | Freq: Two times a day (BID) | ORAL | Status: DC
  Administered 2016-07-26 – 2016-07-27 (×4): 550 mg via ORAL
  Filled 2016-07-25 (×6): qty 1

## 2016-07-25 MED ORDER — SODIUM CHLORIDE 0.9% FLUSH
3.0000 mL | Freq: Two times a day (BID) | INTRAVENOUS | Status: DC
  Administered 2016-07-25 – 2016-07-27 (×2): 3 mL via INTRAVENOUS

## 2016-07-25 MED ORDER — LACTULOSE ENEMA
300.0000 mL | Freq: Once | RECTAL | Status: AC
Start: 2016-07-25 — End: 2016-07-25
  Administered 2016-07-25: 300 mL via RECTAL
  Filled 2016-07-25: qty 300

## 2016-07-25 MED ORDER — LACTULOSE ENEMA
300.0000 mL | Freq: Two times a day (BID) | ORAL | Status: DC
  Administered 2016-07-25: 300 mL via RECTAL
  Filled 2016-07-25 (×2): qty 300

## 2016-07-25 MED ORDER — SODIUM CHLORIDE 0.9 % IV SOLN
INTRAVENOUS | Status: DC
  Administered 2016-07-25: 22:00:00 via INTRAVENOUS
  Administered 2016-07-26: 1000 mL via INTRAVENOUS

## 2016-07-25 MED ORDER — CLOPIDOGREL BISULFATE 75 MG PO TABS
75.0000 mg | ORAL_TABLET | Freq: Every day | ORAL | Status: DC
  Administered 2016-07-26 – 2016-07-27 (×2): 75 mg via ORAL
  Filled 2016-07-25 (×3): qty 1

## 2016-07-25 MED ORDER — LEVOTHYROXINE SODIUM 75 MCG PO TABS
75.0000 ug | ORAL_TABLET | Freq: Every day | ORAL | Status: DC
  Administered 2016-07-26 – 2016-07-28 (×3): 75 ug via ORAL
  Filled 2016-07-25 (×4): qty 1

## 2016-07-25 MED ORDER — HEPARIN SODIUM (PORCINE) 5000 UNIT/ML IJ SOLN
5000.0000 [IU] | Freq: Three times a day (TID) | INTRAMUSCULAR | Status: DC
  Administered 2016-07-25 – 2016-07-28 (×7): 5000 [IU] via SUBCUTANEOUS
  Filled 2016-07-25 (×8): qty 1

## 2016-07-25 NOTE — H&P (Signed)
TRH H&P   Patient Demographics:    Monica [name], is a 66 y.o. female  MRN: [id number]   DOB - [birth year]  Admit Date - 07/25/2016  Outpatient Primary MD for the patient is HAGUE, Myrene Galas, MD      Patient coming from: Home  Chief Complaint  Patient presents with  . Altered Mental Status      HPI:    Monica [name]  is a 66 y.o. 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  In the ER patient found to be profoundly encephalopathic with an elevated ammonia level, head CT was unremarkable, patient unable to provide review of systems history provided by husband that site. I was called to admit the patient.    Review of systems:    Unable to answer questions due to encephalopathy  With Past History of the following :    Past Medical History:  Diagnosis Date  . Bilateral lower extremity edema   . Carotid artery bruit    Doppler 07/09/2011 - abnormal study, no change from previous  . Chest pain 09/02/2012   Normal, EF-67  . Coronary atherosclerosis of unspecified type of vessel, native or graft   . Endometrial polyp   . Esophageal varices (HCC)   . GERD (gastroesophageal reflux disease)   . Hiatal hernia   . Hyperlipidemia   . Hypertension   . Iron deficiency anemia   . Irritable bowel syndrome   . Macular degeneration (senile) of retina,  unspecified   . Obesity, unspecified   . Osteoarthrosis, unspecified whether generalized or localized, unspecified site   . 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 SOB (shortness of breath)    Normal 2D echo - EF-58, 03/18/2001  . Tobacco abuse   . Type II or unspecified type diabetes mellitus without mention of complication, not stated as uncontrolled   . Unspecified sleep apnea       Past Surgical History:  Procedure Laterality Date  .  APPENDECTOMY    . CARDIAC CATHETERIZATION  03/18/2007   Normal coronary arteries and LV function  . CARDIAC CATHETERIZATION  07/01/2001   Normal cath  . CARDIAC CATHETERIZATION  05/17/1997   Normal cath  . CAROTID ENDARTERECTOMY    . CHOLECYSTECTOMY        Social History:     Social History  Substance Use Topics  . Smoking status: Current Every Day Smoker    Packs/day: 1.00    Types: Cigarettes  . Smokeless tobacco: Never Used     Comment: form given on 05-26-11  . Alcohol use No         Family History :     Family History  Problem Relation Age of Onset  . Breast cancer Mother   . Diabetes Father   . Breast cancer Sister   . Diabetes Sister   . Diabetes Brother   . Colon cancer Paternal Aunt   . Stomach cancer Paternal Aunt        Home Medications:   Prior to Admission medications   Medication Sig Start Date End Date Taking? Authorizing Provider  ALPRAZolam Prudy Feeler) 1 MG tablet Take 1 mg by mouth 2 (two) times daily as needed.    Yes Historical Provider, MD  clopidogrel (PLAVIX) 75 MG tablet Take 75 mg by mouth daily.     Yes Historical Provider, MD  furosemide (LASIX) 40 MG tablet Take 1 tablet (40 mg total) by mouth 2 (two) times daily. <PLEASE MAKE APPOINTMENT FOR REFILLS> 08/24/15  Yes Runell Gess, MD  gabapentin (NEURONTIN) 300 MG capsule Take 300 mg by mouth 3 (three) times daily.  02/20/15  Yes Historical Provider, MD  isosorbide mononitrate  (ISMO,MONOKET) 20 MG tablet Take 20 mg by mouth daily.  02/20/15  Yes Historical Provider, MD  LANTUS SOLOSTAR 100 UNIT/ML Solostar Pen INJECT 20 TO 24 UNITS TWICE DAILY 06/21/14  Yes Reather Littler, MD  levothyroxine (SYNTHROID, LEVOTHROID) 75 MCG tablet Take 75 mcg by mouth daily. Brand Name Only   Yes Historical Provider, MD  NOVOLOG FLEXPEN 100 UNIT/ML FlexPen INJECT 20 UNITS INTO THE SKIN 3 TIMES DAILY WITH MEALS 06/21/14  Yes Reather Littler, MD  pantoprazole (PROTONIX) 40 MG tablet Take 40 mg by mouth daily.  07/18/14  Yes Historical Provider, MD  promethazine (PHENERGAN) 25 MG tablet Take 25 mg by mouth every 6 (six) hours as needed for nausea.    Yes Historical Provider, MD  propranolol (INDERAL) 60 MG tablet Take 60 mg by mouth daily.  02/20/15  Yes Historical Provider, MD  simvastatin (ZOCOR) 40 MG tablet Take 40 mg by mouth. Three times weekly   Yes Historical Provider, MD  spironolactone (ALDACTONE) 100 MG tablet Take 1 tablet (100 mg total) by mouth 2 (two) times daily. 02/22/15  Yes Jessica D Zehr, PA-C  VENTOLIN HFA 108 (90 BASE) MCG/ACT inhaler Inhale 2 puffs into the lungs every 6 (six) hours as needed for shortness of breath.  02/08/15  Yes Historical Provider, MD     Allergies:     Allergies  Allergen Reactions  . Augmentin [Amoxicillin-Pot Clavulanate]     Unknown   . Biaxin [Clarithromycin]     Unknown  . Invokana [Canagliflozin]     Unknown  . Latex     Unknown  . Sulfonamide Derivatives   . Penicillins Rash    Has patient had a PCN reaction causing immediate rash, facial/tongue/throat swelling, SOB or lightheadedness with hypotension:YES Has patient had a PCN reaction causing severe  rash involving mucus membranes or skin necrosis: NO Has patient had a PCN reaction that required hospitalizationNO Has patient had a PCN reaction occurring within the last 10 years: NO If all of the above answers are "NO", then may proceed with Cephalosporin use.     Physical Exam:    Vitals  Blood pressure (!) 113/51, pulse 70, temperature 98.7 F (37.1 C), temperature source Rectal, resp. rate 21, SpO2 98 %.   1. General Middle-aged obese white female lying in hospital bed confused and encephalopathic, unable to answer questions or follow commands, blind in left eye with left eye closed.  2. Moves all 4 extremity to painful stimuli left more than right.  3.  Plantars down going.  4. Ears and Eyes appear Normal, Conjunctivae clear, blind in left eye with left eye closed. Moist Oral Mucosa.  5. Supple Neck, No JVD, No cervical lymphadenopathy appriciated, No Carotid Bruits.  6. Symmetrical Chest wall movement, Good air movement bilaterally, CTAB.  7. RRR, No Gallops, Rubs or Murmurs, No Parasternal Heave.  8. Positive Bowel Sounds, Abdomen Soft with some ascites, No tenderness, No organomegaly appriciated,No rebound -guarding or rigidity.  9.  No Cyanosis, Normal Skin Turgor, No Skin Rash or Bruise.  10. Good muscle tone,  joints appear normal , no effusions, Normal ROM.  11. No Palpable Lymph Nodes in Neck or Axillae      Data Review:    CBC  Recent Labs Lab 07/25/16 1346 07/25/16 1409  WBC 7.7  --   HGB 9.9* 10.5*  HCT 31.2* 31.0*  PLT 93*  --   MCV 85.0  --   MCH 27.0  --   MCHC 31.7  --   RDW 16.4*  --   LYMPHSABS 1.5  --   MONOABS 0.7  --   EOSABS 0.1  --   BASOSABS 0.0  --    ------------------------------------------------------------------------------------------------------------------  Chemistries   Recent Labs Lab 07/25/16 1346 07/25/16 1409  NA 139 141  K 4.4 4.3  CL 112* 108  CO2 22  --   GLUCOSE 141* 130*  BUN 41* 39*  CREATININE 2.10* 2.10*  CALCIUM 7.1*  --   AST 26  --   ALT 17  --   ALKPHOS 101  --   BILITOT 0.9  --    ------------------------------------------------------------------------------------------------------------------ CrCl cannot be calculated (Unknown ideal  weight.). ------------------------------------------------------------------------------------------------------------------ No results for input(s): TSH, T4TOTAL, T3FREE, THYROIDAB in the last 72 hours.  Invalid input(s): FREET3  Coagulation profile  Recent Labs Lab 07/25/16 1346  INR 1.71   ------------------------------------------------------------------------------------------------------------------- No results for input(s): DDIMER in the last 72 hours. -------------------------------------------------------------------------------------------------------------------  Cardiac Enzymes No results for input(s): CKMB, TROPONINI, MYOGLOBIN in the last 168 hours.  Invalid input(s): CK ------------------------------------------------------------------------------------------------------------------ No results found for: BNP   ---------------------------------------------------------------------------------------------------------------  Urinalysis    Component Value Date/Time   COLORURINE AMBER (A) 07/25/2016 1328   APPEARANCEUR HAZY (A) 07/25/2016 1328   LABSPEC 1.019 07/25/2016 1328   PHURINE 5.0 07/25/2016 1328   GLUCOSEU NEGATIVE 07/25/2016 1328   GLUCOSEU NEGATIVE 08/31/2013 1516   HGBUR NEGATIVE 07/25/2016 1328   BILIRUBINUR SMALL (A) 07/25/2016 1328   KETONESUR NEGATIVE 07/25/2016 1328   PROTEINUR 30 (A) 07/25/2016 1328   UROBILINOGEN 0.2 08/31/2013 1516   NITRITE NEGATIVE 07/25/2016 1328   LEUKOCYTESUR NEGATIVE 07/25/2016 1328    ----------------------------------------------------------------------------------------------------------------   Imaging Results:    Ct Head Wo Contrast  Result Date: 07/25/2016 CLINICAL DATA:  Altered mental status. EXAM: CT HEAD WITHOUT CONTRAST TECHNIQUE:  Contiguous axial images were obtained from the base of the skull through the vertex without intravenous contrast. COMPARISON:  Head CT scan 05/05/2016. FINDINGS: Brain: Appears  normal without hemorrhage, infarct, mass lesion, mass effect, midline shift or abnormal extra-axial fluid collection. No hydrocephalus or pneumocephalus. Vascular: Atherosclerosis noted. Skull: Intact. Sinuses/Orbits: Negative. Other: None. IMPRESSION: No acute abnormality. Atherosclerosis. Electronically Signed   By: Drusilla Kanner M.D.   On: 07/25/2016 14:45   Dg Pelvis Portable  Result Date: 07/25/2016 CLINICAL DATA:  Soft tissue breakdown EXAM: PORTABLE PELVIS 1-2 VIEWS COMPARISON:  03/16/2015 FINDINGS: No pelvic fracture is identified. Scattered large and small bowel gas is seen. A relative mottled appearance is overlying soft tissues is noted which may be related to the known skin breakdown. IMPRESSION: No acute bony abnormality is noted. Electronically Signed   By: Alcide Clever M.D.   On: 07/25/2016 13:49   Dg Chest Portable 1 View  Result Date: 07/25/2016 CLINICAL DATA:  Altered mental status EXAM: PORTABLE CHEST 1 VIEW COMPARISON:  07/22/2007 FINDINGS: Resolved atelectasis or pleural fluid at the left base. There is no edema, consolidation, effusion, or pneumothorax. Normal heart size for technique. Artifact from EKG leads overlapping the chest. IMPRESSION: Interval clearing of left basilar atelectasis. No evidence of active disease. Electronically Signed   By: Marnee Spring M.D.   On: 07/25/2016 13:48       Assessment & Plan:      1. Severe hepatic encephalopathy. Admit, lactulose enema, nothing by mouth except medications, add Xifaxan, repeat ammonia level in the morning. Head CT is unremarkable however she is moving her left side more than her right Will check MRI brain as well. Supportive care for now.  2. ARF. Intravascular dehydration, does have third spacing of fluid due to her ascites, for now gently hydrate, he obtain urine electrolytes, repeat CMP in the morning.  3. [name] with cirrhosis and esophageal varices. Supportive care and continue beta blocker, INR stable will  monitor.  4. Dyslipidemia. Continue statin.  5. Hypertension. Continue beta blocker with caution and monitor.  6. GERD. On PPI.  7. Carotid artery disease and PAD. Continue Plavix and statin for secondary prevention.  8. DM type II. Since oral intake is poor Will continue Lantus at half home dose along with every 4 hours sliding scale.  CBG (last 3)   Recent Labs  07/25/16 1325 07/25/16 1410 07/25/16 1523  GLUCAP 53* 117* 88      DVT Prophylaxis Heparin    AM Labs Ordered, also please review Full Orders  Family Communication: Admission, patients condition and plan of care including tests being ordered have been discussed with the patient and husband who indicate understanding and agree with the plan and Code Status.  Code Status DNR  Likely DC to  TBD  Condition GUARDED     Consults called: None    Admission status: Inpt    Time spent in minutes : 35   SINGH,PRASHANT K M.D on 07/25/2016 at 4:27 PM  Between 7am to 7pm - Pager - [phone]. After 7pm go to [email or web address] - password [facility]  [facility] - Office  [phone]

## 2016-07-25 NOTE — ED Notes (Signed)
Patient transported to MRI 

## 2016-07-25 NOTE — ED Provider Notes (Signed)
[facility]-EMERGENCY DEPT Provider Note   CSN: [id number] Arrival date & time: 07/25/16  1312  LEVEL 4 CAVEAT - ALTERED MENTAL STATUS   History   Chief Complaint Chief Complaint  Patient presents with  . Altered Mental Status    HPI Monica [name] is a 66 y.o. female.  HPI  66 year old female with a history of cirrhosis, esophageal varices, presents with altered mental status. History is limited as the patient is unresponsive, no family is present. EMS presents the history. Was seen last night and was talkative. However the husband will EMS she has not been "normal" since last month. However since this morning she has been unresponsive and laying around. EMS found her laying on her left side. She has had a left-sided gaze. They were seeing left-sided flaccidity. Glucose was 76. Vital signs were unremarkable besides an O2 sat of 92%. No known code status, although husband told EMS she "would not want to be a vegetable".  Past Medical History:  Diagnosis Date  . Bilateral lower extremity edema   . Carotid artery bruit    Doppler 07/09/2011 - abnormal study, no change from previous  . Chest pain 09/02/2012   Normal, EF-67  . Coronary atherosclerosis of unspecified type of vessel, native or graft   . Endometrial polyp   . Esophageal varices (HCC)   . GERD (gastroesophageal reflux disease)   . Hiatal hernia   . Hyperlipidemia   . Hypertension   . Iron deficiency anemia   . Irritable bowel syndrome   . Macular degeneration (senile) of retina, unspecified   . Obesity, unspecified   . Osteoarthrosis, unspecified whether generalized or localized, unspecified site   . 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 SOB (shortness of breath)    Normal 2D echo - EF-58, 03/18/2001  . Tobacco abuse   . Type II or unspecified type diabetes mellitus without mention of complication, not stated as uncontrolled   . Unspecified sleep apnea      Patient Active Problem List   Diagnosis Date Noted  . Hepatic encephalopathy (HCC) 07/25/2016  . Esophageal varices without bleeding (HCC) 03/05/2015  . Leg edema 03/05/2015  . Ascites 03/05/2015  . Special screening for malignant neoplasms, colon 03/05/2015  . Hepatic cirrhosis (HCC) 03/05/2015  . Bilateral carotid artery disease (HCC) 08/15/2014  . Bilateral lower extremity edema 08/15/2014  . Tobacco abuse 08/15/2014  . Cirrhosis of liver without mention of alcohol 03/23/2013  . Other and unspecified hyperlipidemia 02/26/2013  . Polyneuropathy in diabetes(357.2) 02/26/2013  . Hypothyroidism 02/24/2013  . COUGH 10/07/2007  . Type II or unspecified type diabetes mellitus with neurological manifestations, uncontrolled(250.62) 04/08/2007  . Obesity 04/08/2007  . Macular degeneration (senile) of retina 04/08/2007  . Coronary atherosclerosis 04/08/2007  . GERD 04/08/2007  . IRRITABLE BOWEL SYNDROME 04/08/2007  . OSTEOARTHRITIS 04/08/2007  . SLEEP APNEA 04/08/2007    Past Surgical History:  Procedure Laterality Date  . APPENDECTOMY    . CARDIAC CATHETERIZATION  03/18/2007   Normal coronary arteries and LV function  . CARDIAC CATHETERIZATION  07/01/2001   Normal cath  . CARDIAC CATHETERIZATION  05/17/1997   Normal cath  . CAROTID ENDARTERECTOMY    . CHOLECYSTECTOMY      OB History    No data available       Home Medications    Prior to Admission medications   Medication Sig Start Date End Date Taking? Authorizing Provider  ALPRAZolam (XANAX) 1 MG tablet Take 1 mg by mouth 2 (two) times daily as needed.    Yes Historical Provider, MD  clopidogrel (PLAVIX) 75 MG tablet Take 75 mg by mouth daily.     Yes Historical Provider, MD  furosemide (LASIX) 40 MG tablet Take 1 tablet (40 mg total) by mouth 2 (two) times daily. <PLEASE MAKE APPOINTMENT FOR REFILLS> 08/24/15  Yes Runell Gess, MD  gabapentin (NEURONTIN) 300 MG capsule Take 300 mg by mouth 3 (three) times  daily.  02/20/15  Yes Historical Provider, MD  isosorbide mononitrate (ISMO,MONOKET) 20 MG tablet Take 20 mg by mouth daily.  02/20/15  Yes Historical Provider, MD  LANTUS SOLOSTAR 100 UNIT/ML Solostar Pen INJECT 20 TO 24 UNITS TWICE DAILY 06/21/14  Yes Reather Littler, MD  levothyroxine (SYNTHROID, LEVOTHROID) 75 MCG tablet Take 75 mcg by mouth daily. Brand Name Only   Yes Historical Provider, MD  NOVOLOG FLEXPEN 100 UNIT/ML FlexPen INJECT 20 UNITS INTO THE SKIN 3 TIMES DAILY WITH MEALS 06/21/14  Yes Reather Littler, MD  pantoprazole (PROTONIX) 40 MG tablet Take 40 mg by mouth daily.  07/18/14  Yes Historical Provider, MD  promethazine (PHENERGAN) 25 MG tablet Take 25 mg by mouth every 6 (six) hours as needed for nausea.    Yes Historical Provider, MD  propranolol (INDERAL) 60 MG tablet Take 60 mg by mouth daily.  02/20/15  Yes Historical Provider, MD  simvastatin (ZOCOR) 40 MG tablet Take 40 mg by mouth. Three times weekly   Yes Historical Provider, MD  spironolactone (ALDACTONE) 100 MG tablet Take 1 tablet (100 mg total) by mouth 2 (two) times daily. 02/22/15  Yes Jessica D Zehr, PA-C  VENTOLIN HFA 108 (90 BASE) MCG/ACT inhaler Inhale 2 puffs into the lungs every 6 (six) hours as needed for shortness of breath.  02/08/15  Yes Historical Provider, MD    Family History Family History  Problem Relation Age of Onset  . Breast cancer Mother   . Diabetes Father   . Breast cancer Sister   . Diabetes Sister   . Diabetes Brother   . Colon cancer Paternal Aunt   . Stomach cancer Paternal Aunt     Social History Social History  Substance Use Topics  . Smoking status: Current Every Day Smoker    Packs/day: 1.00    Types: Cigarettes  . Smokeless tobacco: Never Used     Comment: form given on 05-26-11  . Alcohol use No     Allergies   Augmentin [amoxicillin-pot clavulanate]; Biaxin [clarithromycin]; Invokana [canagliflozin]; Latex; Sulfonamide derivatives; and Penicillins   Review of Systems Review of  Systems  Unable to perform ROS: Mental status change     Physical Exam Updated Vital Signs BP (!) 111/50   Pulse 74   Temp 98.7 F (37.1 C) (Rectal)   Resp 21   SpO2 98%   Physical Exam  Constitutional: She appears well-developed and well-nourished.  HENT:  Head: Normocephalic and atraumatic.  Right Ear: External ear normal.  Left Ear: External ear normal.  Nose: Nose normal.  Eyes: Pupils are equal, round, and reactive to light. Right eye exhibits no discharge. Left eye exhibits no discharge.  Eyes deviated to left  Neck: Neck supple. No neck rigidity.  Cardiovascular: Normal rate, regular rhythm and normal heart sounds.   Pulmonary/Chest: Effort normal and breath sounds normal. No respiratory distress.  Abdominal: Soft. She exhibits distension. There is no tenderness.  Musculoskeletal: She exhibits edema (BLE).  Neurological: She is  unresponsive. GCS eye subscore is 1. GCS verbal subscore is 2. GCS motor subscore is 5.  Moves all 4 extremities due pain but no strength testing able to be performed. Groans but does not respond to questions.  Skin: Skin is warm and dry.  Erythema to the left lower abdomen, groin, and hip. Appears consistent with pressure injury  Nursing note and vitals reviewed.    ED Treatments / Results  Labs (all labs ordered are listed, but only abnormal results are displayed) Labs Reviewed  PROTIME-INR - Abnormal; Notable for the following:       Result Value   Prothrombin Time 20.3 (*)    All other components within normal limits  CBC - Abnormal; Notable for the following:    RBC 3.67 (*)    Hemoglobin 9.9 (*)    HCT 31.2 (*)    RDW 16.4 (*)    Platelets 93 (*)    All other components within normal limits  COMPREHENSIVE METABOLIC PANEL - Abnormal; Notable for the following:    Chloride 112 (*)    Glucose, Bld 141 (*)    BUN 41 (*)    Creatinine, Ser 2.10 (*)    Calcium 7.1 (*)    Total Protein 4.5 (*)    Albumin 2.1 (*)    GFR calc non  Af Amer 23 (*)    GFR calc Af Amer 27 (*)    All other components within normal limits  RAPID URINE DRUG SCREEN, HOSP PERFORMED - Abnormal; Notable for the following:    Benzodiazepines POSITIVE (*)    All other components within normal limits  URINALYSIS, ROUTINE W REFLEX MICROSCOPIC - Abnormal; Notable for the following:    Color, Urine AMBER (*)    APPearance HAZY (*)    Bilirubin Urine SMALL (*)    Protein, ur 30 (*)    Squamous Epithelial / LPF 0-5 (*)    All other components within normal limits  AMMONIA - Abnormal; Notable for the following:    Ammonia 114 (*)    All other components within normal limits  I-STAT CHEM 8, ED - Abnormal; Notable for the following:    BUN 39 (*)    Creatinine, Ser 2.10 (*)    Glucose, Bld 130 (*)    Calcium, Ion 0.99 (*)    Hemoglobin 10.5 (*)    HCT 31.0 (*)    All other components within normal limits  CBG MONITORING, ED - Abnormal; Notable for the following:    Glucose-Capillary 53 (*)    All other components within normal limits  I-STAT ARTERIAL BLOOD GAS, ED - Abnormal; Notable for the following:    pH, Arterial 7.320 (*)    pO2, Arterial 70.0 (*)    Acid-base deficit 4.0 (*)    All other components within normal limits  CBG MONITORING, ED - Abnormal; Notable for the following:    Glucose-Capillary 117 (*)    All other components within normal limits  ETHANOL  APTT  DIFFERENTIAL  CK  I-STAT TROPOININ, ED  CBG MONITORING, ED  CBG MONITORING, ED  CBG MONITORING, ED  CBG MONITORING, ED    EKG  EKG Interpretation None       Radiology Ct Head Wo Contrast  Result Date: 07/25/2016 CLINICAL DATA:  Altered mental status. EXAM: CT HEAD WITHOUT CONTRAST TECHNIQUE: Contiguous axial images were obtained from the base of the skull through the vertex without intravenous contrast. COMPARISON:  Head CT scan 05/05/2016. FINDINGS: Brain: Appears normal without  hemorrhage, infarct, mass lesion, mass effect, midline shift or abnormal  extra-axial fluid collection. No hydrocephalus or pneumocephalus. Vascular: Atherosclerosis noted. Skull: Intact. Sinuses/Orbits: Negative. Other: None. IMPRESSION: No acute abnormality. Atherosclerosis. Electronically Signed   By: Drusilla Kannerhomas  Dalessio M.D.   On: 07/25/2016 14:45   Dg Pelvis Portable  Result Date: 07/25/2016 CLINICAL DATA:  Soft tissue breakdown EXAM: PORTABLE PELVIS 1-2 VIEWS COMPARISON:  03/16/2015 FINDINGS: No pelvic fracture is identified. Scattered large and small bowel gas is seen. A relative mottled appearance is overlying soft tissues is noted which may be related to the known skin breakdown. IMPRESSION: No acute bony abnormality is noted. Electronically Signed   By: Alcide CleverMark  Lukens M.D.   On: 07/25/2016 13:49   Dg Chest Portable 1 View  Result Date: 07/25/2016 CLINICAL DATA:  Altered mental status EXAM: PORTABLE CHEST 1 VIEW COMPARISON:  07/22/2007 FINDINGS: Resolved atelectasis or pleural fluid at the left base. There is no edema, consolidation, effusion, or pneumothorax. Normal heart size for technique. Artifact from EKG leads overlapping the chest. IMPRESSION: Interval clearing of left basilar atelectasis. No evidence of active disease. Electronically Signed   By: Marnee SpringJonathon  Watts M.D.   On: 07/25/2016 13:48    Procedures Procedures (including critical care time)  CRITICAL CARE Performed by: Pricilla LovelessGOLDSTON, Maximos Zayas T   Total critical care time: 30 minutes  Critical care time was exclusive of separately billable procedures and treating other patients.  Critical care was necessary to treat or prevent imminent or life-threatening deterioration.  Critical care was time spent personally by me on the following activities: development of treatment plan with patient and/or surrogate as well as nursing, discussions with consultants, evaluation of patient's response to treatment, examination of patient, obtaining history from patient or surrogate, ordering and performing treatments and  interventions, ordering and review of laboratory studies, ordering and review of radiographic studies, pulse oximetry and re-evaluation of patient's condition.   Medications Ordered in ED Medications  lactulose (CHRONULAC) enema 200 gm (not administered)  LORazepam (ATIVAN) injection 1 mg (not administered)  rifaximin (XIFAXAN) tablet 550 mg (not administered)  dextrose 50 % solution 50 mL (50 mLs Intravenous Given 07/25/16 1339)  sodium chloride 0.9 % bolus 1,000 mL (1,000 mLs Intravenous New Bag/Given 07/25/16 1525)  lactulose (CHRONULAC) enema 200 gm (300 mLs Rectal Given 07/25/16 1639)     Initial Impression / Assessment and Plan / ED Course  I have reviewed the triage vital signs and the nursing notes.  Pertinent labs & imaging results that were available during my care of the patient were reviewed by me and considered in my medical decision making (see chart for details).  Clinical Course as of Jul 26 1730  Caleen EssexFri Jul 25, 2016  1322 Patient currently appears to be protecting airway. Has a positive gag reflex. Response to pain in all 4 extremity. This could be stroke, sepsis, or cirrhotic related. I do not think she needs emergent intubation at this time but will keep monitoring her closely.  [SG]  1538 AMS appears to be from hepatic encephalopathy. Without treatment her mental status has somewhat improved. She is still not answering questions and moans a lot but is now sitting in the midline with no eye deviation and seems to be moving more spontaneously. No indication for intubation.  [SG]  1556 Hospitalist to admit  [SG]    Clinical Course User Index [SG] Pricilla LovelessScott Tekisha Darcey, MD    Patient has been slowly improving despite no intervention. This was long after D50 so I don't  think it was hypoglycemia. She is still altered but no longer has a left sided gaze and doing more spontaneous movements. No obvious CNS pathology on CT head. Likely from hepatic encephalopathy. Started on rectal lactulose.  Also has AKI. Fluids, admit to hospitalist.  Final Clinical Impressions(s) / ED Diagnoses   Final diagnoses:  Hepatic encephalopathy (HCC)  Acute kidney injury [facility])    New Prescriptions New Prescriptions   No medications on file     Pricilla Loveless, MD 07/25/16 1733

## 2016-07-25 NOTE — ED Triage Notes (Addendum)
Brought to ED via [facility], from home, for eval of aloc. lsn last pm per report. Pt with left gaze and moaning, but no verbal response. CBG 79 per ems

## 2016-07-25 NOTE — ED Notes (Signed)
Pt returned from MRI °

## 2016-07-26 LAB — COMPREHENSIVE METABOLIC PANEL
ALT: 19 U/L (ref 14–54)
AST: 28 U/L (ref 15–41)
Albumin: 2.2 g/dL — ABNORMAL LOW (ref 3.5–5.0)
Alkaline Phosphatase: 133 U/L — ABNORMAL HIGH (ref 38–126)
Anion gap: 10 (ref 5–15)
BILIRUBIN TOTAL: 1.7 mg/dL — AB (ref 0.3–1.2)
BUN: 38 mg/dL — ABNORMAL HIGH (ref 6–20)
CHLORIDE: 110 mmol/L (ref 101–111)
CO2: 21 mmol/L — ABNORMAL LOW (ref 22–32)
CREATININE: 2.05 mg/dL — AB (ref 0.44–1.00)
Calcium: 7.3 mg/dL — ABNORMAL LOW (ref 8.9–10.3)
GFR, EST AFRICAN AMERICAN: 28 mL/min — AB (ref >60)
GFR, EST NON AFRICAN AMERICAN: 24 mL/min — AB (ref >60)
Glucose, Bld: 116 mg/dL — ABNORMAL HIGH (ref 65–99)
POTASSIUM: 4.4 mmol/L (ref 3.5–5.1)
Sodium: 141 mmol/L (ref 135–145)
TOTAL PROTEIN: 5 g/dL — AB (ref 6.5–8.1)

## 2016-07-26 LAB — AMMONIA: Ammonia: 57 umol/L — ABNORMAL HIGH (ref 9–35)

## 2016-07-26 LAB — CBC
HCT: 32.6 % — ABNORMAL LOW (ref 36.0–46.0)
Hemoglobin: 10.2 g/dL — ABNORMAL LOW (ref 12.0–15.0)
MCH: 26.6 pg (ref 26.0–34.0)
MCHC: 31.3 g/dL (ref 30.0–36.0)
MCV: 84.9 fL (ref 78.0–100.0)
Platelets: 89 10*3/uL — ABNORMAL LOW (ref 150–400)
RBC: 3.84 MIL/uL — ABNORMAL LOW (ref 3.87–5.11)
RDW: 16.3 % — AB (ref 11.5–15.5)
WBC: 7.2 10*3/uL (ref 4.0–10.5)

## 2016-07-26 LAB — GLUCOSE, CAPILLARY
GLUCOSE-CAPILLARY: 108 mg/dL — AB (ref 65–99)
GLUCOSE-CAPILLARY: 112 mg/dL — AB (ref 65–99)
GLUCOSE-CAPILLARY: 177 mg/dL — AB (ref 65–99)
Glucose-Capillary: 115 mg/dL — ABNORMAL HIGH (ref 65–99)
Glucose-Capillary: 129 mg/dL — ABNORMAL HIGH (ref 65–99)
Glucose-Capillary: 154 mg/dL — ABNORMAL HIGH (ref 65–99)
Glucose-Capillary: 185 mg/dL — ABNORMAL HIGH (ref 65–99)

## 2016-07-26 LAB — PROTIME-INR
INR: 1.6
PROTHROMBIN TIME: 19.2 s — AB (ref 11.4–15.2)

## 2016-07-26 LAB — OSMOLALITY, URINE: OSMOLALITY UR: 561 mosm/kg (ref 300–900)

## 2016-07-26 LAB — SODIUM, URINE, RANDOM

## 2016-07-26 LAB — MAGNESIUM: Magnesium: 1 mg/dL — ABNORMAL LOW (ref 1.7–2.4)

## 2016-07-26 LAB — CREATININE, URINE, RANDOM: Creatinine, Urine: 223.77 mg/dL

## 2016-07-26 MED ORDER — SODIUM CHLORIDE 0.9 % IV SOLN
INTRAVENOUS | Status: DC
  Administered 2016-07-26 – 2016-07-27 (×2): via INTRAVENOUS

## 2016-07-26 MED ORDER — HALOPERIDOL LACTATE 5 MG/ML IJ SOLN: 2.0000 mg | Freq: Four times a day (QID) | INTRAMUSCULAR | Status: DC | PRN

## 2016-07-26 MED ORDER — INSULIN GLARGINE 100 UNIT/ML ~~LOC~~ SOLN
20.0000 [IU] | Freq: Two times a day (BID) | SUBCUTANEOUS | Status: DC
  Administered 2016-07-26 – 2016-07-27 (×3): 20 [IU] via SUBCUTANEOUS
  Filled 2016-07-26 (×3): qty 0.2

## 2016-07-26 MED ORDER — INSULIN ASPART 100 UNIT/ML ~~LOC~~ SOLN
0.0000 [IU] | Freq: Three times a day (TID) | SUBCUTANEOUS | Status: DC
  Administered 2016-07-26: 1 [IU] via SUBCUTANEOUS
  Administered 2016-07-26: 2 [IU] via SUBCUTANEOUS
  Administered 2016-07-27: 5 [IU] via SUBCUTANEOUS
  Administered 2016-07-27: 1 [IU] via SUBCUTANEOUS
  Administered 2016-07-27: 5 [IU] via SUBCUTANEOUS
  Administered 2016-07-28: 1 [IU] via SUBCUTANEOUS

## 2016-07-26 MED ORDER — LACTULOSE 10 GM/15ML PO SOLN
30.0000 g | Freq: Three times a day (TID) | ORAL | Status: DC
Start: 2016-07-26 — End: 2016-07-28
  Administered 2016-07-26 – 2016-07-27 (×3): 30 g via ORAL
  Filled 2016-07-26 (×5): qty 45

## 2016-07-26 MED ORDER — VITAMIN K1 10 MG/ML IJ SOLN
2.0000 mg | Freq: Once | INTRAVENOUS | Status: AC
  Administered 2016-07-26: 2 mg via INTRAVENOUS
  Filled 2016-07-26: qty 0.2

## 2016-07-26 MED ORDER — ORAL CARE MOUTH RINSE
15.0000 mL | Freq: Two times a day (BID) | OROMUCOSAL | Status: DC
  Administered 2016-07-26 – 2016-07-27 (×2): 15 mL via OROMUCOSAL

## 2016-07-26 NOTE — Progress Notes (Signed)
Pt arrived to 5w06. Husband at the bedside. Pt non verbal at the time of admission, responds to voice, unable to fallow commands. VS collected, no signs of acute distress, cardiac monitor placed, family oriented to room and instructed to use call light to call for assistance. Will continue to monitor and treat pt.

## 2016-07-26 NOTE — Progress Notes (Signed)
PT Cancellation Note  Patient Details Name: Monica [name] MRN: [id number] DOB: 02/20/1951   Cancelled Treatment:    Reason Eval/Treat Not Completed: Patient declined, no reason specified (not today.  )Discussed with patient need to get mobilizing, patient reports MD said not today.  Unable to convince patient to participate. Will attempt again tomorrow.   Olivia CanterMoton, Daren Yeagle M 07/26/2016, 1:59 PM

## 2016-07-26 NOTE — Progress Notes (Signed)
Patient refusing nighttime meds of Lactulose and Heparin subcutaneous injection.  RN educated importance of lactulose and heparin, but continued to refuse. Patient was willing to take Xifaxin, but not lactulose because she stated, "I am tired of pooping all of the time." Will continue to monitor and treat per MD orders.

## 2016-07-26 NOTE — Progress Notes (Signed)
PROGRESS NOTE                                                                                                                                                                                                             Patient Demographics:    Monica [name], is a 66 y.o. female, DOB - 09/10/1950, ZOX:[id number]  Admit date - 07/25/2016   Admitting Physician Leroy SeaPrashant K Khy Pitre, MD  Outpatient Primary MD for the patient is HAGUE, Myrene GalasIMRAN P, MD  LOS - 1  Chief Complaint  Patient presents with  . Altered Mental Status       Brief Narrative    Monica [name]  is a 66 y.o. 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   Subjective:    Monica [name] today has, No headache, No chest pain, No abdominal pain - No Nausea, No new weakness tingling or numbness, No Cough - SOB.     Assessment  & Plan :     1. Severe hepatic encephalopathy. Much improved after lactulose enema which has been converted to oral lactulose, Xifaxan has been added, clinically much improved, head CT and MRI brain unremarkable. Continue supportive care, advance activity and PT evaluation. Likely discharge in 1-2 days.   2. ARF. Intravascular dehydration, does have third spacing of fluid due to her ascites, for now gently hydrate, obtain urine electrolytes, monitor renal function.  3. NASH with cirrhosis and esophageal varices. Supportive care and continue beta blocker, INR slightly elevated this morning we will give some vitamin K and monitor.  4. Dyslipidemia. Continue statin.  5. Hypertension. Continue beta blocker with caution and  monitor.  6. GERD. On PPI.  7. Carotid artery disease and PAD. Continue Plavix and statin for secondary prevention.  8. DM type II. Since oral intake is poor Will continue Lantus at half home dose along with  sliding scale.  CBG (last 3)   Recent Labs  07/26/16 0026 07/26/16 0408 07/26/16 0805  GLUCAP 112* 108* 115*    Lab Results  Component Value Date   HGBA1C 6.7 (H) 08/31/2013  Diet : DIET DYS 3 Room service appropriate? Yes; Fluid consistency: Thin    Family Communication  :  husband  Code Status :  Full  Disposition Plan  :  TBD  Consults  :  None  Procedures  :    CT and MRI brain = non acute  DVT Prophylaxis  :   Heparin   Lab Results  Component Value Date   PLT 89 (L) 07/26/2016   Lab Results  Component Value Date   INR 1.60 07/26/2016   INR 1.71 07/25/2016   INR 1.4 (H) 02/22/2015    Inpatient Medications  Scheduled Meds: . ALPRAZolam  0.5 mg Oral QHS  . clopidogrel  75 mg Oral Daily  . heparin  5,000 Units Subcutaneous Q8H  . insulin aspart  0-9 Units Subcutaneous TID WC  . insulin glargine  220 Units Subcutaneous BID  . lactulose  30 g Oral TID  . levothyroxine  75 mcg Oral QAC breakfast  . mouth rinse  15 mL Mouth Rinse BID  . phytonadione (VITAMIN K) IV  2 mg Intravenous Once  . propranolol  60 mg Oral Daily  . rifaximin  550 mg Oral BID  . sodium chloride flush  3 mL Intravenous Q12H   Continuous Infusions: . sodium chloride 50 mL/hr at 07/26/16 1124   PRN Meds:.albuterol, haloperidol lactate, [DISCONTINUED] ondansetron **OR** ondansetron (ZOFRAN) IV  Antibiotics  :    Anti-infectives    Start     Dose/Rate Route Frequency Ordered Stop   07/25/16 2200  rifaximin (XIFAXAN) tablet 550 mg     550 mg Oral 2 times daily 07/25/16 1629           Objective:   Vitals:   07/25/16 2055 07/26/16 0022 07/26/16 0552 07/26/16 0557  BP: (!) 141/38 139/60  (!) 143/56  Pulse: 78 87  91  Resp: 18 18  18   Temp: 98.1 F (36.7 C)  97.9 F (36.6 C)  97.6 F (36.4 C)  TempSrc: Oral Oral  Oral  SpO2: 98% 97%  99%  Weight:   48 kg (105 lb 12.8 oz)     Wt Readings from Last 3 Encounters:  07/26/16 48 kg (105 lb 12.8 oz)  02/22/15 115.7 kg (255 lb)  11/08/14 103.4 kg (228 lb)     Intake/Output Summary (Last 24 hours) at 07/26/16 1157 Last data filed at 07/26/16 0927  Gross per 24 hour  Intake           1467.5 ml  Output              150 ml  Net           1317.5 ml     Physical Exam  Awake Alert, Oriented X 2, No new F.N deficits, Normal affect [place].AT,PERRAL Supple Neck,No JVD, No cervical lymphadenopathy appriciated.  Symmetrical Chest wall movement, Good air movement bilaterally, CTAB RRR,No Gallops,Rubs or new Murmurs, No Parasternal Heave +ve B.Sounds, Abd Soft but has mild ascites, No tenderness, No organomegaly appriciated, No rebound - guarding or rigidity. No Cyanosis, Clubbing or edema, No new Rash or bruise     Data Review:    CBC  Recent Labs Lab 07/25/16 1346 07/25/16 1409 07/25/16 2110 07/26/16 0515  WBC 7.7  --  8.2 7.2  HGB 9.9* 10.5* 10.7* 10.2*  HCT 31.2* 31.0* 33.4* 32.6*  PLT 93*  --  97* 89*  MCV 85.0  --  85.6 84.9  MCH 27.0  --  27.4 26.6  MCHC 31.7  --  32.0 31.3  RDW 16.4*  --  16.5* 16.3*  LYMPHSABS 1.5  --   --   --   MONOABS 0.7  --   --   --   EOSABS 0.1  --   --   --   BASOSABS 0.0  --   --   --     Chemistries   Recent Labs Lab 07/25/16 1346 07/25/16 1409 07/25/16 2110 07/26/16 0515  NA 139 141  --  141  K 4.4 4.3  --  4.4  CL 112* 108  --  110  CO2 22  --   --  21*  GLUCOSE 141* 130*  --  116*  BUN 41* 39*  --  38*  CREATININE 2.10* 2.10* 2.09* 2.05*  CALCIUM 7.1*  --   --  7.3*  MG  --   --   --  1.0*  AST 26  --   --  28  ALT 17  --   --  19  ALKPHOS 101  --   --  133*  BILITOT 0.9  --   --  1.7*   ------------------------------------------------------------------------------------------------------------------ No results for input(s):  CHOL, HDL, LDLCALC, TRIG, CHOLHDL, LDLDIRECT in the last 72 hours.  Lab Results  Component Value Date   HGBA1C 6.7 (H) 08/31/2013   ------------------------------------------------------------------------------------------------------------------  Recent Labs  07/25/16 2110  TSH 2.610   ------------------------------------------------------------------------------------------------------------------ No results for input(s): VITAMINB12, FOLATE, FERRITIN, TIBC, IRON, RETICCTPCT in the last 72 hours.  Coagulation profile  Recent Labs Lab 07/25/16 1346 07/26/16 0515  INR 1.71 1.60    No results for input(s): DDIMER in the last 72 hours.  Cardiac Enzymes No results for input(s): CKMB, TROPONINI, MYOGLOBIN in the last 168 hours.  Invalid input(s): CK ------------------------------------------------------------------------------------------------------------------ No results found for: BNP  Micro Results No results found for this or any previous visit (from the past 240 hour(s)).  Radiology Reports Ct Head Wo Contrast  Result Date: 07/25/2016 CLINICAL DATA:  Altered mental status. EXAM: CT HEAD WITHOUT CONTRAST TECHNIQUE: Contiguous axial images were obtained from the base of the skull through the vertex without intravenous contrast. COMPARISON:  Head CT scan 05/05/2016. FINDINGS: Brain: Appears normal without hemorrhage, infarct, mass lesion, mass effect, midline shift or abnormal extra-axial fluid collection. No hydrocephalus or pneumocephalus. Vascular: Atherosclerosis noted. Skull: Intact. Sinuses/Orbits: Negative. Other: None. IMPRESSION: No acute abnormality. Atherosclerosis. Electronically Signed   By: Drusilla Kanner M.D.   On: 07/25/2016 14:45   Mr Brain Wo Contrast  Result Date: 07/25/2016 CLINICAL DATA:  Hepatic encephalopathy. Altered mental status. Cirrhosis. EXAM: MRI HEAD WITHOUT CONTRAST TECHNIQUE: Multiplanar, multiecho pulse sequences of the brain and surrounding  structures were obtained without intravenous contrast. COMPARISON:  MRI brain 08/22/2011. CT head without contrast 07/25/2016. FINDINGS: Brain: Mild atrophy and white matter changes are stable. 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 No acute infarct, hemorrhage, or mass lesion is present. The ventricles are of normal size. No significant extraaxial fluid collection is present. The brainstem and cerebellum are normal. The internal auditory canals are within normal limits bilaterally. Vascular: Abnormal signal in the right internal carotid artery is similar to the prior study with slow were likely occluded flow reconstituted at the level of the right posterior communicating artery. There is flow in the MCA branch vessels bilaterally. Flow is present in the left internal carotid artery and both vertebral arteries. Skull and upper cervical spine: The skullbase is within normal limits. The  craniocervical junction is normal. Midline sagittal structures are unremarkable. The marrow signal is normal. Sinuses/Orbits: The paranasal sinuses are clear. There is some fluid in the left mastoid air cells. The right mastoid air cells are clear. The globes and orbits are within normal limits. IMPRESSION: 1. No acute intracranial abnormality or significant interval change. 2. 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 3. Chronic occlusion of the right internal carotid artery. Electronically Signed   By: Marin Roberts M.D.   On: 07/25/2016 20:00   Dg Pelvis Portable  Result Date: 07/25/2016 CLINICAL DATA:  Soft tissue breakdown EXAM: PORTABLE PELVIS 1-2 VIEWS COMPARISON:  03/16/2015 FINDINGS: No pelvic fracture is identified. Scattered large and small bowel gas is seen. A relative mottled appearance is overlying soft tissues is noted which may be related to the known skin breakdown. IMPRESSION: No acute bony abnormality is noted. Electronically Signed    By: Alcide Clever M.D.   On: 07/25/2016 13:49   Dg Chest Portable 1 View  Result Date: 07/25/2016 CLINICAL DATA:  Altered mental status EXAM: PORTABLE CHEST 1 VIEW COMPARISON:  07/22/2007 FINDINGS: Resolved atelectasis or pleural fluid at the left base. There is no edema, consolidation, effusion, or pneumothorax. Normal heart size for technique. Artifact from EKG leads overlapping the chest. IMPRESSION: Interval clearing of left basilar atelectasis. No evidence of active disease. Electronically Signed   By: Marnee Spring M.D.   On: 07/25/2016 13:48    Time Spent in minutes  30   Leng Montesdeoca K M.D on 07/26/2016 at 11:57 AM  Between 7am to 7pm - Pager - [phone]  After 7pm go to [email or web address] - password [email or web address]  [facility] -  Office  [phone]

## 2016-07-26 NOTE — Evaluation (Signed)
Clinical/Bedside Swallow Evaluation Patient Details  Name: Monica [name] MRN: [id number] Date of Birth: 1950/11/08  Today's Date: 07/26/2016 Time: SLP Start Time (ACUTE ONLY): 1030 SLP Stop Time (ACUTE ONLY): 1045 SLP Time Calculation (min) (ACUTE ONLY): 15 min  Past Medical History:  Past Medical History:  Diagnosis Date  . Bilateral lower extremity edema   . Carotid artery bruit    Doppler 07/09/2011 - abnormal study, no change from previous  . Chest pain 09/02/2012   Normal, EF-67  . Coronary atherosclerosis of unspecified type of vessel, native or graft   . Endometrial polyp   . Esophageal varices (HCC)   . GERD (gastroesophageal reflux disease)   . Hiatal hernia   . Hyperlipidemia   . Hypertension   . Iron deficiency anemia   . Irritable bowel syndrome   . Macular degeneration (senile) of retina, unspecified   . Obesity, unspecified   . Osteoarthrosis, unspecified whether generalized or localized, unspecified site   . 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 SOB (shortness of breath)    Normal 2D echo - EF-58, 03/18/2001  . Tobacco abuse   . Type II or unspecified type diabetes mellitus without mention of complication, not stated as uncontrolled   . Unspecified sleep apnea    Past Surgical History:  Past Surgical History:  Procedure Laterality Date  . APPENDECTOMY    . CARDIAC CATHETERIZATION  03/18/2007   Normal coronary arteries and LV function  . CARDIAC CATHETERIZATION  07/01/2001   Normal cath  . CARDIAC CATHETERIZATION  05/17/1997   Normal cath  . CAROTID ENDARTERECTOMY    . CHOLECYSTECTOMY     HPI:  66 year old female with a history of cirrhosis, esophageal varices, PVD, GERD, hiatal hernia presented with altered mental status. EMS found her laying on her left side. She has had a left-sided gaze. They were seeing left-sided flaccidity. Admitted with severe hepatic encephalopathy. MRI  07/26/16 with no acute findings, stable white matter disease and remote lacunar infarct of left internal capsule. No prior swallowing evaluations per chart.   Assessment / Plan / Recommendation Clinical Impression  Patient presents with oropharyngeal swallowing function which appears grossly within functional limits at bedside for baseline diet (soft foods with thin liquids). Patient with mild risk for aspiration given lack of dentition requiring prolonged mastication, history of GERD (patient states well-managed, on protonix). Patient is alert, conversant and follows all commands. Oriented x3. Noted with cough x1 when patient began talking while masticating regular solid. With initial instruction to refrain from talking while chewing/swallowing, patient followed instructions with no further signs of aspiration. Given dentition and patient's typical soft diet, recommend initiating dys 3 diet with thin liquids, medications whole with liquid. Educated patient and her husband regarding general aspiration precautions, removing distractions and refraining from talking while eating. They verbalize understanding. No further follow-up for SLP recommended at this time. SLP will s/o.  SLP Visit Diagnosis: Dysphagia, unspecified (R13.10)    Aspiration Risk  Mild aspiration risk    Diet Recommendation Dysphagia 3 (Mech soft);Thin liquid   Liquid Administration via: Cup;Straw Medication Administration: Whole meds with liquid Supervision: Patient able to self feed Compensations: Minimize environmental distractions;Slow rate;Small sips/bites Postural Changes: Seated upright at 90 degrees    Other  Recommendations Oral Care Recommendations: Oral care BID   Follow up Recommendations None      Frequency and Duration  Prognosis        Swallow Study   General Date of Onset: 07/26/16 HPI: 66 year old female with a history of cirrhosis, esophageal varices, PVD, GERD, hiatal hernia presented with  altered mental status. EMS found her laying on her left side. She has had a left-sided gaze. They were seeing left-sided flaccidity. Admitted with severe hepatic encephalopathy. MRI 07/26/16 with no acute findings, stable white matter disease and remote lacunar infarct of left internal capsule. No prior swallowing evaluations per chart. Type of Study: Bedside Swallow Evaluation Previous Swallow Assessment: none in chart Diet Prior to this Study: NPO Temperature Spikes Noted: No Respiratory Status: Room air History of Recent Intubation: No Behavior/Cognition: Alert;Cooperative Oral Cavity Assessment: Within Functional Limits Oral Care Completed by SLP: No Oral Cavity - Dentition: Edentulous Vision: Impaired for self-feeding Self-Feeding Abilities: Needs assist;Needs set up;Able to feed self Patient Positioning: Upright in bed Baseline Vocal Quality: Normal Volitional Cough: Strong Volitional Swallow: Able to elicit    Oral/Motor/Sensory Function Overall Oral Motor/Sensory Function: Within functional limits   Ice Chips Ice chips: Within functional limits   Thin Liquid Thin Liquid: Within functional limits Presentation: Cup;Self Fed;Straw    Nectar Thick Nectar Thick Liquid: Not tested   Honey Thick Honey Thick Liquid: Not tested   Puree Puree: Within functional limits Presentation: Spoon   Solid   GO   Solid: Impaired Presentation: Self Fed Oral Phase Impairments: Impaired mastication Oral Phase Functional Implications: Impaired mastication Pharyngeal Phase Impairments: Cough - Immediate Other Comments:  (Coughed after talking while eating x1)       Rondel Baton, MS CF-SLP Speech-Language Pathologist [phone]  Arlana Lindau 07/26/2016,10:55 AM

## 2016-07-26 NOTE — Progress Notes (Signed)
Throughout the night pt became more alert and close to baseline according to husband.

## 2016-07-27 LAB — CBC
HCT: 33.1 % — ABNORMAL LOW (ref 36.0–46.0)
HEMOGLOBIN: 10.3 g/dL — AB (ref 12.0–15.0)
MCH: 26.7 pg (ref 26.0–34.0)
MCHC: 31.1 g/dL (ref 30.0–36.0)
MCV: 85.8 fL (ref 78.0–100.0)
PLATELETS: 91 10*3/uL — AB (ref 150–400)
RBC: 3.86 MIL/uL — ABNORMAL LOW (ref 3.87–5.11)
RDW: 16.5 % — ABNORMAL HIGH (ref 11.5–15.5)
WBC: 6.2 10*3/uL (ref 4.0–10.5)

## 2016-07-27 LAB — GLUCOSE, CAPILLARY
Glucose-Capillary: 134 mg/dL — ABNORMAL HIGH (ref 65–99)
Glucose-Capillary: 262 mg/dL — ABNORMAL HIGH (ref 65–99)
Glucose-Capillary: 268 mg/dL — ABNORMAL HIGH (ref 65–99)
Glucose-Capillary: 216 mg/dL — ABNORMAL HIGH (ref 65–99)

## 2016-07-27 LAB — PROTIME-INR
INR: 1.54
Prothrombin Time: 18.6 seconds — ABNORMAL HIGH (ref 11.4–15.2)

## 2016-07-27 LAB — HEMOGLOBIN A1C
Hgb A1c MFr Bld: 9.9 % — ABNORMAL HIGH (ref 4.8–5.6)
Mean Plasma Glucose: 237 mg/dL

## 2016-07-27 MED ORDER — VITAMIN K1 10 MG/ML IJ SOLN
2.0000 mg | Freq: Once | INTRAVENOUS | Status: AC
  Administered 2016-07-27: 2 mg via INTRAVENOUS
  Filled 2016-07-27: qty 0.2

## 2016-07-27 MED ORDER — LOPERAMIDE HCL 2 MG PO CAPS
2.0000 mg | ORAL_CAPSULE | Freq: Once | ORAL | Status: AC
  Administered 2016-07-27: 2 mg via ORAL
  Filled 2016-07-27: qty 1

## 2016-07-27 MED ORDER — SODIUM CHLORIDE 0.9 % IV SOLN
INTRAVENOUS | Status: AC
  Administered 2016-07-27: 900 mL via INTRAVENOUS
  Administered 2016-07-27: 1000 mL via INTRAVENOUS

## 2016-07-27 MED ORDER — INSULIN GLARGINE 100 UNIT/ML ~~LOC~~ SOLN
25.0000 [IU] | Freq: Two times a day (BID) | SUBCUTANEOUS | Status: DC
  Administered 2016-07-27: 25 [IU] via SUBCUTANEOUS
  Filled 2016-07-27 (×2): qty 0.25

## 2016-07-27 NOTE — Progress Notes (Signed)
PROGRESS NOTE                                                                                                                                                                                                             Patient Demographics:    Monica EmmsDiane [name], is a 66 y.o. female, DOB - 09/29/1950, YNW:[id number]  Admit date - 07/25/2016   Admitting Physician Leroy SeaPrashant K Laiylah Roettger, MD  Outpatient Primary MD for the patient is HAGUE, Myrene GalasIMRAN P, MD  LOS - 2  Chief Complaint  Patient presents with  . Altered Mental Status       Brief Narrative    Monica [name]  is a 66 y.o. 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   Subjective:    Monica Emmsiane Molony today has, No headache, No chest pain, No abdominal pain - No Nausea, No new weakness tingling or numbness, No Cough - SOB.     Assessment  & Plan :     1. Severe hepatic encephalopathy. Much improved after lactulose enema which has been converted to oral lactulose, Xifaxan has been added, clinically much improved, She is now noncompliant with lactulose, has been extensively counseled, head CT and MRI brain unremarkable. Continue supportive care, advance activity and PT evaluation. Likely discharge in AM.   2. ARF. Intravascular dehydration, improving with IV fluids continue.  3. NASH with cirrhosis and esophageal varices. Supportive care and continue beta blocker, INR was slightly elevated, improving with vitamin K, monitor.  4. Dyslipidemia. Continue statin.  5. Hypertension. Continue beta blocker with caution and monitor.  6. GERD. On PPI.  7.  Carotid artery disease and PAD. Continue Plavix and statin for secondary prevention.  8. DM type II. Since oral intake is poor will continue Lantus at half home dose along with  sliding scale.  CBG (last 3)   Recent Labs  07/26/16 2208 07/26/16 2238 07/27/16 0827  GLUCAP 154* 185* 134*    Lab Results  Component Value Date   HGBA1C 6.7 (H) 08/31/2013     Diet : DIET DYS 3 Room service  appropriate? Yes; Fluid consistency: Thin    Family Communication  :  husband  Code Status :  Full  Disposition Plan  :  TBD  Consults  :  None  Procedures  :    CT and MRI brain = non acute  DVT Prophylaxis  :   Heparin   Lab Results  Component Value Date   PLT 91 (L) 07/27/2016   Lab Results  Component Value Date   INR 1.54 07/27/2016   INR 1.60 07/26/2016   INR 1.71 07/25/2016    Inpatient Medications  Scheduled Meds: . ALPRAZolam  0.5 mg Oral QHS  . clopidogrel  75 mg Oral Daily  . heparin  5,000 Units Subcutaneous Q8H  . insulin aspart  0-9 Units Subcutaneous TID WC  . insulin glargine  20 Units Subcutaneous BID  . lactulose  30 g Oral TID  . levothyroxine  75 mcg Oral QAC breakfast  . mouth rinse  15 mL Mouth Rinse BID  . propranolol  60 mg Oral Daily  . rifaximin  550 mg Oral BID  . sodium chloride flush  3 mL Intravenous Q12H   Continuous Infusions: . sodium chloride 900 mL (07/27/16 0749)   PRN Meds:.albuterol, haloperidol lactate, [DISCONTINUED] ondansetron **OR** ondansetron (ZOFRAN) IV  Antibiotics  :    Anti-infectives    Start     Dose/Rate Route Frequency Ordered Stop   07/25/16 2200  rifaximin (XIFAXAN) tablet 550 mg     550 mg Oral 2 times daily 07/25/16 1629           Objective:   Vitals:   07/26/16 1516 07/26/16 2234 07/27/16 0500 07/27/16 0607  BP: (!) 124/48 (!) 130/58  136/62  Pulse: 71 68  68  Resp: 20 18  18   Temp: 97.8 F (36.6 C) 97.6 F (36.4 C)  97.6 F (36.4 C)  TempSrc: Oral Oral  Oral  SpO2: 95% 99%  99%  Weight:    102 kg (224 lb 13.9 oz)     Wt Readings from Last 3 Encounters:  07/27/16 102 kg (224 lb 13.9 oz)  02/22/15 115.7 kg (255 lb)  11/08/14 103.4 kg (228 lb)     Intake/Output Summary (Last 24 hours) at 07/27/16 1001 Last data filed at 07/27/16 0626  Gross per 24 hour  Intake          1371.67 ml  Output                0 ml  Net          1371.67 ml     Physical Exam  Awake Alert, Oriented X 2, No new F.N deficits, Normal affect Percy.AT,PERRAL Supple Neck,No JVD, No cervical lymphadenopathy appriciated.  Symmetrical Chest wall movement, Good air movement bilaterally, CTAB RRR,No Gallops,Rubs or new Murmurs, No Parasternal Heave +ve B.Sounds, Abd Soft but has mild ascites, No tenderness, No organomegaly appriciated, No rebound - guarding or rigidity. No Cyanosis, Clubbing or edema, No new Rash or bruise     Data Review:    CBC  Recent Labs Lab 07/25/16 1346 07/25/16 1409 07/25/16 2110 07/26/16 0515 07/27/16 0708  WBC 7.7  --  8.2 7.2 6.2  HGB 9.9* 10.5* 10.7* 10.2* 10.3*  HCT 31.2* 31.0* 33.4* 32.6* 33.1*  PLT 93*  --  97* 89* 91*  MCV 85.0  --  85.6 84.9 85.8  MCH 27.0  --  27.4 26.6 26.7  MCHC 31.7  --  32.0 31.3 31.1  RDW 16.4*  --  16.5* 16.3* 16.5*  LYMPHSABS 1.5  --   --   --   --   MONOABS 0.7  --   --   --   --   EOSABS 0.1  --   --   --   --   BASOSABS 0.0  --   --   --   --     Chemistries   Recent Labs Lab 07/25/16 1346 07/25/16 1409 07/25/16 2110 07/26/16 0515  NA 139 141  --  141  K 4.4 4.3  --  4.4  CL 112* 108  --  110  CO2 22  --   --  21*  GLUCOSE 141* 130*  --  116*  BUN 41* 39*  --  38*  CREATININE 2.10* 2.10* 2.09* 2.05*  CALCIUM 7.1*  --   --  7.3*  MG  --   --   --  1.0*  AST 26  --   --  28  ALT 17  --   --  19  ALKPHOS 101  --   --  133*  BILITOT 0.9  --   --  1.7*   ------------------------------------------------------------------------------------------------------------------ No results for input(s): CHOL, HDL, LDLCALC,  TRIG, CHOLHDL, LDLDIRECT in the last 72 hours.  Lab Results  Component Value Date   HGBA1C 6.7 (H) 08/31/2013   ------------------------------------------------------------------------------------------------------------------  Recent Labs  07/25/16 2110  TSH 2.610   ------------------------------------------------------------------------------------------------------------------ No results for input(s): VITAMINB12, FOLATE, FERRITIN, TIBC, IRON, RETICCTPCT in the last 72 hours.  Coagulation profile  Recent Labs Lab 07/25/16 1346 07/26/16 0515 07/27/16 0708  INR 1.71 1.60 1.54    No results for input(s): DDIMER in the last 72 hours.  Cardiac Enzymes No results for input(s): CKMB, TROPONINI, MYOGLOBIN in the last 168 hours.  Invalid input(s): CK ------------------------------------------------------------------------------------------------------------------ No results found for: BNP  Micro Results No results found for this or any previous visit (from the past 240 hour(s)).  Radiology Reports Ct Head Wo Contrast  Result Date: 07/25/2016 CLINICAL DATA:  Altered mental status. EXAM: CT HEAD WITHOUT CONTRAST TECHNIQUE: Contiguous axial images were obtained from the base of the skull through the vertex without intravenous contrast. COMPARISON:  Head CT scan 05/05/2016. FINDINGS: Brain: Appears normal without hemorrhage, infarct, mass lesion, mass effect, midline shift or abnormal extra-axial fluid collection. No hydrocephalus or pneumocephalus. Vascular: Atherosclerosis noted. Skull: Intact. Sinuses/Orbits: Negative. Other: None. IMPRESSION: No acute abnormality. Atherosclerosis. Electronically Signed   By: Drusilla Kanner M.D.   On: 07/25/2016 14:45   Mr Brain Wo Contrast  Result Date: 07/25/2016 CLINICAL DATA:  Hepatic encephalopathy. Altered mental status. Cirrhosis. EXAM: MRI HEAD WITHOUT CONTRAST TECHNIQUE: Multiplanar, multiecho pulse sequences of the brain and surrounding  structures were obtained without intravenous contrast. COMPARISON:  MRI brain 08/22/2011. CT head without contrast 07/25/2016. FINDINGS: Brain: Mild atrophy and white matter changes are stable. 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 No acute infarct, hemorrhage, or mass lesion is present. The ventricles are of normal size. No significant extraaxial fluid collection is present. The brainstem and cerebellum are normal. The internal auditory canals are within normal limits bilaterally. Vascular: Abnormal signal in the right internal carotid artery is similar to the prior study with slow were likely occluded flow reconstituted at the level of the right posterior communicating artery. There is flow in the MCA branch vessels bilaterally. Flow is present in the left internal carotid artery and both vertebral arteries. Skull and upper cervical spine: The skullbase is within  normal limits. The craniocervical junction is normal. Midline sagittal structures are unremarkable. The marrow signal is normal. Sinuses/Orbits: The paranasal sinuses are clear. There is some fluid in the left mastoid air cells. The right mastoid air cells are clear. The globes and orbits are within normal limits. IMPRESSION: 1. No acute intracranial abnormality or significant interval change. 2. 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 3. Chronic occlusion of the right internal carotid artery. Electronically Signed   By: Marin Roberts M.D.   On: 07/25/2016 20:00   Dg Pelvis Portable  Result Date: 07/25/2016 CLINICAL DATA:  Soft tissue breakdown EXAM: PORTABLE PELVIS 1-2 VIEWS COMPARISON:  03/16/2015 FINDINGS: No pelvic fracture is identified. Scattered large and small bowel gas is seen. A relative mottled appearance is overlying soft tissues is noted which may be related to the known skin breakdown. IMPRESSION: No acute bony abnormality is noted. Electronically Signed    By: Alcide Clever M.D.   On: 07/25/2016 13:49   Dg Chest Portable 1 View  Result Date: 07/25/2016 CLINICAL DATA:  Altered mental status EXAM: PORTABLE CHEST 1 VIEW COMPARISON:  07/22/2007 FINDINGS: Resolved atelectasis or pleural fluid at the left base. There is no edema, consolidation, effusion, or pneumothorax. Normal heart size for technique. Artifact from EKG leads overlapping the chest. IMPRESSION: Interval clearing of left basilar atelectasis. No evidence of active disease. Electronically Signed   By: Marnee Spring M.D.   On: 07/25/2016 13:48    Time Spent in minutes  30   Ramia Sidney K M.D on 07/27/2016 at 10:01 AM  Between 7am to 7pm - Pager - [phone]  After 7pm go to [email or web address] - password [facility]  [facility] -  Office  [phone]

## 2016-07-27 NOTE — Progress Notes (Signed)
Patient refusing Lactulose this AM. She was educated about the importance of Lactulose, but continue to refuse. MD made aware and also her husband was informed about her refusing medicine. Will continue to monitor.

## 2016-07-27 NOTE — Progress Notes (Signed)
Patient and her husband is complaining of having a lot of BMs and most of them are watery because of lactulose that she is taking. She asked the doctor if she can have something to stop her diarrhea. Per MD verbal order, patient will receive one dose of Imodium 2mg  and will hold next dose of Lactulose. Will continue to monitor.

## 2016-07-27 NOTE — Evaluation (Signed)
Physical Therapy Evaluation Patient Details Name: Monica AlmondDiane Y [place]ville MRN: [id number] DOB: 03/11/1951 Today's Date: 07/27/2016   History of Present Illness  Pt admitted through ED on 07/25/16 following a fall at the MD office. Pt was diagnosed with hepatic encephalopathy. PMH significant for DM2, GERD, CAD, PAD, HTN, ARF.  Clinical Impression  Pt presents with the above diagnosis and below deficits. Prior to admission, pt was living with her husband and daughter in a single level home with a ramp leading into front door. Pt has not been OOB since being admitted to hospital and has generalized weakness and multiple bouts of diarrhea when attempting to mobilize. Pt may benefit from short term rehab services in order to assist with a safe return home. Pt will benefit from continued acute PT services to further assess mobilization and safety.     Follow Up Recommendations SNF;Supervision/Assistance - 24 hour    Equipment Recommendations  None recommended by PT    Recommendations for Other Services       Precautions / Restrictions Precautions Precautions: Fall Restrictions Weight Bearing Restrictions: No      Mobility  Bed Mobility Overal bed mobility: Needs Assistance Bed Mobility: Rolling;Sidelying to Sit Rolling: Min assist Sidelying to sit: Min assist       General bed mobility comments: Min A to perform hygiene and cues for use of railings. Min a to stabilize trunk at EOB  Transfers Overall transfer level: Needs assistance Equipment used: Rolling walker (2 wheeled) Transfers: Sit to/from [employer] CorporationStand;Stand Pivot Transfers Sit to Stand: Min assist Stand pivot transfers: Min assist       General transfer comment: Min A for safety with sit to stand and stand pivot transfer. Pt having multiple BM's.   Ambulation/Gait             General Gait Details: Unable to assess this session  Stairs            Wheelchair Mobility    Modified Rankin (Stroke Patients Only)        Balance                                             Pertinent Vitals/Pain Pain Assessment: No/denies pain    Home Living Family/patient expects to be discharged to:: Private residence Living Arrangements: Spouse/significant other;Other relatives;Other (Comment) (Granddaughter) Available Help at Discharge: Family;Available 24 hours/day Type of Home: Mobile home Home Access: Ramped entrance     Home Layout: One level Home Equipment: Walker - 2 wheels;Cane - single point;Bedside commode;Grab bars - tub/shower;Grab bars - toilet      Prior Function Level of Independence: Needs assistance   Gait / Transfers Assistance Needed: used Rw occasionally  ADL's / Homemaking Assistance Needed: Husband assists with showering, cooking, and assists with feeding at times        Hand Dominance   Dominant Hand: Right    Extremity/Trunk Assessment   Upper Extremity Assessment Upper Extremity Assessment: Defer to OT evaluation    Lower Extremity Assessment Lower Extremity Assessment: Generalized weakness       Communication   Communication: No difficulties  Cognition Arousal/Alertness: Awake/alert Behavior During Therapy: WFL for tasks assessed/performed Overall Cognitive Status: No family/caregiver present to determine baseline cognitive functioning                      General Comments General comments (skin  integrity, edema, etc.): Pt was having multiple BM's throughout session and was unable to perform gait this session.     Exercises     Assessment/Plan    PT Assessment Patient needs continued PT services  PT Problem List Decreased strength;Decreased activity tolerance;Decreased balance;Decreased mobility       PT Treatment Interventions Gait training;Functional mobility training;Therapeutic activities;Therapeutic exercise;Balance training    PT Goals (Current goals can be found in the Care Plan section)  Acute Rehab PT Goals Patient  Stated Goal: to go home PT Goal Formulation: With patient Time For Goal Achievement: 08/03/16 Potential to Achieve Goals: Good    Frequency Min 3X/week   Barriers to discharge        Co-evaluation               End of Session Equipment Utilized During Treatment: Gait belt Activity Tolerance: Patient tolerated treatment well;Patient limited by fatigue Patient left: with call bell/phone within reach;Other (comment) (on [facility]) Nurse Communication: Mobility status;Other (comment) (Pt is on [facility] and needs assistance once she's finished. ) PT Visit Diagnosis: Difficulty in walking, not elsewhere classified (R26.2);Muscle weakness (generalized) (M62.81)         Time: 4098-1191 PT Time Calculation (min) (ACUTE ONLY): 45 min   Charges:   PT Evaluation $PT Eval Moderate Complexity: 1 Procedure PT Treatments $Therapeutic Activity: 23-37 mins   PT G Codes:         Colin Broach PT, DPT  [phone]  07/27/2016, 1:34 PM

## 2016-07-28 LAB — BASIC METABOLIC PANEL
ANION GAP: 7 (ref 5–15)
BUN: 37 mg/dL — ABNORMAL HIGH (ref 6–20)
CHLORIDE: 111 mmol/L (ref 101–111)
CO2: 20 mmol/L — ABNORMAL LOW (ref 22–32)
Calcium: 7 mg/dL — ABNORMAL LOW (ref 8.9–10.3)
Creatinine, Ser: 1.99 mg/dL — ABNORMAL HIGH (ref 0.44–1.00)
GFR calc Af Amer: 29 mL/min — ABNORMAL LOW (ref >60)
GFR calc non Af Amer: 25 mL/min — ABNORMAL LOW (ref >60)
GLUCOSE: 195 mg/dL — AB (ref 65–99)
POTASSIUM: 3.6 mmol/L (ref 3.5–5.1)
SODIUM: 138 mmol/L (ref 135–145)

## 2016-07-28 LAB — PROTIME-INR
INR: 1.57
PROTHROMBIN TIME: 18.9 s — AB (ref 11.4–15.2)

## 2016-07-28 LAB — GLUCOSE, CAPILLARY: Glucose-Capillary: 135 mg/dL — ABNORMAL HIGH (ref 65–99)

## 2016-07-28 MED ORDER — RIFAXIMIN 550 MG PO TABS: 550.0000 mg | ORAL_TABLET | Freq: Two times a day (BID) | ORAL | 0 refills | Status: DC

## 2016-07-28 MED ORDER — LACTULOSE 10 GM/15ML PO SOLN: 20.0000 g | Freq: Two times a day (BID) | ORAL | 0 refills | Status: DC

## 2016-07-28 MED ORDER — FUROSEMIDE 40 MG PO TABS: 40.0000 mg | ORAL_TABLET | Freq: Every day | ORAL | 2 refills | Status: DC

## 2016-07-28 MED ORDER — SPIRONOLACTONE 100 MG PO TABS: 100.0000 mg | ORAL_TABLET | Freq: Every day | ORAL | 3 refills | Status: DC

## 2016-07-28 MED ORDER — GABAPENTIN 300 MG PO CAPS: 300.0000 mg | ORAL_CAPSULE | Freq: Three times a day (TID) | ORAL | Status: DC

## 2016-07-28 NOTE — Discharge Summary (Signed)
Monica [name]:[id number] DOB: 1950/12/05 DOA: 07/25/2016  PCP: Galvin Proffer, MD  Admit date: 07/25/2016  Discharge date: 07/28/2016  Admitted From:  Home   Disposition:  Home   Recommendations for Outpatient Follow-up:   Follow up with PCP in 1-2 weeks  PCP Please obtain BMP/CBC, 2 view CXR in 1week,  (see Discharge instructions)   PCP Please follow up on the following pending results: Monitor CBC, CMP, Ammonia closely   Home Health: PT, RN   Equipment/Devices: None  Consultations: None Discharge Condition: Stable   CODE STATUS: Full   Diet Recommendation: Diet heart healthy/carb modified, 1.5 lit/day fluid restriction  Chief Complaint  Patient presents with  . 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                                                                 Hospital Course    1. Severe hepatic encephalopathy. Much improved after lactulose enema which has been converted to oral lactulose, Xifaxan has been added, clinically much improved, She is now noncompliant with lactuloseIn the hospital and says will not take at home, has been extensively counseled, head CT and MRI brain unremarkable. Her mental status is back to baseline, husband bedside both counseled on compliance. We will place her on moderate dose oral lactulose and  will prescribe Xifaxan hopefully it will get approved, we will have her follow with PCP and GI physician closely. Request PCP to check CBC, CMP and ammonia level within a week and ensure outpatient GI follow-up within 1-2 weeks as well.   2.ARF. Intravascular dehydration,Improved with IV fluids, renal function much better, will hold diuretics for another 48 hours post discharge thereafter PCP to monitor renal function closely, I'm cutting her diuretic dose in half upon discharge.  3.NASH with cirrhosis and esophageal varices. Supportive care and continue beta blocker, INR was slightly elevated, improved with vitamin K .  4.Dyslipidemia. Continue statin.  5.Hypertension. Continue beta blocker  .  6.GERD. On PPI.  7.Carotid artery disease and PAD. Continue Plavix and statin for secondary prevention.  8.DM type II. Tinea home regimen follow with PCP for glycemic control.   Discharge diagnosis     Principal Problem:   Hepatic encephalopathy (HCC) Active Problems:   Obesity   Macular degeneration (senile) of retina   Coronary atherosclerosis   Hypothyroidism   Bilateral carotid artery  disease (HCC)   Tobacco abuse   Esophageal varices without bleeding (HCC)   Ascites    Discharge instructions    Discharge Instructions    Discharge instructions    Complete by:  As directed    Follow with Primary MD HAGUE, Myrene Galas, MD in 3 days and your GI MD in 1 week  Get CBC, CMP, Ammonia, checked  by Primary MD  in 3 days ( we routinely change or add medications that can affect your baseline labs and fluid status, therefore we recommend that you get the mentioned basic workup next visit with your PCP, your PCP may decide not to get them or add new tests based on their clinical decision)  Activity: As tolerated with Full fall precautions use walker/cane & assistance as needed  Disposition Home    Diet:   Diet heart healthy/carb modified.   Check your Weight same time  everyday, if you gain over 2 pounds, or you develop in leg swelling, experience more shortness of breath or chest pain, call your Primary MD immediately. Follow Cardiac Low Salt Diet and 1.5 lit/day fluid restriction.  On your next visit with your primary care physician please Get Medicines reviewed and adjusted.  Please request your Prim.MD to go over all Hospital Tests and Procedure/Radiological results at the follow up, please get all Hospital records sent to your Prim MD by signing hospital release before you go home.  If you experience worsening of your admission symptoms, develop shortness of breath, life threatening emergency, suicidal or homicidal thoughts you must seek medical attention immediately by calling 911 or calling your MD immediately  if symptoms less severe.  You Must read complete instructions/literature along with all the possible adverse reactions/side effects for all the Medicines you take and that have been prescribed to you. Take any new Medicines after you have completely understood and accpet all the possible adverse reactions/side effects.   Do not drive, operate heavy machinery, perform activities at heights, swimming or participation in water activities or provide baby sitting services if your were admitted for syncope or siezures until you have seen by Primary MD or a Neurologist and advised to do so again.  Do not drive when taking Pain medications.    Do not take more than prescribed Pain, Sleep and Anxiety Medications  Special Instructions: If you have smoked or chewed Tobacco  in the last 2 yrs please stop smoking, stop any regular Alcohol  and or any Recreational drug use.  Wear Seat belts while driving.   Please note  You were cared for by a hospitalist during your hospital stay. If you have any questions about your discharge medications or the care you received while you were in the hospital after you are discharged, you can call the unit and asked to  speak with the hospitalist on call if the hospitalist that took care of you is not available. Once you are discharged, your primary care physician will handle any further medical issues. Please note that NO REFILLS for any discharge medications will be authorized once you are discharged, as it is imperative that you return to your primary care physician (or establish a relationship with a primary care physician if you do not have one) for your aftercare needs so that they can reassess your need for medications and monitor your lab values.   Increase activity slowly    Complete by:  As directed       Discharge Medications   Allergies as of  07/28/2016      Reactions   Augmentin [amoxicillin-pot Clavulanate]    Unknown    Biaxin [clarithromycin]    Unknown   Invokana [canagliflozin]    Unknown   Latex    Unknown   Sulfonamide Derivatives    Penicillins Rash   Has patient had a PCN reaction causing immediate rash, facial/tongue/throat swelling, SOB or lightheadedness with hypotension:YES Has patient had a PCN reaction causing severe rash involving mucus membranes or skin necrosis: NO Has patient had a PCN reaction that required hospitalizationNO Has patient had a PCN reaction occurring within the last 10 years: NO If all of the above answers are "NO", then may proceed with Cephalosporin use.      Medication List    STOP taking these medications   promethazine 25 MG tablet Commonly known as:  PHENERGAN     TAKE these medications   ALPRAZolam 1 MG tablet Commonly known as:  XANAX Take 1 mg by mouth 2 (two) times daily as needed.   clopidogrel 75 MG tablet Commonly known as:  PLAVIX Take 75 mg by mouth daily.   furosemide 40 MG tablet Commonly known as:  LASIX Take 1 tablet (40 mg total) by mouth daily. <PLEASE MAKE APPOINTMENT FOR REFILLS> Start taking on:  07/30/2016 What changed:  when to take this   gabapentin 300 MG capsule Commonly known as:  NEURONTIN Take 1 capsule  (300 mg total) by mouth 3 (three) times daily. Start taking on:  07/30/2016   isosorbide mononitrate 20 MG tablet Commonly known as:  ISMO,MONOKET Take 20 mg by mouth daily.   lactulose 10 GM/15ML solution Commonly known as:  CHRONULAC Take 30 mLs (20 g total) by mouth 2 (two) times daily.   LANTUS SOLOSTAR 100 UNIT/ML Solostar Pen Generic drug:  Insulin Glargine INJECT 20 TO 24 UNITS TWICE DAILY   levothyroxine 75 MCG tablet Commonly known as:  SYNTHROID, LEVOTHROID Take 75 mcg by mouth daily. Brand Name Only   NOVOLOG FLEXPEN 100 UNIT/ML FlexPen Generic drug:  insulin aspart INJECT 20 UNITS INTO THE SKIN 3 TIMES DAILY WITH MEALS   pantoprazole 40 MG tablet Commonly known as:  PROTONIX Take 40 mg by mouth daily.   propranolol 60 MG tablet Commonly known as:  INDERAL Take 60 mg by mouth daily.   rifaximin 550 MG Tabs tablet Commonly known as:  XIFAXAN Take 1 tablet (550 mg total) by mouth 2 (two) times daily.   simvastatin 40 MG tablet Commonly known as:  ZOCOR Take 40 mg by mouth. Three times weekly   spironolactone 100 MG tablet Commonly known as:  ALDACTONE Take 1 tablet (100 mg total) by mouth daily. What changed:  when to take this   VENTOLIN HFA 108 (90 Base) MCG/ACT inhaler Generic drug:  albuterol Inhale 2 puffs into the lungs every 6 (six) hours as needed for shortness of breath.       Follow-up Information    HAGUE, Myrene Galas, MD. Schedule an appointment as soon as possible for a visit in 3 day(s).   Specialty:  Internal Medicine Why:  and your GI MD in 1 week Contact information: [address] [place] [address] [phone]        Yancey Flemings, MD. Schedule an appointment as soon as possible for a visit in 1 week(s).   Specialty:  Gastroenterology Contact information: [address] [place] [address] [phone]           Major procedures and Radiology Reports -  PLEASE review detailed and final reports thoroughly  -        Ct Head Wo Contrast  Result Date: 07/25/2016 CLINICAL DATA:  Altered mental status. EXAM: CT HEAD WITHOUT CONTRAST TECHNIQUE: Contiguous axial images were obtained from the base of the skull through the vertex without intravenous contrast. COMPARISON:  Head CT scan 05/05/2016. FINDINGS: Brain: Appears normal without hemorrhage, infarct, mass lesion, mass effect, midline shift or abnormal extra-axial fluid collection. No hydrocephalus or pneumocephalus. Vascular: Atherosclerosis noted. Skull: Intact. Sinuses/Orbits: Negative. Other: None. IMPRESSION: No acute abnormality. Atherosclerosis. Electronically Signed   By: Drusilla Kannerhomas  Dalessio M.D.   On: 07/25/2016 14:45   Mr Brain Wo Contrast  Result Date: 07/25/2016 CLINICAL DATA:  Hepatic encephalopathy. Altered mental status. Cirrhosis. EXAM: MRI HEAD WITHOUT CONTRAST TECHNIQUE: Multiplanar, multiecho pulse sequences of the brain and surrounding structures were obtained without intravenous contrast. COMPARISON:  MRI brain 08/22/2011. CT head without contrast 07/25/2016. FINDINGS: Brain: Mild atrophy and white matter changes are stable. 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 No acute infarct, hemorrhage, or mass lesion is present. The ventricles are of normal size. No significant extraaxial fluid collection is present. The brainstem and cerebellum are normal. The internal auditory canals are within normal limits bilaterally. Vascular: Abnormal signal in the right internal carotid artery is similar to the prior study with slow were likely occluded flow reconstituted at the level of the right posterior communicating artery. There is flow in the MCA branch vessels bilaterally. Flow is present in the left internal carotid artery and both vertebral arteries. Skull and upper cervical spine: The skullbase is within normal limits. The craniocervical junction is normal. Midline sagittal structures are unremarkable. The marrow signal is normal.  Sinuses/Orbits: The paranasal sinuses are clear. There is some fluid in the left mastoid air cells. The right mastoid air cells are clear. The globes and orbits are within normal limits. IMPRESSION: 1. No acute intracranial abnormality or significant interval change. 2. 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 3. Chronic occlusion of the right internal carotid artery. Electronically Signed   By: Marin Robertshristopher  Mattern M.D.   On: 07/25/2016 20:00   Dg Pelvis Portable  Result Date: 07/25/2016 CLINICAL DATA:  Soft tissue breakdown EXAM: PORTABLE PELVIS 1-2 VIEWS COMPARISON:  03/16/2015 FINDINGS: No pelvic fracture is identified. Scattered large and small bowel gas is seen. A relative mottled appearance is overlying soft tissues is noted which may be related to the known skin breakdown. IMPRESSION: No acute bony abnormality is noted. Electronically Signed   By: Alcide CleverMark  Lukens M.D.   On: 07/25/2016 13:49   Dg Chest Portable 1 View  Result Date: 07/25/2016 CLINICAL DATA:  Altered mental status EXAM: PORTABLE CHEST 1 VIEW COMPARISON:  07/22/2007 FINDINGS: Resolved atelectasis or pleural fluid at the left base. There is no edema, consolidation, effusion, or pneumothorax. Normal heart size for technique. Artifact from EKG leads overlapping the chest. IMPRESSION: Interval clearing of left basilar atelectasis. No evidence of active disease. Electronically Signed   By: Marnee SpringJonathon  Watts M.D.   On: 07/25/2016 13:48    Micro Results     No results found for this or any previous visit (from the past 240 hour(s)).  Today   Subjective    Zaniyah Axel FillerCausey today has no headache,no chest abdominal pain,no new weakness tingling or numbness, feels much better wants to go home today.     Objective   Blood pressure (!) 122/51, pulse 68,  temperature 97.7 F (36.5 C), temperature source Oral, resp. rate 18, weight 106 kg (233 lb 11.2 oz), SpO2 98 %.   Intake/Output Summary  (Last 24 hours) at 07/28/16 0949 Last data filed at 07/28/16 0300  Gross per 24 hour  Intake          1721.75 ml  Output                0 ml  Net          1721.75 ml    Exam Awake Alert, Oriented x 3, No new F.N deficits, Normal affect [place].AT,PERRAL Supple Neck,No JVD, No cervical lymphadenopathy appriciated.  Symmetrical Chest wall movement, Good air movement bilaterally, CTAB RRR,No Gallops,Rubs or new Murmurs, No Parasternal Heave +ve B.Sounds, Abd Soft mild ascites, Non tender, No organomegaly appriciated, No rebound -guarding or rigidity. No Cyanosis, Clubbing or edema, No new Rash or bruise   Data Review   CBC w Diff: Lab Results  Component Value Date   WBC 6.2 07/27/2016   HGB 10.3 (L) 07/27/2016   HGB 12.5 06/26/2009   HCT 33.1 (L) 07/27/2016   HCT 37.4 06/26/2009   PLT 91 (L) 07/27/2016   PLT 133 (L) 06/26/2009   LYMPHOPCT 20 07/25/2016   LYMPHOPCT 27.5 06/26/2009   MONOPCT 10 07/25/2016   MONOPCT 5.6 06/26/2009   EOSPCT 1 07/25/2016   EOSPCT 3.0 06/26/2009   BASOPCT 0 07/25/2016   BASOPCT 0.8 06/26/2009    CMP: Lab Results  Component Value Date   NA 138 07/28/2016   K 3.6 07/28/2016   CL 111 07/28/2016   CO2 20 (L) 07/28/2016   BUN 37 (H) 07/28/2016   CREATININE 1.99 (H) 07/28/2016   PROT 5.0 (L) 07/26/2016   ALBUMIN 2.2 (L) 07/26/2016   BILITOT 1.7 (H) 07/26/2016   ALKPHOS 133 (H) 07/26/2016   AST 28 07/26/2016   ALT 19 07/26/2016  .   Total Time in preparing paper work, data evaluation and todays exam - 35 minutes  Leroy Sea M.D on 07/28/2016 at 9:49 AM  [facility]   Office  [phone]

## 2016-07-28 NOTE — Discharge Instructions (Signed)
Follow with Primary MD HAGUE, Myrene GalasIMRAN P, MD in 3 days and your GI MD in 1 week  Get CBC, CMP, Ammonia, checked  by Primary MD  in 3 days ( we routinely change or add medications that can affect your baseline labs and fluid status, therefore we recommend that you get the mentioned basic workup next visit with your PCP, your PCP may decide not to get them or add new tests based on their clinical decision)  Activity: As tolerated with Full fall precautions use walker/cane & assistance as needed  Disposition Home    Diet:   Diet heart healthy/carb modified.   Check your Weight same time everyday, if you gain over 2 pounds, or you develop in leg swelling, experience more shortness of breath or chest pain, call your Primary MD immediately. Follow Cardiac Low Salt Diet and 1.5 lit/day fluid restriction.  On your next visit with your primary care physician please Get Medicines reviewed and adjusted.  Please request your Prim.MD to go over all Hospital Tests and Procedure/Radiological results at the follow up, please get all Hospital records sent to your Prim MD by signing hospital release before you go home.  If you experience worsening of your admission symptoms, develop shortness of breath, life threatening emergency, suicidal or homicidal thoughts you must seek medical attention immediately by calling 911 or calling your MD immediately  if symptoms less severe.  You Must read complete instructions/literature along with all the possible adverse reactions/side effects for all the Medicines you take and that have been prescribed to you. Take any new Medicines after you have completely understood and accpet all the possible adverse reactions/side effects.   Do not drive, operate heavy machinery, perform activities at heights, swimming or participation in water activities or provide baby sitting services if your were admitted for syncope or siezures until you have seen by Primary MD or a Neurologist and  advised to do so again.  Do not drive when taking Pain medications.    Do not take more than prescribed Pain, Sleep and Anxiety Medications  Special Instructions: If you have smoked or chewed Tobacco  in the last 2 yrs please stop smoking, stop any regular Alcohol  and or any Recreational drug use.  Wear Seat belts while driving.   Please note  You were cared for by a hospitalist during your hospital stay. If you have any questions about your discharge medications or the care you received while you were in the hospital after you are discharged, you can call the unit and asked to speak with the hospitalist on call if the hospitalist that took care of you is not available. Once you are discharged, your primary care physician will handle any further medical issues. Please note that NO REFILLS for any discharge medications will be authorized once you are discharged, as it is imperative that you return to your primary care physician (or establish a relationship with a primary care physician if you do not have one) for your aftercare needs so that they can reassess your need for medications and monitor your lab values.

## 2016-07-28 NOTE — Progress Notes (Signed)
Inpatient Diabetes Program Recommendations  AACE/ADA: New Consensus Statement on Inpatient Glycemic Control (2015)  Target Ranges:  Prepandial:   less than 140 mg/dL      Peak postprandial:   less than 180 mg/dL (1-2 hours)      Critically ill patients:  140 - 180 mg/dL   Lab Results  Component Value Date   GLUCAP 135 (H) 07/28/2016   HGBA1C 9.9 (H) 07/25/2016    Review of Glycemic ControlResults for LAWAN, NANEZ (MRN [id number]) as of 07/28/2016 09:13  Ref. Range 07/27/2016 08:27 07/27/2016 12:13 07/27/2016 17:50 07/27/2016 22:25 07/28/2016 08:42  Glucose-Capillary Latest Ref Range: 65 - 99 mg/dL 132 (H) 440 (H) 102 (H) 216 (H) 135 (H)    Diabetes history: Type 2 diabetes Outpatient Diabetes medications: Lantus 20-24 units bid, Novolog 20 units tid with meals  Current orders for Inpatient glycemic control:  Lantus 25 units bid, Novolog sensitive tid with meals   Inpatient Diabetes Program Recommendations:    Please consider adding Novolog meal coverage 5 units tid with meals while patient is in the hospital.  Thanks, Beryl Meager, RN, BC-ADM Inpatient Diabetes Coordinator Pager [phone] (8a-5p)

## 2016-07-28 NOTE — Care Management Note (Signed)
Case Management Note  Patient Details  Name: Monica [name] MRN: [id number] Date of Birth: 16-Jun-1950  Subjective/Objective:     Admitted with hepatic encephalopathy.               Action/Plan: Plan is to dc/ to home today with home health services. Pt with medicaid, XIFAXAN prescription will be covered @ a  $3 - 4.00. Cost. CM spoke with [facility], and was informed medication is in stock and pt's cost with medicaid. Pt made aware per CM.  Expected Discharge Date:  07/28/16               Expected Discharge Plan:  Home w Home Health Services  In-House Referral:     Discharge planning Services  CM Consult  Post Acute Care Choice:    Choice offered to:  Patient  DME Arranged:    DME Agency:     HH Arranged:  RN, PT, Nurse's Aide HH Agency:  [facility], [facility] to [facility] [place] @ [phone] per CM.  Status of Service:  Completed, signed off  If discussed at Long Length of Stay Meetings, dates discussed:    Additional Comments:  Epifanio LeschesCole, Jeannene Tschetter Hudson, RN 07/28/2016, 11:15 AM

## 2016-07-28 NOTE — Progress Notes (Signed)
Discharge instructions (including medications) discussed with and copy provided to patient/caregiver 

## 2016-07-28 NOTE — Progress Notes (Signed)
Mel Almondiane Y Lemieux to be D/C'd Home per MD order.  Discussed with the patient and all questions fully answered.  Patient escorted via WC, and D/C home via private auto.  Grayling Congressvan J Ariell Gunnels 07/28/2016 12:19 PM

## 2016-08-07 ENCOUNTER — Ambulatory Visit: Payer: Medicare Other | Admitting: Physician Assistant

## 2016-08-12 ENCOUNTER — Encounter (HOSPITAL_COMMUNITY): Payer: Self-pay | Admitting: Emergency Medicine

## 2016-08-12 ENCOUNTER — Emergency Department (HOSPITAL_COMMUNITY): Payer: Medicare Other

## 2016-08-12 ENCOUNTER — Inpatient Hospital Stay (HOSPITAL_COMMUNITY): Payer: Medicare Other

## 2016-08-12 ENCOUNTER — Inpatient Hospital Stay (HOSPITAL_COMMUNITY)
Admission: EM | Admit: 2016-08-12 | Discharge: 2016-08-18 | DRG: 441 | Disposition: A | Discharge status: Home Health | Payer: Medicare Other | Attending: Internal Medicine | Admitting: Internal Medicine

## 2016-08-12 DIAGNOSIS — I8500 Esophageal varices without bleeding: Secondary | ICD-10-CM | POA: Diagnosis present

## 2016-08-12 DIAGNOSIS — K7290 Hepatic failure, unspecified without coma: Secondary | ICD-10-CM | POA: Diagnosis not present

## 2016-08-12 DIAGNOSIS — R402432 Glasgow coma scale score 3-8, at arrival to emergency department: Secondary | ICD-10-CM

## 2016-08-12 DIAGNOSIS — G4733 Obstructive sleep apnea (adult) (pediatric): Secondary | ICD-10-CM | POA: Diagnosis present

## 2016-08-12 DIAGNOSIS — E1142 Type 2 diabetes mellitus with diabetic polyneuropathy: Secondary | ICD-10-CM | POA: Diagnosis present

## 2016-08-12 DIAGNOSIS — G9340 Encephalopathy, unspecified: Secondary | ICD-10-CM | POA: Diagnosis not present

## 2016-08-12 DIAGNOSIS — K7682 Hepatic encephalopathy: Secondary | ICD-10-CM | POA: Diagnosis present

## 2016-08-12 DIAGNOSIS — E1165 Type 2 diabetes mellitus with hyperglycemia: Secondary | ICD-10-CM | POA: Diagnosis present

## 2016-08-12 DIAGNOSIS — K7200 Acute and subacute hepatic failure without coma: Secondary | ICD-10-CM | POA: Diagnosis present

## 2016-08-12 DIAGNOSIS — H3530 Unspecified macular degeneration: Secondary | ICD-10-CM | POA: Diagnosis present

## 2016-08-12 DIAGNOSIS — E8770 Fluid overload, unspecified: Secondary | ICD-10-CM | POA: Diagnosis not present

## 2016-08-12 DIAGNOSIS — J9600 Acute respiratory failure, unspecified whether with hypoxia or hypercapnia: Secondary | ICD-10-CM | POA: Diagnosis not present

## 2016-08-12 DIAGNOSIS — I2510 Atherosclerotic heart disease of native coronary artery without angina pectoris: Secondary | ICD-10-CM | POA: Diagnosis present

## 2016-08-12 DIAGNOSIS — I129 Hypertensive chronic kidney disease with stage 1 through stage 4 chronic kidney disease, or unspecified chronic kidney disease: Secondary | ICD-10-CM | POA: Diagnosis present

## 2016-08-12 DIAGNOSIS — K7581 Nonalcoholic steatohepatitis (NASH): Secondary | ICD-10-CM | POA: Diagnosis present

## 2016-08-12 DIAGNOSIS — E785 Hyperlipidemia, unspecified: Secondary | ICD-10-CM | POA: Diagnosis present

## 2016-08-12 DIAGNOSIS — Z888 Allergy status to other drugs, medicaments and biological substances status: Secondary | ICD-10-CM

## 2016-08-12 DIAGNOSIS — N183 Chronic kidney disease, stage 3 (moderate): Secondary | ICD-10-CM | POA: Diagnosis present

## 2016-08-12 DIAGNOSIS — K589 Irritable bowel syndrome without diarrhea: Secondary | ICD-10-CM | POA: Diagnosis present

## 2016-08-12 DIAGNOSIS — K219 Gastro-esophageal reflux disease without esophagitis: Secondary | ICD-10-CM | POA: Diagnosis present

## 2016-08-12 DIAGNOSIS — Z79899 Other long term (current) drug therapy: Secondary | ICD-10-CM

## 2016-08-12 DIAGNOSIS — Z833 Family history of diabetes mellitus: Secondary | ICD-10-CM

## 2016-08-12 DIAGNOSIS — E1122 Type 2 diabetes mellitus with diabetic chronic kidney disease: Secondary | ICD-10-CM | POA: Diagnosis present

## 2016-08-12 DIAGNOSIS — Z880 Allergy status to penicillin: Secondary | ICD-10-CM

## 2016-08-12 DIAGNOSIS — K7460 Unspecified cirrhosis of liver: Secondary | ICD-10-CM | POA: Diagnosis present

## 2016-08-12 DIAGNOSIS — F17210 Nicotine dependence, cigarettes, uncomplicated: Secondary | ICD-10-CM | POA: Diagnosis present

## 2016-08-12 DIAGNOSIS — Z6841 Body Mass Index (BMI) 40.0 and over, adult: Secondary | ICD-10-CM | POA: Diagnosis not present

## 2016-08-12 DIAGNOSIS — Z4659 Encounter for fitting and adjustment of other gastrointestinal appliance and device: Secondary | ICD-10-CM | POA: Diagnosis not present

## 2016-08-12 DIAGNOSIS — R609 Edema, unspecified: Secondary | ICD-10-CM | POA: Diagnosis not present

## 2016-08-12 DIAGNOSIS — Z9049 Acquired absence of other specified parts of digestive tract: Secondary | ICD-10-CM

## 2016-08-12 DIAGNOSIS — Z781 Physical restraint status: Secondary | ICD-10-CM

## 2016-08-12 DIAGNOSIS — R188 Other ascites: Secondary | ICD-10-CM | POA: Diagnosis present

## 2016-08-12 DIAGNOSIS — R600 Localized edema: Secondary | ICD-10-CM

## 2016-08-12 DIAGNOSIS — K449 Diaphragmatic hernia without obstruction or gangrene: Secondary | ICD-10-CM | POA: Diagnosis present

## 2016-08-12 DIAGNOSIS — E1151 Type 2 diabetes mellitus with diabetic peripheral angiopathy without gangrene: Secondary | ICD-10-CM | POA: Diagnosis present

## 2016-08-12 DIAGNOSIS — Z882 Allergy status to sulfonamides status: Secondary | ICD-10-CM

## 2016-08-12 DIAGNOSIS — J189 Pneumonia, unspecified organism: Secondary | ICD-10-CM | POA: Diagnosis not present

## 2016-08-12 DIAGNOSIS — Z716 Tobacco abuse counseling: Secondary | ICD-10-CM

## 2016-08-12 DIAGNOSIS — Z800 Family history of malignant neoplasm of digestive organs: Secondary | ICD-10-CM

## 2016-08-12 DIAGNOSIS — E039 Hypothyroidism, unspecified: Secondary | ICD-10-CM | POA: Diagnosis present

## 2016-08-12 DIAGNOSIS — Z794 Long term (current) use of insulin: Secondary | ICD-10-CM

## 2016-08-12 DIAGNOSIS — R34 Anuria and oliguria: Secondary | ICD-10-CM | POA: Diagnosis not present

## 2016-08-12 DIAGNOSIS — J14 Pneumonia due to Hemophilus influenzae: Secondary | ICD-10-CM | POA: Diagnosis not present

## 2016-08-12 DIAGNOSIS — D696 Thrombocytopenia, unspecified: Secondary | ICD-10-CM | POA: Diagnosis present

## 2016-08-12 DIAGNOSIS — Z881 Allergy status to other antibiotic agents status: Secondary | ICD-10-CM

## 2016-08-12 DIAGNOSIS — Z7902 Long term (current) use of antithrombotics/antiplatelets: Secondary | ICD-10-CM

## 2016-08-12 DIAGNOSIS — Z91040 Latex allergy status: Secondary | ICD-10-CM

## 2016-08-12 DIAGNOSIS — J9601 Acute respiratory failure with hypoxia: Secondary | ICD-10-CM | POA: Diagnosis present

## 2016-08-12 DIAGNOSIS — K7469 Other cirrhosis of liver: Secondary | ICD-10-CM | POA: Diagnosis not present

## 2016-08-12 DIAGNOSIS — Z22322 Carrier or suspected carrier of Methicillin resistant Staphylococcus aureus: Secondary | ICD-10-CM

## 2016-08-12 DIAGNOSIS — Z883 Allergy status to other anti-infective agents status: Secondary | ICD-10-CM

## 2016-08-12 DIAGNOSIS — D631 Anemia in chronic kidney disease: Secondary | ICD-10-CM | POA: Diagnosis present

## 2016-08-12 LAB — URINALYSIS, ROUTINE W REFLEX MICROSCOPIC
Bilirubin Urine: NEGATIVE
Glucose, UA: NEGATIVE mg/dL
Hgb urine dipstick: NEGATIVE
Ketones, ur: NEGATIVE mg/dL
Leukocytes, UA: NEGATIVE
Nitrite: NEGATIVE
Protein, ur: NEGATIVE mg/dL
Specific Gravity, Urine: 1.012 (ref 1.005–1.030)
pH: 6 (ref 5.0–8.0)

## 2016-08-12 LAB — I-STAT ARTERIAL BLOOD GAS, ED
ACID-BASE DEFICIT: 5 mmol/L — AB (ref 0.0–2.0)
BICARBONATE: 20.9 mmol/L (ref 20.0–28.0)
O2 SAT: 100 %
PH ART: 7.321 — AB (ref 7.350–7.450)
PO2 ART: 385 mmHg — AB (ref 83.0–108.0)
TCO2: 22 mmol/L (ref 0–100)
pCO2 arterial: 40 mmHg (ref 32.0–48.0)

## 2016-08-12 LAB — CBC
HCT: 31.7 % — ABNORMAL LOW (ref 36.0–46.0)
Hemoglobin: 10.2 g/dL — ABNORMAL LOW (ref 12.0–15.0)
MCH: 26.6 pg (ref 26.0–34.0)
MCHC: 32.2 g/dL (ref 30.0–36.0)
MCV: 82.6 fL (ref 78.0–100.0)
Platelets: 107 10*3/uL — ABNORMAL LOW (ref 150–400)
RBC: 3.84 MIL/uL — ABNORMAL LOW (ref 3.87–5.11)
RDW: 17.1 % — ABNORMAL HIGH (ref 11.5–15.5)
WBC: 9.1 10*3/uL (ref 4.0–10.5)

## 2016-08-12 LAB — TSH: TSH: 1.928 u[IU]/mL (ref 0.350–4.500)

## 2016-08-12 LAB — COMPREHENSIVE METABOLIC PANEL
ALT: 19 U/L (ref 14–54)
AST: 25 U/L (ref 15–41)
Albumin: 2.4 g/dL — ABNORMAL LOW (ref 3.5–5.0)
Alkaline Phosphatase: 111 U/L (ref 38–126)
Anion gap: 10 (ref 5–15)
BUN: 28 mg/dL — ABNORMAL HIGH (ref 6–20)
CO2: 19 mmol/L — ABNORMAL LOW (ref 22–32)
Calcium: 7.6 mg/dL — ABNORMAL LOW (ref 8.9–10.3)
Chloride: 109 mmol/L (ref 101–111)
Creatinine, Ser: 2.12 mg/dL — ABNORMAL HIGH (ref 0.44–1.00)
GFR calc Af Amer: 27 mL/min — ABNORMAL LOW (ref >60)
GFR calc non Af Amer: 23 mL/min — ABNORMAL LOW (ref >60)
Glucose, Bld: 225 mg/dL — ABNORMAL HIGH (ref 65–99)
Potassium: 4.4 mmol/L (ref 3.5–5.1)
Sodium: 138 mmol/L (ref 135–145)
Total Bilirubin: 0.9 mg/dL (ref 0.3–1.2)
Total Protein: 5.2 g/dL — ABNORMAL LOW (ref 6.5–8.1)

## 2016-08-12 LAB — TRIGLYCERIDES: Triglycerides: 107 mg/dL (ref <150)

## 2016-08-12 LAB — RAPID URINE DRUG SCREEN, HOSP PERFORMED
Amphetamines: NOT DETECTED
Barbiturates: NOT DETECTED
Benzodiazepines: POSITIVE — AB
Cocaine: NOT DETECTED
Opiates: NOT DETECTED
Tetrahydrocannabinol: NOT DETECTED

## 2016-08-12 LAB — TROPONIN I: Troponin I: <0.03 ng/mL (ref <0.03)

## 2016-08-12 LAB — PROTIME-INR
INR: 1.5
Prothrombin Time: 18.3 seconds — ABNORMAL HIGH (ref 11.4–15.2)

## 2016-08-12 LAB — GLUCOSE, CAPILLARY
GLUCOSE-CAPILLARY: 230 mg/dL — AB (ref 65–99)
GLUCOSE-CAPILLARY: 290 mg/dL — AB (ref 65–99)
Glucose-Capillary: 222 mg/dL — ABNORMAL HIGH (ref 65–99)

## 2016-08-12 LAB — CBG MONITORING, ED: GLUCOSE-CAPILLARY: 226 mg/dL — AB (ref 65–99)

## 2016-08-12 LAB — SALICYLATE LEVEL

## 2016-08-12 LAB — PROCALCITONIN

## 2016-08-12 LAB — MAGNESIUM
Magnesium: 0.8 mg/dL — CL (ref 1.7–2.4)
Magnesium: 1.2 mg/dL — ABNORMAL LOW (ref 1.7–2.4)

## 2016-08-12 LAB — AMMONIA: Ammonia: 171 umol/L — ABNORMAL HIGH (ref 9–35)

## 2016-08-12 MED ORDER — ROCURONIUM BROMIDE 50 MG/5ML IV SOLN
INTRAVENOUS | Status: DC | PRN
  Administered 2016-08-12: 100 mg via INTRAVENOUS

## 2016-08-12 MED ORDER — LEVOTHYROXINE SODIUM 75 MCG PO TABS: 75.0000 ug | ORAL_TABLET | Freq: Every day | ORAL | Status: DC

## 2016-08-12 MED ORDER — SODIUM CHLORIDE 0.9 % IV SOLN: 250.0000 mL | INTRAVENOUS | Status: DC | PRN

## 2016-08-12 MED ORDER — PROPOFOL 1000 MG/100ML IV EMUL
5.0000 ug/kg/min | Freq: Once | INTRAVENOUS | Status: DC
  Administered 2016-08-12: 5 ug/kg/min via INTRAVENOUS

## 2016-08-12 MED ORDER — FENTANYL CITRATE (PF) 100 MCG/2ML IJ SOLN
50.0000 ug | INTRAMUSCULAR | Status: DC | PRN
  Administered 2016-08-13 – 2016-08-14 (×2): 50 ug via INTRAVENOUS
  Filled 2016-08-12 (×2): qty 2

## 2016-08-12 MED ORDER — ENOXAPARIN SODIUM 30 MG/0.3ML ~~LOC~~ SOLN
30.0000 mg | SUBCUTANEOUS | Status: DC
  Administered 2016-08-12 – 2016-08-14 (×3): 30 mg via SUBCUTANEOUS
  Filled 2016-08-12 (×5): qty 0.3

## 2016-08-12 MED ORDER — MAGNESIUM SULFATE 2 GM/50ML IV SOLN
2.0000 g | Freq: Once | INTRAVENOUS | Status: AC
  Administered 2016-08-12: 2 g via INTRAVENOUS
  Filled 2016-08-12: qty 50

## 2016-08-12 MED ORDER — PROPRANOLOL HCL 60 MG PO TABS: 60.0000 mg | ORAL_TABLET | Freq: Every day | ORAL | Status: DC

## 2016-08-12 MED ORDER — FENTANYL CITRATE (PF) 100 MCG/2ML IJ SOLN
50.0000 ug | INTRAMUSCULAR | Status: AC | PRN
  Administered 2016-08-12 – 2016-08-13 (×3): 50 ug via INTRAVENOUS
  Filled 2016-08-12 (×3): qty 2

## 2016-08-12 MED ORDER — LACTULOSE 10 GM/15ML PO SOLN: 30.0000 g | Freq: Two times a day (BID) | ORAL | Status: DC

## 2016-08-12 MED ORDER — RIFAXIMIN 200 MG PO TABS
200.0000 mg | ORAL_TABLET | Freq: Three times a day (TID) | ORAL | Status: DC
  Administered 2016-08-12 – 2016-08-13 (×3): 200 mg
  Filled 2016-08-12 (×3): qty 1

## 2016-08-12 MED ORDER — ORAL CARE MOUTH RINSE
15.0000 mL | Freq: Four times a day (QID) | OROMUCOSAL | Status: DC
  Administered 2016-08-13 – 2016-08-14 (×8): 15 mL via OROMUCOSAL

## 2016-08-12 MED ORDER — LEVOTHYROXINE SODIUM 75 MCG PO TABS
75.0000 ug | ORAL_TABLET | Freq: Every day | ORAL | Status: DC
  Administered 2016-08-13 – 2016-08-18 (×6): 75 ug via ORAL
  Filled 2016-08-12 (×6): qty 1

## 2016-08-12 MED ORDER — RIFAXIMIN 200 MG PO TABS: 200.0000 mg | ORAL_TABLET | Freq: Three times a day (TID) | ORAL | Status: DC

## 2016-08-12 MED ORDER — CLOPIDOGREL BISULFATE 75 MG PO TABS
75.0000 mg | ORAL_TABLET | Freq: Every day | ORAL | Status: DC
  Administered 2016-08-12 – 2016-08-18 (×7): 75 mg
  Filled 2016-08-12 (×7): qty 1

## 2016-08-12 MED ORDER — SODIUM CHLORIDE 0.9 % IV SOLN
Freq: Once | INTRAVENOUS | Status: AC
Start: 2016-08-12 — End: 2016-08-12
  Administered 2016-08-12: 12:00:00 via INTRAVENOUS

## 2016-08-12 MED ORDER — PROPOFOL 1000 MG/100ML IV EMUL
0.0000 ug/kg/min | INTRAVENOUS | Status: DC
  Administered 2016-08-12: 5 ug/kg/min via INTRAVENOUS
  Administered 2016-08-13 – 2016-08-14 (×3): 20 ug/kg/min via INTRAVENOUS
  Filled 2016-08-12 (×3): qty 100

## 2016-08-12 MED ORDER — CHLORHEXIDINE GLUCONATE 0.12% ORAL RINSE (MEDLINE KIT)
15.0000 mL | Freq: Two times a day (BID) | OROMUCOSAL | Status: DC
  Administered 2016-08-12 – 2016-08-15 (×6): 15 mL via OROMUCOSAL

## 2016-08-12 MED ORDER — LACTULOSE 10 GM/15ML PO SOLN
30.0000 g | Freq: Two times a day (BID) | ORAL | Status: DC
  Administered 2016-08-12 – 2016-08-14 (×4): 30 g
  Filled 2016-08-12 (×4): qty 45

## 2016-08-12 MED ORDER — ONDANSETRON HCL 4 MG/2ML IJ SOLN: 4.0000 mg | Freq: Four times a day (QID) | INTRAMUSCULAR | Status: DC | PRN

## 2016-08-12 MED ORDER — PROPOFOL 1000 MG/100ML IV EMUL
5.0000 ug/kg/min | Freq: Once | INTRAVENOUS | Status: AC
  Administered 2016-08-12: 5 ug/kg/min via INTRAVENOUS

## 2016-08-12 MED ORDER — SODIUM POLYSTYRENE SULFONATE 15 GM/60ML PO SUSP
60.0000 g | Freq: Once | ORAL | Status: DC
  Filled 2016-08-12: qty 240

## 2016-08-12 MED ORDER — PANTOPRAZOLE SODIUM 40 MG PO PACK
40.0000 mg | PACK | Freq: Every day | ORAL | Status: DC
  Administered 2016-08-12 – 2016-08-14 (×3): 40 mg
  Filled 2016-08-12 (×4): qty 20

## 2016-08-12 MED ORDER — INSULIN ASPART 100 UNIT/ML ~~LOC~~ SOLN
0.0000 [IU] | SUBCUTANEOUS | Status: DC
  Administered 2016-08-12 (×2): 5 [IU] via SUBCUTANEOUS
  Administered 2016-08-13: 8 [IU] via SUBCUTANEOUS
  Administered 2016-08-13 (×2): 5 [IU] via SUBCUTANEOUS
  Administered 2016-08-13: 8 [IU] via SUBCUTANEOUS
  Administered 2016-08-13 (×3): 5 [IU] via SUBCUTANEOUS
  Administered 2016-08-14: 8 [IU] via SUBCUTANEOUS
  Administered 2016-08-14: 5 [IU] via SUBCUTANEOUS
  Administered 2016-08-14: 3 [IU] via SUBCUTANEOUS
  Administered 2016-08-14 (×2): 5 [IU] via SUBCUTANEOUS
  Administered 2016-08-14: 8 [IU] via SUBCUTANEOUS
  Administered 2016-08-15 (×4): 3 [IU] via SUBCUTANEOUS

## 2016-08-12 MED ORDER — PROPRANOLOL HCL 60 MG PO TABS
60.0000 mg | ORAL_TABLET | Freq: Every day | ORAL | Status: DC
  Administered 2016-08-12 – 2016-08-18 (×7): 60 mg
  Filled 2016-08-12 (×8): qty 1

## 2016-08-12 MED ORDER — LEVOTHYROXINE SODIUM 100 MCG IV SOLR: 37.5000 ug | Freq: Every day | INTRAVENOUS | Status: DC

## 2016-08-12 MED ORDER — ETOMIDATE 2 MG/ML IV SOLN
INTRAVENOUS | Status: DC | PRN
  Administered 2016-08-12: 30 mg via INTRAVENOUS

## 2016-08-12 MED ORDER — LACTULOSE 10 GM/15ML PO SOLN
20.0000 g | Freq: Once | ORAL | Status: DC
  Filled 2016-08-12: qty 30

## 2016-08-12 MED ORDER — SODIUM CHLORIDE 0.9 % IV SOLN
INTRAVENOUS | Status: DC
  Administered 2016-08-12: 13:00:00 via INTRAVENOUS
  Administered 2016-08-12: 100 mL/h via INTRAVENOUS

## 2016-08-12 NOTE — H&P (Addendum)
PULMONARY / CRITICAL CARE MEDICINE   Name: Monica [name] MRN: [id number] DOB: 1951-02-21    ADMISSION DATE:  08/12/2016 CONSULTATION DATE:  08/12/16  REFERRING MD:  Juleen ChinaKohut  CHIEF COMPLAINT:  AMS  HISTORY OF PRESENT ILLNESS:  Pt is encephelopathic; therefore, this HPI is obtained from chart review. Monica [name] is a 66 y.o. female with PMH as outlined below. [facility] after being found in her bed unresponsive by husband around 11AM that morning.  In ED, she remained essentially unresponsive and subsequently required intubation for airway protection.  Ammonia elevated at 171, AoCKD (SCr 2.12), Mag 0.8, Osmolality 305, UDS positive for benzo's, CT head negative. Recent admission 3/9-3/12 for same issue. Per husband and daughter, has been compliant with lactulose  PAST MEDICAL HISTORY :  She  has a past medical history of Bilateral lower extremity edema; Carotid artery bruit; Chest pain (09/02/2012); Coronary atherosclerosis of unspecified type of vessel, native or graft; Endometrial polyp; Esophageal varices (HCC); GERD (gastroesophageal reflux disease); Hiatal hernia; Hyperlipidemia; Hypertension; Iron deficiency anemia; Irritable bowel syndrome; Macular degeneration (senile) of retina, unspecified; Obesity, unspecified; Osteoarthrosis, unspecified whether generalized or localized, unspecified site; PVD (peripheral vascular disease) (HCC); SOB (shortness of breath); Tobacco abuse; Type II or unspecified type diabetes mellitus without mention of complication, not stated as uncontrolled; and Unspecified sleep apnea.  PAST SURGICAL HISTORY: She  has a past surgical history that includes Appendectomy; Cholecystectomy; Carotid endarterectomy; Cardiac catheterization (03/18/2007); Cardiac catheterization (07/01/2001); and Cardiac catheterization (05/17/1997).  Allergies  Allergen Reactions  . Augmentin [Amoxicillin-Pot Clavulanate]     Unknown   . Biaxin [Clarithromycin]      Unknown  . Invokana [Canagliflozin]     Unknown  . Latex     Unknown  . Sulfonamide Derivatives   . Penicillins Rash    Has patient had a PCN reaction causing immediate rash, facial/tongue/throat swelling, SOB or lightheadedness with hypotension:YES Has patient had a PCN reaction causing severe rash involving mucus membranes or skin necrosis: NO Has patient had a PCN reaction that required hospitalizationNO Has patient had a PCN reaction occurring within the last 10 years: NO If all of the above answers are "NO", then may proceed with Cephalosporin use.    Current Facility-Administered Medications on File Prior to Encounter  Medication  . 0.9 %  sodium chloride infusion   Current Outpatient Prescriptions on File Prior to Encounter  Medication Sig  . ALPRAZolam (XANAX) 1 MG tablet Take 1 mg by mouth 2 (two) times daily as needed.   . clopidogrel (PLAVIX) 75 MG tablet Take 75 mg by mouth daily.    . furosemide (LASIX) 40 MG tablet Take 1 tablet (40 mg total) by mouth daily. <PLEASE MAKE APPOINTMENT FOR REFILLS>  . gabapentin (NEURONTIN) 300 MG capsule Take 1 capsule (300 mg total) by mouth 3 (three) times daily.  . isosorbide mononitrate (ISMO,MONOKET) 20 MG tablet Take 20 mg by mouth daily.   Marland Kitchen. lactulose (CHRONULAC) 10 GM/15ML solution Take 30 mLs (20 g total) by mouth 2 (two) times daily.  Marland Kitchen. LANTUS SOLOSTAR 100 UNIT/ML Solostar Pen INJECT 20 TO 24 UNITS TWICE DAILY  . levothyroxine (SYNTHROID, LEVOTHROID) 75 MCG tablet Take 75 mcg by mouth daily. Brand Name Only  . NOVOLOG FLEXPEN 100 UNIT/ML FlexPen INJECT 20 UNITS INTO THE SKIN 3 TIMES DAILY WITH MEALS  . pantoprazole (PROTONIX) 40 MG tablet Take 40 mg by mouth daily.   . propranolol (INDERAL) 60 MG tablet Take 60 mg by  mouth daily.   . rifaximin (XIFAXAN) 550 MG TABS tablet Take 1 tablet (550 mg total) by mouth 2 (two) times daily.  . simvastatin (ZOCOR) 40 MG tablet Take 40 mg by mouth. Three times weekly  . spironolactone  (ALDACTONE) 100 MG tablet Take 1 tablet (100 mg total) by mouth daily.  . VENTOLIN HFA 108 (90 BASE) MCG/ACT inhaler Inhale 2 puffs into the lungs every 6 (six) hours as needed for shortness of breath.     FAMILY HISTORY:  Her indicated that the status of her mother is unknown. She indicated that the status of her father is unknown. She indicated that the status of her sister is unknown. She indicated that the status of her brother is unknown.    SOCIAL HISTORY: She  reports that she has been smoking Cigarettes.  She has been smoking about 1.00 pack per day. She has never used smokeless tobacco. She reports that she does not drink alcohol or use drugs.  REVIEW OF SYSTEMS:  Unable to obtain as pt is encephalopathic.  SUBJECTIVE:  On vent, unresponsive.  VITAL SIGNS: BP (!) 151/69   Pulse 71   Temp (!) 96.8 F (36 C) (Rectal)   Resp 18   Wt 108.5 kg (239 lb 3.2 oz)   SpO2 100%   BMI 41.06 kg/m   HEMODYNAMICS:    VENTILATOR SETTINGS: Vent Mode: PRVC FiO2 (%):  [50 %] 50 % Set Rate:  [18 bmp] 18 bmp Vt Set:  [430 mL] 430 mL PEEP:  [5 cmH20] 5 cmH20 Plateau Pressure:  [20 cmH20] 20 cmH20  INTAKE / OUTPUT: No intake/output data recorded.   PHYSICAL EXAMINATION: Gen. , obese, in no distress, intubated ENT - ETT oral, no post nasal drip Neck: No JVD, no thyromegaly, no carotid bruits Lungs: no use of accessory muscles, no dullness to percussion, decreased without rales or rhonchi  Cardiovascular: Rhythm regular, heart sounds  normal, no murmurs or gallops, no peripheral edema Abdomen: soft and non-tender, no hepatosplenomegaly, BS normal. Musculoskeletal: No deformities, no cyanosis or clubbing Neuro:sedated on propofol gtt, non focal, no tremors   LABS:  BMET  Recent Labs Lab 08/12/16 1154  NA 138  K 4.4  CL 109  CO2 19*  BUN 28*  CREATININE 2.12*  GLUCOSE 225*    Electrolytes  Recent Labs Lab 08/12/16 1154  CALCIUM 7.6*  MG 0.8*    CBC  Recent  Labs Lab 08/12/16 1154  WBC 9.1  HGB 10.2*  HCT 31.7*  PLT 107*    Coag's  Recent Labs Lab 08/12/16 1154  INR 1.50    Sepsis Markers No results for input(s): LATICACIDVEN, PROCALCITON, O2SATVEN in the last 168 hours.  ABG  Recent Labs Lab 08/12/16 1348  PHART 7.321*  PCO2ART 40.0  PO2ART 385.0*    Liver Enzymes  Recent Labs Lab 08/12/16 1154  AST 25  ALT 19  ALKPHOS 111  BILITOT 0.9  ALBUMIN 2.4*    Cardiac Enzymes  Recent Labs Lab 08/12/16 1154  TROPONINI <0.03    Glucose  Recent Labs Lab 08/12/16 1145  GLUCAP 226*    Imaging Ct Head Wo Contrast  Result Date: 08/12/2016 CLINICAL DATA:  66 year old female found unresponsive this morning, intubated. Initial encounter. EXAM: CT HEAD WITHOUT CONTRAST TECHNIQUE: Contiguous axial images were obtained from the base of the skull through the vertex without intravenous contrast. COMPARISON:  Brain MRI 07/25/2016 and earlier. FINDINGS: Brain: Normal for age cerebral volume. No midline shift, ventriculomegaly, mass effect, evidence of  mass lesion, intracranial hemorrhage or evidence of cortically based acute infarction. Gray-white matter differentiation is within normal limits throughout the brain. Vascular: Calcified atherosclerosis at the skull base. No suspicious intracranial vascular hyperdensity. Skull: Intact.  No acute osseous abnormality identified. Sinuses/Orbits: Clear. Fluid in the pharynx and posterior nasal cavity in the setting of intubation. Partially visible oral enteric tube. Other: Visualized orbits and scalp soft tissues are within normal limits. IMPRESSION: 1. Stable and normal for age non contrast CT appearance of the brain. 2. Fluid in the nasopharynx and posterior nasal cavity in the setting of intubation. Electronically Signed   By: Odessa Fleming M.D.   On: 08/12/2016 13:35   Dg Chest Portable 1 View  Result Date: 08/12/2016 CLINICAL DATA:  Altered mental status.  Unresponsive.  Intubated. EXAM:  PORTABLE CHEST 1 VIEW COMPARISON:  07/25/2016 FINDINGS: Endotracheal tube tip is 3 cm above the carina. Nasogastric tube enters the stomach. The lungs are clear. No effusions. No acute bone finding. IMPRESSION: Endotracheal tube and nasogastric tube well positioned. No active disease. Electronically Signed   By: Paulina Fusi M.D.   On: 08/12/2016 12:14     STUDIES:  CXR 3/27 > negative. CT head 3/27 > negative.  CULTURES: Blood 3/27 >  Sputum 3/27 >  Urine 3/27 >   ANTIBIOTICS: None.  SIGNIFICANT EVENTS: 3/27 > admit.  LINES/TUBES: ETT 3/27 >   DISCUSSION: 66 y.o. female admitted 3/27 with AMS after being found in bed unresponsive by husband earlier that morning.  Workup thus far reveals elevated ammonia.  ASSESSMENT / PLAN:  GASTROINTESTINAL A:   Elevated ammonia. Nutrition. Hx cirrhosis with NASH (Followed by Dr. Marina Goodell, last seen 2016), GERD, hiatal hernia, ? esophageal varices, IBS. P:   Continue lactulose and rifaximin per tube. NPO. Continue preadmission PPI, propranolol.  PULMONARY A: VDRF - due to inability to protect the airway in setting of AMS. Hx OSA (unsure of CPAP use). Tobacco dependence. P:   Full vent support. Wean as able. VAP prevention measures. SBT in AM if able. CXR in AM. Tobacco cessation counseling once extubated.  CARDIOVASCULAR A:  Hx HTN, HLD, PVD, CAD. P:  Monitor hemodynamics. Continue preadmission plavix. Hold preadmission simvastatin, isosorbide, spironolactone.  RENAL A:   AoCKD  stg 3 Hypomagnesemia - s/p 2g Mag. Pseudohypocalcemia - corrects to 8.8. P:   NS @ 100. Recheck Mag this PM. Assess ionized calcium. BMP in AM.  HEMATOLOGIC A:   Thrombocytopenia. VTE Prophylaxis. P:  Monitor platelet counts. SCD's / lovenox. CBC in AM.  INFECTIOUS A:   No indication of infection. P:   Monitor clinically.  ENDOCRINE A:   Hx Hypothyroidism, DM. P:   Continue preadmission synthroid, change to IV  form. SSI. Hold preadmission lantus, novolog. Assess TSH, Hgb A1c.  NEUROLOGIC A:   Acute encephalopathy - presumed due to elevated hyperammonemia. P:   Sedation:  Propofol gtt / Fentanyl PRN. RASS goal: 0 to -1. Daily WUA. Continue lactulose / rifaximin as above. Hold preadmission alprazolam, gabapentin.  Family updated: husband & daughter at bedside, guarded prognosis  Interdisciplinary Family Meeting v Palliative Care Meeting:  Due by: 08/17/16.  CC time: 50 min.  Cyril Mourning MD. Tonny Bollman. [facility] Pager [phone] If no response call [phone]   08/12/2016    08/12/2016, 2:11 PM

## 2016-08-12 NOTE — Care Management Note (Signed)
Case Management Note  Patient Details  Name: Monica [name] MRN: [id number] Date of Birth: 1951/01/21  Subjective/Objective:  Pt admitted post being found down in the home by husband                 Action/Plan:   PTA from home.  [facility]  Pt is ventilated with guarded prognosis.  CM will continue to follow for discharge needs   Expected Discharge Date:                  Expected Discharge Plan:     In-House Referral:     Discharge planning Services  CM Consult  Post Acute Care Choice:    Choice offered to:     DME Arranged:    DME Agency:     HH Arranged:    HH Agency:     Status of Service:  In process, will continue to follow  If discussed at Long Length of Stay Meetings, dates discussed:    Additional Comments:  Cherylann ParrClaxton, Devontae Casasola S, RN 08/12/2016, 3:37 PM

## 2016-08-12 NOTE — ED Provider Notes (Signed)
[facility] DEPT Provider Note   CSN: [id number] Arrival date & time: 08/12/16  1138  By signing my name below, I, Evelene Croon, attest that this documentation has been prepared under the direction and in the presence of Virgel Manifold, MD . Electronically Signed: Evelene Croon, Scribe. 08/12/2016. 11:57 AM.  History   Chief Complaint Chief Complaint  Patient presents with  . Altered Mental Status    LEVEL 5 CAVEAT DUE TO ACUITY OF MEDICAL CONDITION   The history is provided by the EMS personnel. No language interpreter was used.    HPI Comments:  Monica [name] is a 66 y.o. obese female with a history of HTN, PVD, DM who presents to the Emergency Department via EMS unresponsive. Pt was found unresponsive by her husband in bed this morning. Unknown downtime and its unknown when she was last seen normal. EMS states pt is currently being treated for high ammonia levels and note field CBG of 350. Duo neb was started en route which resolved wheezing but was discontinued as rales/rhonchi seemed to worsen. Pt also given 125 mg solumedrol and nasal trumpet was placed en route.   Past Medical History:  Diagnosis Date  . Bilateral lower extremity edema   . Carotid artery bruit    Doppler 07/09/2011 - abnormal study, no change from previous  . Chest pain 09/02/2012   Normal, EF-67  . Coronary atherosclerosis of unspecified type of vessel, native or graft   . Endometrial polyp   . Esophageal varices ([place])   . GERD (gastroesophageal reflux disease)   . Hiatal hernia   . Hyperlipidemia   . Hypertension   . Iron deficiency anemia   . Irritable bowel syndrome   . Macular degeneration (senile) of retina, unspecified   . Obesity, unspecified   . Osteoarthrosis, unspecified whether generalized or localized, unspecified site   . PVD (peripheral vascular disease) ([place])    Occluded right internal carotid artery - 50% left ICA stenosis  . SOB (shortness of breath)    Normal 2D echo - EF-58,  03/18/2001  . Tobacco abuse   . Type II or unspecified type diabetes mellitus without mention of complication, not stated as uncontrolled   . Unspecified sleep apnea     Patient Active Problem List   Diagnosis Date Noted  . Hepatic encephalopathy ([place]) 07/25/2016  . Esophageal varices without bleeding ([place]) 03/05/2015  . Leg edema 03/05/2015  . Ascites 03/05/2015  . Special screening for malignant neoplasms, colon 03/05/2015  . Hepatic cirrhosis ([place]) 03/05/2015  . Bilateral carotid artery disease ([place]) 08/15/2014  . Bilateral lower extremity edema 08/15/2014  . Tobacco abuse 08/15/2014  . Cirrhosis of liver without mention of alcohol 03/23/2013  . Other and unspecified hyperlipidemia 02/26/2013  . Polyneuropathy in diabetes(357.2) 02/26/2013  . Hypothyroidism 02/24/2013  . COUGH 10/07/2007  . Type II or unspecified type diabetes mellitus with neurological manifestations, uncontrolled(250.62) 04/08/2007  . Obesity 04/08/2007  . Macular degeneration (senile) of retina 04/08/2007  . Coronary atherosclerosis 04/08/2007  . GERD 04/08/2007  . IRRITABLE BOWEL SYNDROME 04/08/2007  . OSTEOARTHRITIS 04/08/2007  . SLEEP APNEA 04/08/2007    Past Surgical History:  Procedure Laterality Date  . APPENDECTOMY    . CARDIAC CATHETERIZATION  03/18/2007   Normal coronary arteries and LV function  . CARDIAC CATHETERIZATION  07/01/2001   Normal cath  . CARDIAC CATHETERIZATION  05/17/1997   Normal cath  . CAROTID ENDARTERECTOMY    . CHOLECYSTECTOMY      OB History  No data available       Home Medications    Prior to Admission medications   Medication Sig Start Date End Date Taking? Authorizing Provider  ALPRAZolam Duanne Moron) 1 MG tablet Take 1 mg by mouth 2 (two) times daily as needed.     Historical Provider, MD  clopidogrel (PLAVIX) 75 MG tablet Take 75 mg by mouth daily.      Historical Provider, MD  furosemide (LASIX) 40 MG tablet Take 1 tablet (40 mg total) by mouth daily.  <PLEASE MAKE APPOINTMENT FOR REFILLS> 07/30/16   Thurnell Lose, MD  gabapentin (NEURONTIN) 300 MG capsule Take 1 capsule (300 mg total) by mouth 3 (three) times daily. 07/30/16   Thurnell Lose, MD  isosorbide mononitrate (ISMO,MONOKET) 20 MG tablet Take 20 mg by mouth daily.  02/20/15   Historical Provider, MD  lactulose (CHRONULAC) 10 GM/15ML solution Take 30 mLs (20 g total) by mouth 2 (two) times daily. 07/28/16   Thurnell Lose, MD  LANTUS SOLOSTAR 100 UNIT/ML Solostar Pen INJECT 20 TO 24 UNITS TWICE DAILY 06/21/14   Elayne Snare, MD  levothyroxine (SYNTHROID, LEVOTHROID) 75 MCG tablet Take 75 mcg by mouth daily. Brand Name Only    Historical Provider, MD  NOVOLOG FLEXPEN 100 UNIT/ML FlexPen INJECT 20 UNITS INTO THE SKIN 3 TIMES DAILY WITH MEALS 06/21/14   Elayne Snare, MD  pantoprazole (PROTONIX) 40 MG tablet Take 40 mg by mouth daily.  07/18/14   Historical Provider, MD  propranolol (INDERAL) 60 MG tablet Take 60 mg by mouth daily.  02/20/15   Historical Provider, MD  rifaximin (XIFAXAN) 550 MG TABS tablet Take 1 tablet (550 mg total) by mouth 2 (two) times daily. 07/28/16   Thurnell Lose, MD  simvastatin (ZOCOR) 40 MG tablet Take 40 mg by mouth. Three times weekly    Historical Provider, MD  spironolactone (ALDACTONE) 100 MG tablet Take 1 tablet (100 mg total) by mouth daily. 07/28/16   Thurnell Lose, MD  VENTOLIN HFA 108 (90 BASE) MCG/ACT inhaler Inhale 2 puffs into the lungs every 6 (six) hours as needed for shortness of breath.  02/08/15   Historical Provider, MD    Family History Family History  Problem Relation Age of Onset  . Breast cancer Mother   . Diabetes Father   . Breast cancer Sister   . Diabetes Sister   . Diabetes Brother   . Colon cancer Paternal Aunt   . Stomach cancer Paternal Aunt     Social History Social History  Substance Use Topics  . Smoking status: Current Every Day Smoker    Packs/day: 1.00    Types: Cigarettes  . Smokeless tobacco: Never Used      Comment: form given on 05-26-11  . Alcohol use No     Allergies   Augmentin [amoxicillin-pot clavulanate]; Biaxin [clarithromycin]; Invokana [canagliflozin]; Latex; Sulfonamide derivatives; and Penicillins   Review of Systems Review of Systems  Unable to perform ROS: Acuity of condition     Physical Exam Updated Vital Signs BP (!) 158/64   Pulse 86   Temp 97.9 F (36.6 C) (Axillary)   Resp 18   SpO2 100%   Physical Exam  Constitutional: She appears well-developed.  Morbidly obese No outwards signs of trauma   HENT:  Encephalopathic Edentulous    Cardiovascular: Normal rate.   Pulmonary/Chest:  Snoring respirations Frothy sputum from nasal trumpet  Abdominal:  Abdominal surgical scars noted  Neurological:  Non verbal   Not responding  to verbal or physical stimuli  Skin: Skin is warm and dry.  Nursing note and vitals reviewed.    ED Treatments / Results  DIAGNOSTIC STUDIES:  Oxygen Saturation is 100% on ETT, normal by my interpretation.     Labs (all labs ordered are listed, but only abnormal results are displayed) Labs Reviewed  MRSA PCR SCREENING - Abnormal; Notable for the following:       Result Value   MRSA by PCR POSITIVE (*)    All other components within normal limits  COMPREHENSIVE METABOLIC PANEL - Abnormal; Notable for the following:    CO2 19 (*)    Glucose, Bld 225 (*)    BUN 28 (*)    Creatinine, Ser 2.12 (*)    Calcium 7.6 (*)    Total Protein 5.2 (*)    Albumin 2.4 (*)    GFR calc non Af Amer 23 (*)    GFR calc Af Amer 27 (*)    All other components within normal limits  CBC - Abnormal; Notable for the following:    RBC 3.84 (*)    Hemoglobin 10.2 (*)    HCT 31.7 (*)    RDW 17.1 (*)    Platelets 107 (*)    All other components within normal limits  AMMONIA - Abnormal; Notable for the following:    Ammonia 171 (*)    All other components within normal limits  PROTIME-INR - Abnormal; Notable for the following:    Prothrombin  Time 18.3 (*)    All other components within normal limits  MAGNESIUM - Abnormal; Notable for the following:    Magnesium 0.8 (*)    All other components within normal limits  RAPID URINE DRUG SCREEN, HOSP PERFORMED - Abnormal; Notable for the following:    Benzodiazepines POSITIVE (*)    All other components within normal limits  MAGNESIUM - Abnormal; Notable for the following:    Magnesium 1.2 (*)    All other components within normal limits  CALCIUM, IONIZED - Abnormal; Notable for the following:    Calcium, Ionized, Serum 4.2 (*)    All other components within normal limits  HEMOGLOBIN A1C - Abnormal; Notable for the following:    Hgb A1c MFr Bld 8.5 (*)    All other components within normal limits  GLUCOSE, CAPILLARY - Abnormal; Notable for the following:    Glucose-Capillary 222 (*)    All other components within normal limits  CBC - Abnormal; Notable for the following:    WBC 20.0 (*)    Hemoglobin 11.1 (*)    HCT 33.9 (*)    RDW 17.4 (*)    Platelets 79 (*)    All other components within normal limits  BASIC METABOLIC PANEL - Abnormal; Notable for the following:    CO2 18 (*)    Glucose, Bld 241 (*)    BUN 31 (*)    Creatinine, Ser 2.00 (*)    Calcium 7.7 (*)    GFR calc non Af Amer 25 (*)    GFR calc Af Amer 29 (*)    All other components within normal limits  MAGNESIUM - Abnormal; Notable for the following:    Magnesium 1.5 (*)    All other components within normal limits  GLUCOSE, CAPILLARY - Abnormal; Notable for the following:    Glucose-Capillary 290 (*)    All other components within normal limits  GLUCOSE, CAPILLARY - Abnormal; Notable for the following:    Glucose-Capillary 230 (*)  All other components within normal limits  GLUCOSE, CAPILLARY - Abnormal; Notable for the following:    Glucose-Capillary 252 (*)    All other components within normal limits  GLUCOSE, CAPILLARY - Abnormal; Notable for the following:    Glucose-Capillary 220 (*)    All  other components within normal limits  BASIC METABOLIC PANEL - Abnormal; Notable for the following:    Chloride 112 (*)    CO2 16 (*)    Glucose, Bld 238 (*)    BUN 35 (*)    Creatinine, Ser 2.22 (*)    Calcium 7.8 (*)    GFR calc non Af Amer 22 (*)    GFR calc Af Amer 25 (*)    All other components within normal limits  GLUCOSE, CAPILLARY - Abnormal; Notable for the following:    Glucose-Capillary 210 (*)    All other components within normal limits  GLUCOSE, CAPILLARY - Abnormal; Notable for the following:    Glucose-Capillary 210 (*)    All other components within normal limits  CBC - Abnormal; Notable for the following:    WBC 14.2 (*)    RBC 3.63 (*)    Hemoglobin 9.9 (*)    HCT 30.4 (*)    RDW 17.9 (*)    Platelets 76 (*)    All other components within normal limits  GLUCOSE, CAPILLARY - Abnormal; Notable for the following:    Glucose-Capillary 252 (*)    All other components within normal limits  GLUCOSE, CAPILLARY - Abnormal; Notable for the following:    Glucose-Capillary 219 (*)    All other components within normal limits  GLUCOSE, CAPILLARY - Abnormal; Notable for the following:    Glucose-Capillary 262 (*)    All other components within normal limits  CBC - Abnormal; Notable for the following:    WBC 16.3 (*)    RBC 3.67 (*)    Hemoglobin 10.1 (*)    HCT 30.7 (*)    RDW 17.8 (*)    Platelets 69 (*)    All other components within normal limits  GLUCOSE, CAPILLARY - Abnormal; Notable for the following:    Glucose-Capillary 235 (*)    All other components within normal limits  BASIC METABOLIC PANEL - Abnormal; Notable for the following:    CO2 17 (*)    Glucose, Bld 299 (*)    BUN 42 (*)    Creatinine, Ser 2.28 (*)    Calcium 8.1 (*)    GFR calc non Af Amer 21 (*)    GFR calc Af Amer 25 (*)    All other components within normal limits  HEPATIC FUNCTION PANEL - Abnormal; Notable for the following:    Total Protein 5.1 (*)    Albumin 2.3 (*)    All  other components within normal limits  GLUCOSE, CAPILLARY - Abnormal; Notable for the following:    Glucose-Capillary 271 (*)    All other components within normal limits  CBG MONITORING, ED - Abnormal; Notable for the following:    Glucose-Capillary 226 (*)    All other components within normal limits  I-STAT ARTERIAL BLOOD GAS, ED - Abnormal; Notable for the following:    pH, Arterial 7.321 (*)    pO2, Arterial 385.0 (*)    Acid-base deficit 5.0 (*)    All other components within normal limits  CULTURE, RESPIRATORY (NON-EXPECTORATED)  CULTURE, BLOOD (ROUTINE X 2)  CULTURE, BLOOD (ROUTINE X 2)  TROPONIN I  URINALYSIS, ROUTINE W REFLEX MICROSCOPIC  PROCALCITONIN  TRIGLYCERIDES  SALICYLATE LEVEL  TSH  PHOSPHORUS  STREP PNEUMONIAE URINARY ANTIGEN  MAGNESIUM  PHOSPHORUS    EKG  EKG Interpretation None       Radiology No results found.  Procedures Procedures   INTUBATION 11:41 AM EMERGENT INTUBATION PROCEDURE NOTE INDICATION: airway compromise  TECHNIQUE: Unable to obtain consent because of emergent medical necessity.  After pre-oxygenating the patient for 3 minutes, a modified rapid-sequence induction was performed using etomidate and rocuronium with cricoid pressure. Using a Glidescope laryngoscope blade and 7.50m cuffed endotracheal tube was placed and secured.  Placement was confirmed with by auscultation, by CXR and ETCO2 monitor.  COMPLICATIONS: None. The patient tolerated the procedure well with no complications. POST PROCEDURE CXR: tube position acceptable   CRITICAL CARE Performed by: SVirgel Manifold MD Total critical care time: 45  minutes Critical care time was exclusive of separately billable procedures and treating other patients. Critical care was necessary to treat or prevent imminent or life-threatening deterioration. Critical care was time spent personally by me on the following activities: development of treatment plan with patient and/or surrogate  as well as nursing, discussions with consultants, evaluation of patient's response to treatment, examination of patient, obtaining history from patient or surrogate, ordering and performing treatments and interventions, ordering and review of laboratory studies, ordering and review of radiographic studies, pulse oximetry and re-evaluation of patient's condition.   Medications Ordered in ED Medications  etomidate (AMIDATE) injection (30 mg Intravenous Given 08/12/16 1147)  0.9 %  sodium chloride infusion (not administered)  enoxaparin (LOVENOX) injection 30 mg (30 mg Subcutaneous Given 08/13/16 1551)  pantoprazole sodium (PROTONIX) 40 mg/20 mL oral suspension 40 mg (40 mg Per Tube Given 08/14/16 0919)  ondansetron (ZOFRAN) injection 4 mg (not administered)  fentaNYL (SUBLIMAZE) injection 50 mcg (50 mcg Intravenous Given 08/14/16 0025)  insulin aspart (novoLOG) injection 0-15 Units (8 Units Subcutaneous Given 08/14/16 1234)  clopidogrel (PLAVIX) tablet 75 mg (75 mg Per Tube Given 08/14/16 0919)  propranolol (INDERAL) tablet 60 mg (60 mg Per Tube Given 08/14/16 0919)  levothyroxine (SYNTHROID, LEVOTHROID) tablet 75 mcg (75 mcg Oral Given 08/14/16 0824)  chlorhexidine gluconate (MEDLINE KIT) (PERIDEX) 0.12 % solution 15 mL (15 mLs Mouth Rinse Given 08/14/16 0827)  MEDLINE mouth rinse (15 mLs Mouth Rinse Given 08/14/16 1215)  rifaximin (XIFAXAN) tablet 550 mg (550 mg Per Tube Given 08/14/16 0919)  furosemide (LASIX) injection 40 mg (40 mg Intravenous Given 08/14/16 0915)  feeding supplement (VITAL HIGH PROTEIN) liquid 1,000 mL (1,000 mLs Per Tube Given 08/14/16 1240)  feeding supplement (PRO-STAT SUGAR FREE 64) liquid 30 mL (30 mLs Per Tube Given 08/14/16 0927)  cefTAZidime (FORTAZ) 2 g in dextrose 5 % 50 mL IVPB (2 g Intravenous Given 08/14/16 1011)  vancomycin (VANCOCIN) 1,500 mg in sodium chloride 0.9 % 500 mL IVPB (not administered)  mupirocin ointment (BACTROBAN) 2 % (not administered)  lactulose  (CHRONULAC) 10 GM/15ML solution 15 g (not administered)  insulin glargine (LANTUS) injection 15 Units (not administered)  propofol (DIPRIVAN) 1000 MG/100ML infusion (5 mcg/kg/min  108.5 kg Intravenous New Bag/Given 08/12/16 1226)  0.9 %  sodium chloride infusion ( Intravenous Stopped 08/12/16 1252)  magnesium sulfate IVPB 2 g 50 mL (0 g Intravenous Stopped 08/12/16 1330)  fentaNYL (SUBLIMAZE) injection 50 mcg (50 mcg Intravenous Given 08/13/16 0606)  magnesium sulfate IVPB 2 g 50 mL (2 g Intravenous Given 08/12/16 2136)  albumin human 25 % solution 12.5 g (12.5 g Intravenous Given 08/13/16 0606)  magnesium sulfate IVPB 2  g 50 mL (2 g Intravenous Given 08/13/16 1047)  vancomycin (VANCOCIN) 2,000 mg in sodium chloride 0.9 % 500 mL IVPB (2,000 mg Intravenous Given 08/13/16 1043)  cefTAZidime (FORTAZ) 2 g in dextrose 5 % 50 mL IVPB (2 g Intravenous Given 08/13/16 1043)     Initial Impression / Assessment and Plan / ED Course  I have reviewed the triage vital signs and the nursing notes.  Pertinent labs & imaging results that were available during my care of the patient were reviewed by me and considered in my medical decision making (see chart for details).     65yF with AMS. Had to be intubated for airway protection. Likely hepatic encephalopathy. History of the same. Ammonia 171.   Son at bedside. He doubts she is compliant. He relates that her husband reportedly said that he wasn't going to give her prescribed medications and instead was going to try herbal remedies.    Final Clinical Impressions(s) / ED Diagnoses   Final Diagnosis    Glasgow coma scale total score 3-8, at arrival to emergency department [facility])  Hepatic encephalopathy ([place])    New Prescriptions New Prescriptions   No medications on file   I personally preformed the services scribed in my presence. The recorded information has been reviewed is accurate. Virgel Manifold, MD.     Virgel Manifold, MD 08/14/16 1314

## 2016-08-12 NOTE — ED Notes (Signed)
Warm blankets applied

## 2016-08-12 NOTE — ED Notes (Signed)
Spoke with primary RN and updated family in waiting area on waiting to be brought back to treatment room to see patient.

## 2016-08-12 NOTE — ED Notes (Signed)
Pt taken to CT and back with this nurse. No issues

## 2016-08-12 NOTE — ED Notes (Signed)
MD at bedside. 

## 2016-08-12 NOTE — Progress Notes (Signed)
[facility]Link Physician-Brief Progress Note Patient Name: Monica AlmondDiane Y [name] DOB: [birth year] MRN: [id number]   Date of Service  08/12/2016  HPI/Events of Note  MG is 1.2  eICU Interventions  Will give 2 gm     Intervention Category Evaluation Type: Other  Erin FullingKurian Delia Sitar 08/12/2016, 9:07 PM

## 2016-08-12 NOTE — ED Notes (Signed)
Critical magnesium 0.8 Dr. Juleen ChinaKohut nofitied.

## 2016-08-12 NOTE — ED Notes (Signed)
Family at bedside. 

## 2016-08-12 NOTE — ED Triage Notes (Signed)
Per [place], pt was found in bed unresponsive by her husband roughly around 11am this morning. Rales, rhonchi in lung fields, pt given 1/2 of duo neb, 125 solumedrol. 99% on 4L [place]. VSS, CBG 315

## 2016-08-12 NOTE — ED Notes (Signed)
Attempted report 

## 2016-08-12 NOTE — ED Notes (Signed)
Pt had large amount of stool on buttocks, pt cleaned and new sheets placed on bed

## 2016-08-12 NOTE — Progress Notes (Signed)
Paged [facility] regarding pt brady [phone]. Dr. Belia HemanKasa aware. Will continue to monitor.  Roselie AwkwardMegan Terricka Onofrio, RN

## 2016-08-13 ENCOUNTER — Inpatient Hospital Stay (HOSPITAL_COMMUNITY): Payer: Medicare Other

## 2016-08-13 LAB — GLUCOSE, CAPILLARY
GLUCOSE-CAPILLARY: 252 mg/dL — AB (ref 65–99)
GLUCOSE-CAPILLARY: 220 mg/dL — AB (ref 65–99)
Glucose-Capillary: 210 mg/dL — ABNORMAL HIGH (ref 65–99)
Glucose-Capillary: 210 mg/dL — ABNORMAL HIGH (ref 65–99)

## 2016-08-13 LAB — MAGNESIUM: Magnesium: 1.5 mg/dL — ABNORMAL LOW (ref 1.7–2.4)

## 2016-08-13 LAB — BASIC METABOLIC PANEL
ANION GAP: 11 (ref 5–15)
Anion gap: 11 (ref 5–15)
BUN: 31 mg/dL — AB (ref 6–20)
BUN: 35 mg/dL — AB (ref 6–20)
CO2: 18 mmol/L — AB (ref 22–32)
CO2: 16 mmol/L — AB (ref 22–32)
CREATININE: 2.22 mg/dL — AB (ref 0.44–1.00)
Calcium: 7.7 mg/dL — ABNORMAL LOW (ref 8.9–10.3)
Calcium: 7.8 mg/dL — ABNORMAL LOW (ref 8.9–10.3)
Chloride: 109 mmol/L (ref 101–111)
Chloride: 112 mmol/L — ABNORMAL HIGH (ref 101–111)
Creatinine, Ser: 2 mg/dL — ABNORMAL HIGH (ref 0.44–1.00)
GFR calc Af Amer: 29 mL/min — ABNORMAL LOW (ref >60)
GFR calc Af Amer: 25 mL/min — ABNORMAL LOW (ref >60)
GFR calc non Af Amer: 25 mL/min — ABNORMAL LOW (ref >60)
GFR calc non Af Amer: 22 mL/min — ABNORMAL LOW (ref >60)
GLUCOSE: 241 mg/dL — AB (ref 65–99)
GLUCOSE: 238 mg/dL — AB (ref 65–99)
POTASSIUM: 4.2 mmol/L (ref 3.5–5.1)
Potassium: 4.3 mmol/L (ref 3.5–5.1)
Sodium: 138 mmol/L (ref 135–145)
Sodium: 139 mmol/L (ref 135–145)

## 2016-08-13 LAB — CALCIUM, IONIZED: CALCIUM, IONIZED, SERUM: 4.2 mg/dL — AB (ref 4.5–5.6)

## 2016-08-13 LAB — HEMOGLOBIN A1C
Hgb A1c MFr Bld: 8.5 % — ABNORMAL HIGH (ref 4.8–5.6)
Mean Plasma Glucose: 197 mg/dL

## 2016-08-13 LAB — CBC
HEMATOCRIT: 33.9 % — AB (ref 36.0–46.0)
Hemoglobin: 11.1 g/dL — ABNORMAL LOW (ref 12.0–15.0)
MCH: 27.3 pg (ref 26.0–34.0)
MCHC: 32.7 g/dL (ref 30.0–36.0)
MCV: 83.5 fL (ref 78.0–100.0)
Platelets: 79 10*3/uL — ABNORMAL LOW (ref 150–400)
RBC: 4.06 MIL/uL (ref 3.87–5.11)
RDW: 17.4 % — AB (ref 11.5–15.5)
WBC: 20 10*3/uL — AB (ref 4.0–10.5)

## 2016-08-13 LAB — STREP PNEUMONIAE URINARY ANTIGEN: Strep Pneumo Urinary Antigen: NEGATIVE

## 2016-08-13 LAB — PHOSPHORUS: Phosphorus: 3 mg/dL (ref 2.5–4.6)

## 2016-08-13 LAB — MRSA PCR SCREENING: MRSA BY PCR: POSITIVE — AB

## 2016-08-13 MED ORDER — DEXTROSE 5 % IV SOLN
2.0000 g | INTRAVENOUS | Status: DC
  Administered 2016-08-14: 2 g via INTRAVENOUS
  Filled 2016-08-13: qty 2

## 2016-08-13 MED ORDER — DEXTROSE 5 % IV SOLN
2.0000 g | Freq: Two times a day (BID) | INTRAVENOUS | Status: DC
  Filled 2016-08-13: qty 2

## 2016-08-13 MED ORDER — DEXTROSE 5 % IV SOLN
2.0000 g | Freq: Once | INTRAVENOUS | Status: AC
  Administered 2016-08-13: 2 g via INTRAVENOUS
  Filled 2016-08-13: qty 2

## 2016-08-13 MED ORDER — ALBUMIN HUMAN 25 % IV SOLN
12.5000 g | Freq: Once | INTRAVENOUS | Status: AC
Start: 2016-08-13 — End: 2016-08-13
  Administered 2016-08-13: 12.5 g via INTRAVENOUS
  Filled 2016-08-13: qty 50

## 2016-08-13 MED ORDER — VANCOMYCIN HCL 10 G IV SOLR
2000.0000 mg | Freq: Once | INTRAVENOUS | Status: AC
  Administered 2016-08-13: 2000 mg via INTRAVENOUS
  Filled 2016-08-13: qty 2000

## 2016-08-13 MED ORDER — VANCOMYCIN HCL 10 G IV SOLR
1250.0000 mg | INTRAVENOUS | Status: DC
  Filled 2016-08-13: qty 1250

## 2016-08-13 MED ORDER — FUROSEMIDE 10 MG/ML IJ SOLN
40.0000 mg | Freq: Two times a day (BID) | INTRAMUSCULAR | Status: DC
  Administered 2016-08-13 – 2016-08-16 (×7): 40 mg via INTRAVENOUS
  Filled 2016-08-13 (×8): qty 4

## 2016-08-13 MED ORDER — RIFAXIMIN 550 MG PO TABS
550.0000 mg | ORAL_TABLET | Freq: Two times a day (BID) | ORAL | Status: DC
  Administered 2016-08-13 – 2016-08-18 (×9): 550 mg
  Filled 2016-08-13 (×11): qty 1

## 2016-08-13 MED ORDER — INSULIN GLARGINE 100 UNIT/ML ~~LOC~~ SOLN
15.0000 [IU] | Freq: Every day | SUBCUTANEOUS | Status: DC
  Administered 2016-08-13 – 2016-08-14 (×2): 15 [IU] via SUBCUTANEOUS
  Filled 2016-08-13 (×2): qty 0.15

## 2016-08-13 MED ORDER — PRO-STAT SUGAR FREE PO LIQD
30.0000 mL | Freq: Three times a day (TID) | ORAL | Status: DC
  Administered 2016-08-13 – 2016-08-14 (×4): 30 mL
  Filled 2016-08-13 (×10): qty 30

## 2016-08-13 MED ORDER — MAGNESIUM SULFATE 2 GM/50ML IV SOLN
2.0000 g | Freq: Once | INTRAVENOUS | Status: AC
  Administered 2016-08-13: 2 g via INTRAVENOUS
  Filled 2016-08-13: qty 50

## 2016-08-13 MED ORDER — VITAL HIGH PROTEIN PO LIQD
1000.0000 mL | ORAL | Status: DC
  Administered 2016-08-13: 1000 mL

## 2016-08-13 MED ORDER — VITAL HIGH PROTEIN PO LIQD
1000.0000 mL | ORAL | Status: DC
  Administered 2016-08-13 – 2016-08-14 (×2): 1000 mL

## 2016-08-13 NOTE — Progress Notes (Signed)
eLink Physician-Brief Progress Note Patient Name: Monica AlmondDiane Y [name] DOB: 1950-09-13 MRN: [id number]   Date of Service  08/13/2016  HPI/Events of Note  Oliguria - Albumin = 2.4.  eICU Interventions  Will order: 1. 25% Albumin 12.5 gm IV now.      Intervention Category Intermediate Interventions: Oliguria - evaluation and management  Sommer,Steven Eugene 08/13/2016, 5:06 AM

## 2016-08-13 NOTE — Progress Notes (Signed)
Inpatient Diabetes Program Recommendations  AACE/ADA: New Consensus Statement on Inpatient Glycemic Control (2015)  Target Ranges:  Prepandial:   less than 140 mg/dL      Peak postprandial:   less than 180 mg/dL (1-2 hours)      Critically ill patients:  140 - 180 mg/dL   Lab Results  Component Value Date   GLUCAP 220 (H) 08/13/2016   HGBA1C 8.5 (H) 08/12/2016    Review of Glycemic Control:  Results for Mel AlmondCAUSEY, Monica Y (MRN [id number]) as of 08/13/2016 11:41  Ref. Range 08/12/2016 16:06 08/12/2016 19:59 08/12/2016 23:49 08/13/2016 03:56 08/13/2016 08:05  Glucose-Capillary Latest Ref Range: 65 - 99 mg/dL 409222 (H) 811290 (H) 914230 (H) 252 (H) 220 (H)   Diabetes history: Type 2 diabetes Outpatient Diabetes medications: Lantus 20-24 units bid, Novolog 20 units tid with meals Current orders for Inpatient glycemic control:  Lantus 15 units daily, Novolog moderate q 4 hours  Inpatient Diabetes Program Recommendations:    Note Lantus added today.  May consider IV insulin if CBG's remain >goal-ICU glycemic control order set.   Thanks, Beryl MeagerJenny Taytem Ghattas, RN, BC-ADM Inpatient Diabetes Coordinator Pager [phone] (8a-5p)

## 2016-08-13 NOTE — Progress Notes (Addendum)
Initial Nutrition Assessment  DOCUMENTATION CODES:   Morbid obesity  INTERVENTION:    Vital High Protein at 40 ml/h (960 ml per day)  Pro-stat 30 ml TID  Provides 1260 kcal, 129 gm protein, 803 ml free water daily  Total intake with Propofol and TF will be 1432 kcal   NUTRITION DIAGNOSIS:   Inadequate oral intake related to inability to eat as evidenced by NPO status.  GOAL:   Provide needs based on ASPEN/SCCM guidelines  MONITOR:   Vent status, TF tolerance, Labs, I & O's  REASON FOR ASSESSMENT:   Consult Enteral/tube feeding initiation and management  ASSESSMENT:   66 y.o. female with PMH of cirrhosis, NASH, OSA, HTN, HLD, PVD, CAD, DM, hypothyroidism, admitted 3/27 with acute hepatic encephalopathy requiring mech ventilation.  Discussed patient in ICU rounds and with RN today. Received MD Consult for TF initiation and management. Nutrition-Focused physical exam completed. Findings are no fat depletion, no muscle depletion, and severe edema.  Patient is currently intubated on ventilator support MV: 8.4 L/min Temp (24hrs), Avg:98.4 F (36.9 C), Min:96.8 F (36 C), Max:99.3 F (37.4 C)  Propofol: 6.5 ml/hr providing 172 kcal from lipid  Labs reviewed: magnesium 1.5 (L) CBG's: 252-220 Medications reviewed and include Lasix and Lactulose.   Diet Order:  Diet NPO time specified  Skin:  Reviewed, no issues  Last BM:  PTA  Height:   Ht Readings from Last 1 Encounters:  08/12/16 5\' 3"  (1.6 m)    Weight:   Wt Readings from Last 1 Encounters:  08/13/16 239 lb 6.7 oz (108.6 kg)    Ideal Body Weight:  52.3 kg  BMI:  Body mass index is 42.41 kg/m.  Estimated Nutritional Needs:   Kcal:  [phone]  Protein:  130 gm  Fluid:  1.6-1.8 L  EDUCATION NEEDS:   No education needs identified at this time  Joaquin CourtsKimberly Harris, RD, LDN, CNSC Pager [phone] After Hours Pager [phone]

## 2016-08-13 NOTE — Progress Notes (Signed)
eLink Physician-Brief Progress Note Patient Name: Monica AlmondDiane Y Poser DOB: 1950-05-26 MRN: [id number]   Date of Service  08/13/2016  HPI/Events of Note  Patient reaching for ETT  eICU Interventions  Soft wrist restraints ordered     Intervention Category Evaluation Type: Other  Erin FullingKurian Jaliyah Fotheringham 08/13/2016, 7:46 PM

## 2016-08-13 NOTE — Progress Notes (Addendum)
PULMONARY / CRITICAL CARE MEDICINE   Name: Monica [name] MRN: [id number] DOB: 1950-07-10    ADMISSION DATE:  08/12/2016 CONSULTATION DATE:  08/12/16  REFERRING MD:  Juleen China  CHIEF COMPLAINT:  AMS  HISTORY OF PRESENT ILLNESS:  Pt is encephelopathic; therefore, this HPI is obtained from chart review. Monica [name] is a 66 y.o. female with PMH as outlined below. She was brought to [facility] ED 3/27 after being found in her bed unresponsive by husband around 11AM that morning.  In ED, she remained essentially unresponsive and subsequently required intubation for airway protection.  Ammonia elevated at 171, AoCKD (SCr 2.12), Mag 0.8, Osmolality 305, UDS positive for benzo's, CT head negative. Recent admission 3/9-3/12 for same issue. Per husband and daughter, has been compliant with lactulose   SUBJECTIVE:  Critically ill, On vent, more responsive. Copious secretions afebrile  VITAL SIGNS: BP (!) 131/50   Pulse 75   Temp 98.6 F (37 C) (Oral)   Resp (!) 22   Ht 5\' 3"  (1.6 m)   Wt 239 lb 6.7 oz (108.6 kg)   SpO2 100%   BMI 42.41 kg/m   HEMODYNAMICS:    VENTILATOR SETTINGS: Vent Mode: PRVC FiO2 (%):  [40 %-50 %] 40 % Set Rate:  [18 bmp] 18 bmp Vt Set:  [430 mL] 430 mL PEEP:  [5 cmH20] 5 cmH20 Plateau Pressure:  [11 cmH20-26 cmH20] 19 cmH20  INTAKE / OUTPUT: I/O last 3 completed shifts: In: 2111.2 [I.V.:1931.2; NG/GT:30; IV Piggyback:150] Out: 300 [Urine:300]   PHYSICAL EXAMINATION: Gen. , obese, in no distress, intubated ENT - ETT oral, no post nasal drip Neck: No JVD, no thyromegaly, no carotid bruits Lungs: no use of accessory muscles, no dullness to percussion, decreased without rales or rhonchi  Cardiovascular: Rhythm regular, heart sounds  normal, no murmurs or gallops, no peripheral edema Abdomen: soft and non-tender, no hepatosplenomegaly, BS normal. Musculoskeletal: No deformities, no cyanosis or clubbing Neuro:awake, follows 1 step commands, non focal, no  tremors   LABS:  BMET  Recent Labs Lab 08/12/16 1154 08/13/16 0244  NA 138 138  K 4.4 4.2  CL 109 109  CO2 19* 18*  BUN 28* 31*  CREATININE 2.12* 2.00*  GLUCOSE 225* 241*    Electrolytes  Recent Labs Lab 08/12/16 1154 08/12/16 1616 08/13/16 0244  CALCIUM 7.6*  --  7.7*  MG 0.8* 1.2* 1.5*  PHOS  --   --  3.0    CBC  Recent Labs Lab 08/12/16 1154 08/13/16 0244  WBC 9.1 20.0*  HGB 10.2* 11.1*  HCT 31.7* 33.9*  PLT 107* 79*    Coag's  Recent Labs Lab 08/12/16 1154  INR 1.50    Sepsis Markers  Recent Labs Lab 08/12/16 1154  PROCALCITON <0.10    ABG  Recent Labs Lab 08/12/16 1348  PHART 7.321*  PCO2ART 40.0  PO2ART 385.0*    Liver Enzymes  Recent Labs Lab 08/12/16 1154  AST 25  ALT 19  ALKPHOS 111  BILITOT 0.9  ALBUMIN 2.4*    Cardiac Enzymes  Recent Labs Lab 08/12/16 1154  TROPONINI <0.03    Glucose  Recent Labs Lab 08/12/16 1145 08/12/16 1606 08/12/16 1959 08/12/16 2349 08/13/16 0356 08/13/16 0805  GLUCAP 226* 222* 290* 230* 252* 220*    Imaging Ct Head Wo Contrast  Result Date: 08/12/2016 CLINICAL DATA:  66 year old female found unresponsive this morning, intubated. Initial encounter. EXAM: CT HEAD WITHOUT CONTRAST TECHNIQUE: Contiguous axial images were obtained from the base of the  skull through the vertex without intravenous contrast. COMPARISON:  Brain MRI 07/25/2016 and earlier. FINDINGS: Brain: Normal for age cerebral volume. No midline shift, ventriculomegaly, mass effect, evidence of mass lesion, intracranial hemorrhage or evidence of cortically based acute infarction. Gray-white matter differentiation is within normal limits throughout the brain. Vascular: Calcified atherosclerosis at the skull base. No suspicious intracranial vascular hyperdensity. Skull: Intact.  No acute osseous abnormality identified. Sinuses/Orbits: Clear. Fluid in the pharynx and posterior nasal cavity in the setting of intubation.  Partially visible oral enteric tube. Other: Visualized orbits and scalp soft tissues are within normal limits. IMPRESSION: 1. Stable and normal for age non contrast CT appearance of the brain. 2. Fluid in the nasopharynx and posterior nasal cavity in the setting of intubation. Electronically Signed   By: Odessa Fleming M.D.   On: 08/12/2016 13:35   Dg Chest Port 1 View  Result Date: 08/13/2016 CLINICAL DATA:  Respiratory failure.  Shortness of breath. EXAM: PORTABLE CHEST 1 VIEW COMPARISON:  08/12/2016. FINDINGS: Surgical clips left neck. Endotracheal tube and NG tube in stable position. Heart size normal. Diffuse left lung infiltrate with small left pleural effusion. No pneumothorax. IMPRESSION: 1. Lines and tubes in stable position. 2. Developing diffuse infiltrate left lung. Findings consistent pneumonia . Small left pleural effusion. Electronically Signed   By: Maisie Fus  Register   On: 08/13/2016 07:02   Dg Chest Portable 1 View  Result Date: 08/12/2016 CLINICAL DATA:  Altered mental status.  Unresponsive.  Intubated. EXAM: PORTABLE CHEST 1 VIEW COMPARISON:  07/25/2016 FINDINGS: Endotracheal tube tip is 3 cm above the carina. Nasogastric tube enters the stomach. The lungs are clear. No effusions. No acute bone finding. IMPRESSION: Endotracheal tube and nasogastric tube well positioned. No active disease. Electronically Signed   By: Paulina Fusi M.D.   On: 08/12/2016 12:14   Dg Abd Portable 1v  Result Date: 08/12/2016 CLINICAL DATA:  NG tube placement EXAM: PORTABLE ABDOMEN - 1 VIEW COMPARISON:  None. FINDINGS: There is normal small bowel gas pattern. NG tube in place with tip in distal stomach. IMPRESSION: NG tube in place with tip in distal stomach region. Electronically Signed   By: Natasha Mead M.D.   On: 08/12/2016 20:19     STUDIES:  CXR 3/27 > negative. CT head 3/27 > negative.  CULTURES: Blood 3/27 >  Sputum 3/27 >  Urine 3/27 >   ANTIBIOTICS:   SIGNIFICANT EVENTS: 3/27 >  admit.  LINES/TUBES: ETT 3/27 >   DISCUSSION: 66 y.o. female admitted 3/27 with acute hepatic encephalopathy requiring mech ventilation  ASSESSMENT / PLAN:  GASTROINTESTINAL A:   Elevated ammonia. Nutrition. Hx cirrhosis with NASH (Followed by Dr. Marina Goodell, last seen 2016), GERD, hiatal hernia, ? esophageal varices, IBS. P:   Continue lactulose and rifaximin per tube -titrate to 2-3 soft BM/d NPO. Continue preadmission PPI, propranolol.  PULMONARY A: VDRF - due to inability to protect the airway in setting of AMS. Left HCAP ? aspiration Hx OSA (unsure of CPAP use). Tobacco dependence. P:   SBTs as able -hold odd extubation until secretions decrease & MS improves VAP prevention measures. Tobacco cessation counseling once extubated.  CARDIOVASCULAR A:  Hx HTN, HLD, PVD, CAD. P:  Continue preadmission plavix. Hold preadmission simvastatin, isosorbide, spironolactone.  RENAL A:   AoCKD. Hypomagnesemia - s/p 2g Mag. Pseudohypocalcemia - corrects to 8.8. P:   Dc IVFs Replete mg Lasix 40 daily  HEMATOLOGIC A:   Thrombocytopenia. VTE Prophylaxis. P:  Monitor platelet counts. SCD's / lovenox.  CBC in AM.  INFECTIOUS A:   HCAP ? aspiration, PCN allergic P:  ceftaz / vanc  Await resp cx   ENDOCRINE A:   Hx Hypothyroidism, uncontrolled DM. TSH nml P:   Continue preadmission synthroid SSI. Resue lantus, novolog.   NEUROLOGIC A:   Acute hepatic encephalopathy P:   Sedation:  Propofol gtt / Fentanyl PRN. RASS goal: 0 to -1. Daily WUA. Continue lactulose / rifaximin as above. Hold preadmission alprazolam, gabapentin.  Family updated: husband & daughter at bedside, guarded prognosis  Interdisciplinary Family Meeting v Palliative Care Meeting:  Due by: 08/17/16.  CC time: 35 min.  Cyril Mourningakesh Micayla Brathwaite MD. Tonny BollmanFCCP. [facility] Pager [phone] If no response call [phone]    08/13/2016, 8:57 AM

## 2016-08-13 NOTE — Progress Notes (Signed)
Pharmacy Antibiotic Note  Monica [name] is a 66 y.o. female admitted on 08/12/2016 after being found unresponsive, urine drug screen + for benzodiazepines. Has NASH. Starting abx for possible aspiration pneumonia. WBC 9.1 > 20, currently afebrile.    Plan: -Vancomycin 2 g IV x1 then 1250 mg IV q24h  -Ceftazidime 2 g IV q12h -Monitor renal fx, cultures, VR as needed    Height: 5\' 3"  (160 cm) Weight: 239 lb 6.7 oz (108.6 kg) IBW/kg (Calculated) : 52.4  Temp (24hrs), Avg:98.4 F (36.9 C), Min:96.8 F (36 C), Max:99.3 F (37.4 C)   Recent Labs Lab 08/12/16 1154 08/13/16 0244  WBC 9.1 20.0*  CREATININE 2.12* 2.00*    Estimated Creatinine Clearance: 32.7 mL/min (A) (by C-G formula based on SCr of 2 mg/dL (H)).    Allergies  Allergen Reactions  . Biaxin [Clarithromycin] Shortness Of Breath  . Gatifloxacin Shortness Of Breath  . Sulfamethoxazole Shortness Of Breath  . Sulfonamide Derivatives Shortness Of Breath  . Augmentin [Amoxicillin-Pot Clavulanate]     Reaction not known by husband  . Invokana [Canagliflozin]     Reaction unknown by husband  . Latex Other (See Comments)    Reaction unknown by husband  . Tape Hives  . Penicillins Hives and Rash    Has patient had a PCN reaction causing immediate rash, facial/tongue/throat swelling, SOB or lightheadedness with hypotension: Yes Has patient had a PCN reaction causing severe rash involving mucus membranes or skin necrosis: No Has patient had a PCN reaction that required hospitalization: No Has patient had a PCN reaction occurring within the last 10 years: No If all of the above answers are "NO", then may proceed with Cephalosporin use.    Antimicrobials this admission: 3/28 vancomycin > 3/28 ceftazidime >   Dose adjustments this admission: N/A   Microbiology results: 3/28 blood cx: 3/27 mrsa pcr: + 3/27 resp cx: ip   Thank you for allowing pharmacy to be a part of this patient's care.  Monica [name], Monica [name]  M 08/13/2016 9:11 AM

## 2016-08-14 ENCOUNTER — Inpatient Hospital Stay (HOSPITAL_COMMUNITY): Payer: Medicare Other

## 2016-08-14 DIAGNOSIS — J189 Pneumonia, unspecified organism: Secondary | ICD-10-CM

## 2016-08-14 LAB — BASIC METABOLIC PANEL
Anion gap: 11 (ref 5–15)
BUN: 42 mg/dL — AB (ref 6–20)
CHLORIDE: 111 mmol/L (ref 101–111)
CO2: 17 mmol/L — AB (ref 22–32)
Calcium: 8.1 mg/dL — ABNORMAL LOW (ref 8.9–10.3)
Creatinine, Ser: 2.28 mg/dL — ABNORMAL HIGH (ref 0.44–1.00)
GFR calc Af Amer: 25 mL/min — ABNORMAL LOW (ref >60)
GFR calc non Af Amer: 21 mL/min — ABNORMAL LOW (ref >60)
Glucose, Bld: 299 mg/dL — ABNORMAL HIGH (ref 65–99)
POTASSIUM: 3.9 mmol/L (ref 3.5–5.1)
SODIUM: 139 mmol/L (ref 135–145)

## 2016-08-14 LAB — GLUCOSE, CAPILLARY
GLUCOSE-CAPILLARY: 219 mg/dL — AB (ref 65–99)
GLUCOSE-CAPILLARY: 235 mg/dL — AB (ref 65–99)
GLUCOSE-CAPILLARY: 252 mg/dL — AB (ref 65–99)
GLUCOSE-CAPILLARY: 262 mg/dL — AB (ref 65–99)
Glucose-Capillary: 183 mg/dL — ABNORMAL HIGH (ref 65–99)
Glucose-Capillary: 198 mg/dL — ABNORMAL HIGH (ref 65–99)
Glucose-Capillary: 221 mg/dL — ABNORMAL HIGH (ref 65–99)
Glucose-Capillary: 271 mg/dL — ABNORMAL HIGH (ref 65–99)

## 2016-08-14 LAB — CBC
HCT: 30.7 % — ABNORMAL LOW (ref 36.0–46.0)
HEMATOCRIT: 30.4 % — AB (ref 36.0–46.0)
HEMOGLOBIN: 9.9 g/dL — AB (ref 12.0–15.0)
Hemoglobin: 10.1 g/dL — ABNORMAL LOW (ref 12.0–15.0)
MCH: 27.5 pg (ref 26.0–34.0)
MCH: 27.3 pg (ref 26.0–34.0)
MCHC: 32.9 g/dL (ref 30.0–36.0)
MCHC: 32.6 g/dL (ref 30.0–36.0)
MCV: 83.7 fL (ref 78.0–100.0)
MCV: 83.7 fL (ref 78.0–100.0)
PLATELETS: 69 10*3/uL — AB (ref 150–400)
Platelets: 76 10*3/uL — ABNORMAL LOW (ref 150–400)
RBC: 3.67 MIL/uL — AB (ref 3.87–5.11)
RBC: 3.63 MIL/uL — AB (ref 3.87–5.11)
RDW: 17.8 % — AB (ref 11.5–15.5)
RDW: 17.9 % — ABNORMAL HIGH (ref 11.5–15.5)
WBC: 16.3 10*3/uL — ABNORMAL HIGH (ref 4.0–10.5)
WBC: 14.2 10*3/uL — AB (ref 4.0–10.5)

## 2016-08-14 LAB — CULTURE, RESPIRATORY W GRAM STAIN

## 2016-08-14 LAB — BLOOD CULTURE ID PANEL (REFLEXED)
ACINETOBACTER BAUMANNII: NOT DETECTED
CANDIDA ALBICANS: NOT DETECTED
CANDIDA PARAPSILOSIS: NOT DETECTED
Candida glabrata: NOT DETECTED
Candida krusei: NOT DETECTED
Candida tropicalis: NOT DETECTED
ENTEROBACTERIACEAE SPECIES: NOT DETECTED
Enterobacter cloacae complex: NOT DETECTED
Enterococcus species: NOT DETECTED
Escherichia coli: NOT DETECTED
HAEMOPHILUS INFLUENZAE: NOT DETECTED
KLEBSIELLA OXYTOCA: NOT DETECTED
Klebsiella pneumoniae: NOT DETECTED
LISTERIA MONOCYTOGENES: NOT DETECTED
METHICILLIN RESISTANCE: NOT DETECTED
NEISSERIA MENINGITIDIS: NOT DETECTED
PSEUDOMONAS AERUGINOSA: NOT DETECTED
Proteus species: NOT DETECTED
STREPTOCOCCUS AGALACTIAE: NOT DETECTED
STREPTOCOCCUS PNEUMONIAE: NOT DETECTED
STREPTOCOCCUS PYOGENES: NOT DETECTED
STREPTOCOCCUS SPECIES: NOT DETECTED
Serratia marcescens: NOT DETECTED
Staphylococcus aureus (BCID): NOT DETECTED
Staphylococcus species: DETECTED — AB

## 2016-08-14 LAB — HEPATIC FUNCTION PANEL
ALK PHOS: 89 U/L (ref 38–126)
ALT: 17 U/L (ref 14–54)
AST: 23 U/L (ref 15–41)
Albumin: 2.3 g/dL — ABNORMAL LOW (ref 3.5–5.0)
BILIRUBIN INDIRECT: 0.8 mg/dL (ref 0.3–0.9)
BILIRUBIN TOTAL: 1 mg/dL (ref 0.3–1.2)
Bilirubin, Direct: 0.2 mg/dL (ref 0.1–0.5)
TOTAL PROTEIN: 5.1 g/dL — AB (ref 6.5–8.1)

## 2016-08-14 LAB — PHOSPHORUS: Phosphorus: 2.8 mg/dL (ref 2.5–4.6)

## 2016-08-14 LAB — CULTURE, RESPIRATORY

## 2016-08-14 LAB — MAGNESIUM: Magnesium: 1.7 mg/dL (ref 1.7–2.4)

## 2016-08-14 MED ORDER — DEXTROSE 5 % IV SOLN: 2.0000 g | INTRAVENOUS | Status: DC

## 2016-08-14 MED ORDER — IPRATROPIUM-ALBUTEROL 0.5-2.5 (3) MG/3ML IN SOLN
RESPIRATORY_TRACT | Status: AC
  Administered 2016-08-14: 3 mL
  Filled 2016-08-14: qty 3

## 2016-08-14 MED ORDER — INSULIN GLARGINE 100 UNIT/ML ~~LOC~~ SOLN
15.0000 [IU] | Freq: Two times a day (BID) | SUBCUTANEOUS | Status: DC
  Administered 2016-08-14 – 2016-08-18 (×8): 15 [IU] via SUBCUTANEOUS
  Filled 2016-08-14 (×10): qty 0.15

## 2016-08-14 MED ORDER — LACTULOSE 10 GM/15ML PO SOLN
15.0000 g | Freq: Two times a day (BID) | ORAL | Status: DC
  Administered 2016-08-15 – 2016-08-18 (×5): 15 g
  Filled 2016-08-14 (×7): qty 30

## 2016-08-14 MED ORDER — DEXTROSE 5 % IV SOLN
1.0000 g | INTRAVENOUS | Status: DC
  Administered 2016-08-15 – 2016-08-18 (×4): 1 g via INTRAVENOUS
  Filled 2016-08-14 (×4): qty 10

## 2016-08-14 MED ORDER — MUPIROCIN 2 % EX OINT
TOPICAL_OINTMENT | Freq: Two times a day (BID) | CUTANEOUS | Status: DC
  Administered 2016-08-14: 23:00:00 via NASAL
  Administered 2016-08-14: 1 via NASAL
  Administered 2016-08-15 – 2016-08-18 (×6): via NASAL
  Filled 2016-08-14 (×3): qty 22

## 2016-08-14 MED ORDER — VANCOMYCIN HCL 10 G IV SOLR: 1500.0000 mg | INTRAVENOUS | Status: DC

## 2016-08-14 MED ORDER — IPRATROPIUM-ALBUTEROL 0.5-2.5 (3) MG/3ML IN SOLN: 3.0000 mL | Freq: Four times a day (QID) | RESPIRATORY_TRACT | Status: DC | PRN

## 2016-08-14 NOTE — Progress Notes (Addendum)
Paged [facility] regarding pt PO night meds. Dr. Arsenio LoaderSommer aware pt is NPO at this time. RN given order for sips with meds and then try to give meds PO.  Update [id number]- RN spoke with Dr. Darrick Pennaeterding regarding pt PO Lactulose. Orders given to hold dose tonight due to pt high risk for re-intubation.   No PO meds given tonight.  Will follow orders and continue to monitor.  Roselie AwkwardMegan Avereigh Spainhower, RN

## 2016-08-14 NOTE — Progress Notes (Addendum)
  PHARMACY - PHYSICIAN COMMUNICATION CRITICAL VALUE ALERT - BLOOD CULTURE IDENTIFICATION (BCID)  Results for orders placed or performed during the hospital encounter of 08/12/16  Blood Culture ID Panel (Reflexed) (Collected: 08/13/2016 10:32 AM)  Result Value Ref Range   Enterococcus species NOT DETECTED NOT DETECTED   Listeria monocytogenes NOT DETECTED NOT DETECTED   Staphylococcus species DETECTED (A) NOT DETECTED   Staphylococcus aureus NOT DETECTED NOT DETECTED   Methicillin resistance NOT DETECTED NOT DETECTED   Streptococcus species NOT DETECTED NOT DETECTED   Streptococcus agalactiae NOT DETECTED NOT DETECTED   Streptococcus pneumoniae NOT DETECTED NOT DETECTED   Streptococcus pyogenes NOT DETECTED NOT DETECTED   Acinetobacter baumannii NOT DETECTED NOT DETECTED   Enterobacteriaceae species NOT DETECTED NOT DETECTED   Enterobacter cloacae complex NOT DETECTED NOT DETECTED   Escherichia coli NOT DETECTED NOT DETECTED   Klebsiella oxytoca NOT DETECTED NOT DETECTED   Klebsiella pneumoniae NOT DETECTED NOT DETECTED   Proteus species NOT DETECTED NOT DETECTED   Serratia marcescens NOT DETECTED NOT DETECTED   Haemophilus influenzae NOT DETECTED NOT DETECTED   Neisseria meningitidis NOT DETECTED NOT DETECTED   Pseudomonas aeruginosa NOT DETECTED NOT DETECTED   Candida albicans NOT DETECTED NOT DETECTED   Candida glabrata NOT DETECTED NOT DETECTED   Candida krusei NOT DETECTED NOT DETECTED   Candida parapsilosis NOT DETECTED NOT DETECTED   Candida tropicalis NOT DETECTED NOT DETECTED    Name of physician (or Provider) Contacted: Alva   Changes to prescribed antibiotics required: 1/2 MRSE - deemed a contaminant. Vanc d/ced.  Rolley SimsMartin, Addasyn Mcbreen Ann 08/14/2016  3:10 PM

## 2016-08-14 NOTE — Progress Notes (Addendum)
PULMONARY / CRITICAL CARE MEDICINE   Name: Monica [name] MRN: [id number] DOB: [birth year]    ADMISSION DATE:  08/12/2016 CONSULTATION DATE:  08/12/16  REFERRING MD:  Juleen China  CHIEF COMPLAINT:  AMS  HISTORY OF PRESENT ILLNESS:   Monica [name] is a 66 y.o. female with PMH as outlined below. She was brought to [facility] ED 3/27 after being found in her bed unresponsive by husband around 11AM that morning.  In ED, she required intubation for airway protection.  Ammonia elevated at 171, AoCKD (SCr 2.12), Mag 0.8, Osmolality 305, UDS positive for benzo's, CT head negative. Recent admission 3/9-3/12 for same issue. Per husband and daughter, has been compliant with lactulose   SUBJECTIVE:  Critically ill, On vent Sedated on propofol gtt decreased secretions afebrile  VITAL SIGNS: BP (!) 113/46   Pulse 62   Temp 98 F (36.7 C) (Oral)   Resp 18   Ht 5\' 3"  (1.6 m)   Wt 240 lb 11.9 oz (109.2 kg)   SpO2 100%   BMI 42.65 kg/m   HEMODYNAMICS:    VENTILATOR SETTINGS: Vent Mode: CPAP;PSV FiO2 (%):  [40 %] 40 % Set Rate:  [18 bmp] 18 bmp Vt Set:  [430 mL] 430 mL PEEP:  [5 cmH20] 5 cmH20 Pressure Support:  [8 cmH20] 8 cmH20 Plateau Pressure:  [20 cmH20-21 cmH20] 20 cmH20  INTAKE / OUTPUT: I/O last 3 completed shifts: In: 3341.7 [I.V.:2292.4; NG/GT:949.3; IV Piggyback:100] Out: 1065 [Urine:765; Stool:300]   PHYSICAL EXAMINATION: Gen. , obese, in no distress, intubated ENT - ETT oral, no post nasal drip Neck: No JVD, no thyromegaly, no carotid bruits Lungs: no use of accessory muscles, no dullness to percussion, decreased without rales or rhonchi  Cardiovascular: Rhythm regular, heart sounds  normal, no murmurs or gallops, no peripheral edema Abdomen: soft and non-tender, no hepatosplenomegaly, BS normal. Musculoskeletal: No deformities, no cyanosis or clubbing Neuro:awake, follows 1 step commands, non focal, no tremors   LABS:  BMET  Recent Labs Lab 08/12/16 1154  08/13/16 0244 08/13/16 1637  NA 138 138 139  K 4.4 4.2 4.3  CL 109 109 112*  CO2 19* 18* 16*  BUN 28* 31* 35*  CREATININE 2.12* 2.00* 2.22*  GLUCOSE 225* 241* 238*    Electrolytes  Recent Labs Lab 08/12/16 1154 08/12/16 1616 08/13/16 0244 08/13/16 1637  CALCIUM 7.6*  --  7.7* 7.8*  MG 0.8* 1.2* 1.5*  --   PHOS  --   --  3.0  --     CBC  Recent Labs Lab 08/13/16 0244 08/14/16 0347 08/14/16 0725  WBC 20.0* 16.3* 14.2*  HGB 11.1* 10.1* 9.9*  HCT 33.9* 30.7* 30.4*  PLT 79* 69* 76*    Coag's  Recent Labs Lab 08/12/16 1154  INR 1.50    Sepsis Markers  Recent Labs Lab 08/12/16 1154  PROCALCITON <0.10    ABG  Recent Labs Lab 08/12/16 1348  PHART 7.321*  PCO2ART 40.0  PO2ART 385.0*    Liver Enzymes  Recent Labs Lab 08/12/16 1154  AST 25  ALT 19  ALKPHOS 111  BILITOT 0.9  ALBUMIN 2.4*    Cardiac Enzymes  Recent Labs Lab 08/12/16 1154  TROPONINI <0.03    Glucose  Recent Labs Lab 08/13/16 1205 08/13/16 1539 08/13/16 1953 08/13/16 2337 08/14/16 0319 08/14/16 0813  GLUCAP 210* 210* 252* 219* 262* 235*    Imaging Dg Chest Port 1 View  Result Date: 08/14/2016 CLINICAL DATA:  Acute respiratory failure EXAM: PORTABLE CHEST 1  VIEW COMPARISON:  Portable chest x-ray of August 13, 2016 FINDINGS: The patient is rotated on today's study. The lungs are well-expanded. The basilar lung markings on the right remain coarse. On the left the interstitial markings in the mid and lower lung remain increased. The left hemidiaphragm is better demonstrated today. The heart is top-normal in size. The central pulmonary vascularity is prominent. The endotracheal tube tip lies 2.7 cm above the carina. The esophagogastric tube tip projects below the inferior margin of the image. IMPRESSION: Allowing for differences in positioning there has not been significant interval change in the appearance of the chest. Persistent patchy interstitial infiltrates in the  left mid and lower lung and at the right lung base. The support tubes are in reasonable position. Electronically Signed   By: David  SwazilandJordan M.D.   On: 08/14/2016 07:31     STUDIES:  CXR 3/27 > negative. CT head 3/27 > negative.  CULTURES: Blood 3/27 > MRSE 1/2  Sputum 3/27 > H flu  Urine 3/27 >   ANTIBIOTICS: ceftaz 3/28  (aspiration) >> vanc 3/28 >>  SIGNIFICANT EVENTS: 3/27 > admit.  LINES/TUBES: ETT 3/27 >   DISCUSSION: 66 y.o. female admitted 3/27 with acute hepatic encephalopathy requiring mech ventilation  ASSESSMENT / PLAN:  GASTROINTESTINAL A:   Elevated ammonia. Nutrition. Hx cirrhosis with NASH (Followed by Dr. Marina GoodellPerry, last seen 2016), GERD, hiatal hernia, ? esophageal varices, IBS. P:   Continue lactulose and rifaximin per tube -titrate to 2-3 soft BM/d, drop to 15 bid NPO. Continue preadmission PPI, propranolol.  PULMONARY A: VDRF - due to inability to protect the airway in setting of AMS. Left HCAP ? aspiration Hx OSA (unsure of CPAP use). Tobacco dependence. Morbid obesity P:   SBTs as able -close to extubation when  secretions decrease & MS improves VAP prevention measures. Tobacco cessation counseling once extubated.  CARDIOVASCULAR A:  Hx HTN, HLD, PVD, CAD. P:  Continue preadmission plavix. Hold preadmission simvastatin, isosorbide, spironolactone.  RENAL A:   AoCKD stg 4, baseline cr 2.0 Hypomagnesemia - Pseudohypocalcemia - corrects to 8.8. P:   Dc IVFs Lasix 40 daily  HEMATOLOGIC A:   Thrombocytopenia. VTE Prophylaxis. P:  Monitor platelet counts. SCD's / lovenox. CBC in AM.  INFECTIOUS A:   HCAP ? aspiration, PCN allergic H flu  P:  Dc ceftaz / vanc  Change to ceftx   ENDOCRINE A:   Hx Hypothyroidism, uncontrolled DM. TSH nml P:   Continue preadmission synthroid SSI. Resumed lantus, novolog.   NEUROLOGIC A:   Acute hepatic encephalopathy P:   Sedation: dc  Propofol gtt , ct  Fentanyl PRN. RASS goal:  0 to -1. Daily WUA. Continue lactulose / rifaximin as above. Hold preadmission alprazolam, gabapentin.  Family updated: husband & daughter at bedside, guarded prognosis  Interdisciplinary Family Meeting v Palliative Care Meeting:  Due by: 08/17/16.  CC time: 35 min.  Cyril Mourningakesh Deron Poole MD. Tonny BollmanFCCP. [facility] Pager [phone] If no response call [phone]    08/14/2016, 11:14 AM

## 2016-08-14 NOTE — Care Management (Signed)
CM provided attending Prior Approval request form for Rifaximin (pt states she can not pay for drug - drug in on the non preferred list however can be considered if for hepatic enceph.

## 2016-08-14 NOTE — Procedures (Signed)
Extubation Procedure Note  Patient Details:   Name: Monica [name] DOB: 1950/11/07 MRN: [id number]   Airway Documentation:     Evaluation  O2 sats: stable throughout Complications: No apparent complications Patient did tolerate procedure well. Bilateral Breath Sounds: Stridor   Yes   Positive cuff leak noted. Pt placed on [place] 4 L with humidity, able to reach 500 using IS, upper airway stridor noted.  MD notified.   Forest BeckerJean S Aicha Clingenpeel 08/14/2016, 2:04 PM

## 2016-08-14 NOTE — Progress Notes (Signed)
Pharmacy Antibiotic Note  Monica [name] is a 66 y.o. female admitted on 08/12/2016 after being found unresponsive, urine drug screen + for benzodiazepines. Has NASH. Starting abx for possible aspiration pneumonia. WBC 9.1 > 20, currently afebrile.   Patient's renal function has worsened. Will decrease ceftazidime and vancomycin frequency.   Plan: Vancomycin 1500mg  IV q48h  Ceftazidime 2g IV q24h Monitor culture data, renal function and clinical course VT at SS prn   Height: 5\' 3"  (160 cm) Weight: 240 lb 11.9 oz (109.2 kg) IBW/kg (Calculated) : 52.4  Temp (24hrs), Avg:98.4 F (36.9 C), Min:98 F (36.7 C), Max:98.9 F (37.2 C)   Recent Labs Lab 08/12/16 1154 08/13/16 0244 08/13/16 1637 08/14/16 0347  WBC 9.1 20.0*  --  16.3*  CREATININE 2.12* 2.00* 2.22*  --     Estimated Creatinine Clearance: 29.6 mL/min (A) (by C-G formula based on SCr of 2.22 mg/dL (H)).    Allergies  Allergen Reactions  . Biaxin [Clarithromycin] Shortness Of Breath  . Gatifloxacin Shortness Of Breath  . Sulfamethoxazole Shortness Of Breath  . Sulfonamide Derivatives Shortness Of Breath  . Actos [Pioglitazone]     Reaction noted by PCP's office  . Augmentin [Amoxicillin-Pot Clavulanate]     Reaction not known by husband  . Farxiga [Dapagliflozin]     Reaction unknown by husband  . Invokana [Canagliflozin]     Reaction unknown by husband  . Latex Other (See Comments)    Reaction unknown by husband  . Metformin And Related     Reaction noted by PCP's office  . Tape Hives  . Penicillins Hives and Rash    Has patient had a PCN reaction causing immediate rash, facial/tongue/throat swelling, SOB or lightheadedness with hypotension: Yes Has patient had a PCN reaction causing severe rash involving mucus membranes or skin necrosis: No Has patient had a PCN reaction that required hospitalization: No Has patient had a PCN reaction occurring within the last 10 years: No If all of the above answers are  "NO", then may proceed with Cephalosporin use.    Antimicrobials this admission: 3/28 vancomycin > 3/28 ceftazidime >   Dose adjustments this admission: N/A   Microbiology results: 3/28 blood cx: 3/27 mrsa pcr: + 3/27 resp cx: ip  Monica [name], PharmD, BCPS Clinical Pharmacist [phone] 08/14/2016 7:56 AM

## 2016-08-14 NOTE — Progress Notes (Signed)
eLink Physician-Brief Progress Note Patient Name: Mel AlmondDiane Y Crawshaw DOB: May 02, 1951 MRN: [id number]   Date of Service  08/14/2016  HPI/Events of Note  Extubated today. NPO. However, the patient is on PO meds.   eICU Interventions  Will order: 1. NPO except meds with sips.      Intervention Category Intermediate Interventions: Other:  Lenell AntuSommer,Karon Heckendorn [place] 08/14/2016, 8:57 PM

## 2016-08-15 ENCOUNTER — Inpatient Hospital Stay (HOSPITAL_COMMUNITY): Payer: Medicare Other

## 2016-08-15 DIAGNOSIS — J14 Pneumonia due to Hemophilus influenzae: Secondary | ICD-10-CM | POA: Diagnosis present

## 2016-08-15 DIAGNOSIS — R609 Edema, unspecified: Secondary | ICD-10-CM

## 2016-08-15 LAB — GLUCOSE, CAPILLARY
GLUCOSE-CAPILLARY: 174 mg/dL — AB (ref 65–99)
GLUCOSE-CAPILLARY: 201 mg/dL — AB (ref 65–99)
Glucose-Capillary: 169 mg/dL — ABNORMAL HIGH (ref 65–99)
Glucose-Capillary: 174 mg/dL — ABNORMAL HIGH (ref 65–99)
Glucose-Capillary: 184 mg/dL — ABNORMAL HIGH (ref 65–99)
Glucose-Capillary: 252 mg/dL — ABNORMAL HIGH (ref 65–99)

## 2016-08-15 LAB — BASIC METABOLIC PANEL
ANION GAP: 10 (ref 5–15)
BUN: 46 mg/dL — AB (ref 6–20)
CHLORIDE: 112 mmol/L — AB (ref 101–111)
CO2: 19 mmol/L — ABNORMAL LOW (ref 22–32)
Calcium: 8.5 mg/dL — ABNORMAL LOW (ref 8.9–10.3)
Creatinine, Ser: 2.28 mg/dL — ABNORMAL HIGH (ref 0.44–1.00)
GFR, EST AFRICAN AMERICAN: 25 mL/min — AB (ref >60)
GFR, EST NON AFRICAN AMERICAN: 21 mL/min — AB (ref >60)
Glucose, Bld: 185 mg/dL — ABNORMAL HIGH (ref 65–99)
POTASSIUM: 3.6 mmol/L (ref 3.5–5.1)
SODIUM: 141 mmol/L (ref 135–145)

## 2016-08-15 LAB — CBC
HCT: 29.9 % — ABNORMAL LOW (ref 36.0–46.0)
HEMOGLOBIN: 9.8 g/dL — AB (ref 12.0–15.0)
MCH: 27.5 pg (ref 26.0–34.0)
MCHC: 32.8 g/dL (ref 30.0–36.0)
MCV: 83.8 fL (ref 78.0–100.0)
PLATELETS: 70 10*3/uL — AB (ref 150–400)
RBC: 3.57 MIL/uL — AB (ref 3.87–5.11)
RDW: 17.6 % — ABNORMAL HIGH (ref 11.5–15.5)
WBC: 9 10*3/uL (ref 4.0–10.5)

## 2016-08-15 LAB — PHOSPHORUS: PHOSPHORUS: 2.9 mg/dL (ref 2.5–4.6)

## 2016-08-15 LAB — CULTURE, BLOOD (ROUTINE X 2)

## 2016-08-15 LAB — MAGNESIUM: Magnesium: 1.6 mg/dL — ABNORMAL LOW (ref 1.7–2.4)

## 2016-08-15 MED ORDER — MAGNESIUM SULFATE 4 GM/100ML IV SOLN
4.0000 g | Freq: Once | INTRAVENOUS | Status: AC
  Administered 2016-08-15: 4 g via INTRAVENOUS
  Filled 2016-08-15: qty 100

## 2016-08-15 MED ORDER — PANTOPRAZOLE SODIUM 40 MG PO TBEC: 40.0000 mg | DELAYED_RELEASE_TABLET | Freq: Every day | ORAL | Status: DC

## 2016-08-15 MED ORDER — INSULIN ASPART 100 UNIT/ML ~~LOC~~ SOLN
0.0000 [IU] | Freq: Three times a day (TID) | SUBCUTANEOUS | Status: DC
  Administered 2016-08-15 – 2016-08-16 (×2): 8 [IU] via SUBCUTANEOUS
  Administered 2016-08-16: 5 [IU] via SUBCUTANEOUS
  Administered 2016-08-16: 8 [IU] via SUBCUTANEOUS
  Administered 2016-08-17: 3 [IU] via SUBCUTANEOUS
  Administered 2016-08-17 (×2): 5 [IU] via SUBCUTANEOUS
  Administered 2016-08-18: 3 [IU] via SUBCUTANEOUS
  Administered 2016-08-18: 8 [IU] via SUBCUTANEOUS

## 2016-08-15 MED ORDER — PANTOPRAZOLE SODIUM 40 MG PO TBEC
40.0000 mg | DELAYED_RELEASE_TABLET | Freq: Every day | ORAL | Status: DC
  Administered 2016-08-15 – 2016-08-18 (×4): 40 mg via ORAL
  Filled 2016-08-15 (×4): qty 1

## 2016-08-15 NOTE — Evaluation (Signed)
Physical Therapy Evaluation Patient Details Name: Monica [name] MRN: [id number] DOB: [birth year] Today's Date: 08/15/2016   History of Present Illness  Patient is a 66 yo female admitted 08/12/16 after being found unresponsive by husband.  Patient intubated 08/12/16.  Patient with acute hepatic encephalopathy, and pna.  Patient extubated 08/14/16.    PMH:  Admit 07/25/16-07/28/16 with acute hepatic encephalopathy, BLE edema, chest pain, HLD, HTN, anemia, blind Lt eye, decreased vision Rt eye, OA, obesity, PVD, SOB, tobacco use, DM, OSA, CAD, falls at home  Clinical Impression  Patient presents with problems listed below.  Will benefit from acute PT to maximize functional mobility prior to discharge.  Patient requiring Mod assist for bed mobility, and had multiple falls at home.  Recommend ST-SNF at d/c to continue therapy with goal to function at Min guard to min assist to return home with husband.    Follow Up Recommendations SNF;Supervision/Assistance - 24 hour    Equipment Recommendations  Wheelchair (measurements PT);Wheelchair cushion (measurements PT)    Recommendations for Other Services       Precautions / Restrictions Precautions Precautions: Fall Restrictions Weight Bearing Restrictions: No      Mobility  Bed Mobility Overal bed mobility: Needs Assistance Bed Mobility: Rolling;Sidelying to Sit Rolling: Mod assist Sidelying to sit: Mod assist;HOB elevated       General bed mobility comments: Cues to use bed rails.  Required mod assist to roll hips forward and move LE's toward EOB.  Used bed pad to scoot hips toward EOB and assist to raise trunk to upright sitting position.  Patient able to sit EOB x 15 minutes with min guard assist.  Patient requested to sit EOB to eat dinner (tray arrived during session).  NT and RN notified patient at EOB and will call when ready to lay down.  Transfers                 General transfer comment: NT  Ambulation/Gait                Stairs            Wheelchair Mobility    Modified Rankin (Stroke Patients Only)       Balance Overall balance assessment: Needs assistance;History of Falls Sitting-balance support: No upper extremity supported;Feet supported Sitting balance-Leahy Scale: Good                                       Pertinent Vitals/Pain Pain Assessment: No/denies pain    Home Living Family/patient expects to be discharged to:: Private residence Living Arrangements: Spouse/significant other Available Help at Discharge: Family;Available 24 hours/day Type of Home: Mobile home Home Access: Ramped entrance     Home Layout: One level Home Equipment: Walker - 2 wheels;Cane - single point;Bedside commode;Grab bars - toilet      Prior Function Level of Independence: Needs assistance   Gait / Transfers Assistance Needed: Uses RW "sometimes", assist with gait/transfers  ADL's / Homemaking Assistance Needed: Husband assists with showering, cooking, and assists with feeding at times  Comments: Patient was receiving HHPT and HH Aide pta.     Hand Dominance   Dominant Hand: Right    Extremity/Trunk Assessment   Upper Extremity Assessment Upper Extremity Assessment: Generalized weakness (Bil pins and needles in hands - neuropathy)    Lower Extremity Assessment Lower Extremity Assessment: Generalized weakness (Numbness bilateral feet - neuropathy)  Communication   Communication: No difficulties  Cognition Arousal/Alertness: Awake/alert Behavior During Therapy: Anxious (Focused on getting her dinner tray) Overall Cognitive Status: Impaired/Different from baseline Area of Impairment: Attention;Memory;Following commands;Problem solving                   Current Attention Level: Sustained Memory: Decreased short-term memory Following Commands: Follows one step commands inconsistently;Follows one step commands with increased time     Problem Solving:  Slow processing;Decreased initiation;Difficulty sequencing;Requires verbal cues        General Comments      Exercises     Assessment/Plan    PT Assessment Patient needs continued PT services  PT Problem List Decreased strength;Decreased activity tolerance;Decreased balance;Decreased mobility;Decreased cognition;Decreased knowledge of use of DME;Cardiopulmonary status limiting activity;Impaired sensation;Obesity       PT Treatment Interventions DME instruction;Gait training;Functional mobility training;Therapeutic activities;Therapeutic exercise;Balance training;Cognitive remediation;Patient/family education    PT Goals (Current goals can be found in the Care Plan section)  Acute Rehab PT Goals Patient Stated Goal: to go home PT Goal Formulation: With patient/family Time For Goal Achievement: 08/29/16 Potential to Achieve Goals: Fair    Frequency Min 3X/week   Barriers to discharge        Co-evaluation               End of Session   Activity Tolerance: Patient tolerated treatment well Patient left: in bed;with call bell/phone within reach;with family/visitor present (sitting EOB) Nurse Communication: Mobility status (sitting EOB for dinner. Will call for help to lay down) PT Visit Diagnosis: Repeated falls (R29.6);Muscle weakness (generalized) (M62.81);Difficulty in walking, not elsewhere classified (R26.2)    Time: 1610-9604 PT Time Calculation (min) (ACUTE ONLY): 36 min   Charges:   PT Evaluation $PT Eval Moderate Complexity: 1 Procedure PT Treatments $Therapeutic Activity: 8-22 mins   PT G Codes:        Durenda Hurt. Renaldo Fiddler, [facility] Acute Rehab Services Pager [phone]   Vena Austria 08/15/2016, 8:53 PM

## 2016-08-15 NOTE — Progress Notes (Signed)
eLink Physician-Brief Progress Note Patient Name: MARTIN SMEAL DOB: 01-28-1951 MRN: [id number]   Date of Service  08/15/2016  HPI/Events of Note  Hypomag  eICU Interventions  Mag replaced     Intervention Category Intermediate Interventions: Electrolyte abnormality - evaluation and management  Lindora Alviar 08/15/2016, 4:23 AM

## 2016-08-15 NOTE — Progress Notes (Signed)
*  PRELIMINARY RESULTS* Vascular Ultrasound Bilateral lower extremity venous duplex has been completed.  Preliminary findings: No evidence of deep vein thrombosis in the visualized veins of the lower extremities.  Somewhat difficult exam due to patient body habitus and poor positioning.  Negative for baker's cysts bilaterally.   Chauncey Fischer 08/15/2016, 4:17 PM

## 2016-08-15 NOTE — Progress Notes (Signed)
PULMONARY / CRITICAL CARE MEDICINE   Name: Monica [name] MRN: [id number] DOB: July 25, 1950    ADMISSION DATE:  08/12/2016 CONSULTATION DATE:  08/12/16  REFERRING MD:  Juleen China  CHIEF COMPLAINT:  AMS  brief  Monica [name] is a 66 y.o. female with PMH as outlined below. She was brought to [facility] ED 3/27 after being found in her bed unresponsive by husband around 11AM that morning. In ED, she required intubation for airway protection.  Ammonia elevated at 171, AoCKD (SCr 2.12), Mag 0.8, Osmolality 305, UDS positive for benzo's, CT head negative. Recent admission 3/9-3/12 for same issue. Per husband and daughter, has been compliant with lactulose   has a past medical history of Bilateral lower extremity edema; Carotid artery bruit; Chest pain (09/02/2012); Coronary atherosclerosis of unspecified type of vessel, native or graft; Endometrial polyp; Esophageal varices (HCC); GERD (gastroesophageal reflux disease); Hiatal hernia; Hyperlipidemia; Hypertension; Iron deficiency anemia; Irritable bowel syndrome; Macular degeneration (senile) of retina, unspecified; Obesity, unspecified; Osteoarthrosis, unspecified whether generalized or localized, unspecified site; PVD (peripheral vascular disease) (HCC); SOB (shortness of breath); Tobacco abuse; Type II or unspecified type diabetes mellitus without mention of complication, not stated as uncontrolled; and Unspecified sleep apnea.   has a past surgical history that includes Appendectomy; Cholecystectomy; Carotid endarterectomy; Cardiac catheterization (03/18/2007); Cardiac catheterization (07/01/2001); and Cardiac catheterization (05/17/1997).    STUDIES:  CXR 3/27 > negative. CT head 3/27 > negative.  CULTURES: Blood 3/27 > MRSE 1/2  Sputum 3/27 > H flu  Urine 3/27 >   ANTIBIOTICS: ceftaz 3/28  (aspiration) >> vanc 3/28 >>  SIGNIFICANT EVENTS: 3/27 > admit., ETT >  3/29 -   Critically ill, On vent Sedated on propofol gtt decreased  secretions afebrile     SUBJECTIVE/OVERNIGHT/INTERVAL HX 3/30 - reamins extubated. Oreinted. Eating. Deconditioned. Husband at bedside. Per RN: daughter concerned about frquent falls at home and ability to be independent. R report wheeze   reports that she has been smoking Cigarettes.  She has been smoking about 1.00 pack per day. She has never used smokeless tobacco.   VITAL SIGNS: BP 106/69   Pulse 60   Temp 97.8 F (36.6 C) (Oral)   Resp 18   Ht  (1.6 m)   Wt 107.5 kg (236 lb 15.9 oz)   SpO2 97%   BMI 41.98 kg/m   HEMODYNAMICS:    VENTILATOR SETTINGS:    INTAKE / OUTPUT: I/O last 3 completed shifts: In: 1566.7 [I.V.:696.7; NG/GT:720; IV Piggyback:150] Out: 3290 [Urine:2040; Stool:1250]   PHYSICAL EXAMINATION:  General Appearance:    Looks chronically OBESE - yes  Head:    Normocephalic, without obvious abnormality, atraumatic  Eyes:    PERRL - yes, conjunctiva/corneas - clear      Ears:    Normal external ear canals, both ears  Nose:   NG tube - no  Throat:  ETT TUBE - no , OG tube - no, eating +  Neck:   Supple,  No enlargement/tenderness/nodules     Lungs:     Bialteral hwheeze +  Chest wall:    No deformity  Heart:    S1 and S2 normal, no murmur, CVP - no.  Pressors - no  Abdomen:     Soft, no masses, no organomegaly  Genitalia:    Not done  Rectal:   not done  Extremities:   Extremities- weak diffusel with skin excoriatin     Skin:   Intact in exposed areas .  Neurologic:   Sedation - none -> RASS - +1 . Moves all 4s - yes but decodnitioned. CAM-ICU - neg for delirium . Orientation - x 3 +      LABS: PULMONARY  Recent Labs Lab 08/12/16 1348  PHART 7.321*  PCO2ART 40.0  PO2ART 385.0*  HCO3 20.9  TCO2 22  O2SAT 100.0    CBC  Recent Labs Lab 08/14/16 0347 08/14/16 0725 08/15/16 0256  HGB 10.1* 9.9* 9.8*  HCT 30.7* 30.4* 29.9*  WBC 16.3* 14.2* 9.0  PLT 69* 76* 70*    COAGULATION  Recent Labs Lab 08/12/16 1154  INR  1.50    CARDIAC   Recent Labs Lab 08/12/16 1154  TROPONINI <0.03   No results for input(s): PROBNP in the last 168 hours.   CHEMISTRY  Recent Labs Lab 08/12/16 1154 08/12/16 1616 08/13/16 0244 08/13/16 1637 08/14/16 1123 08/15/16 0256  NA 138  --  138 139 139 141  K 4.4  --  4.2 4.3 3.9 3.6  CL 109  --  109 112* 111 112*  CO2 19*  --  18* 16* 17* 19*  GLUCOSE 225*  --  241* 238* 299* 185*  BUN 28*  --  31* 35* 42* 46*  CREATININE 2.12*  --  2.00* 2.22* 2.28* 2.28*  CALCIUM 7.6*  --  7.7* 7.8* 8.1* 8.5*  MG 0.8* 1.2* 1.5*  --  1.7 1.6*  PHOS  --   --  3.0  --  2.8 2.9   Estimated Creatinine Clearance: 28.5 mL/min (A) (by C-G formula based on SCr of 2.28 mg/dL (H)).   LIVER  Recent Labs Lab 08/12/16 1154 08/14/16 1123  AST 25 23  ALT 19 17  ALKPHOS 111 89  BILITOT 0.9 1.0  PROT 5.2* 5.1*  ALBUMIN 2.4* 2.3*  INR 1.50  --      INFECTIOUS  Recent Labs Lab 08/12/16 1154  PROCALCITON <0.10     ENDOCRINE CBG (last 3)   Recent Labs  08/15/16 0346 08/15/16 0812 08/15/16 1201  GLUCAP 174* 169* 174*         IMAGING x48h  - image(s) personally visualized  -   highlighted in bold Dg Chest Port 1 View  Result Date: 08/15/2016 CLINICAL DATA:  Respiratory failure EXAM: PORTABLE CHEST 1 VIEW COMPARISON:  08/14/2016 FINDINGS: Cardiac shadow is stable. The endotracheal tube and nasogastric catheter have been removed in the interval. Mild vascular congestion is noted without interstitial edema. No focal infiltrate is seen. No sizable effusion is noted. IMPRESSION: Vascular congestion without interstitial edema. Electronically Signed   By: Alcide Clever M.D.   On: 08/15/2016 07:27   Dg Chest Port 1 View  Result Date: 08/14/2016 CLINICAL DATA:  Acute respiratory failure EXAM: PORTABLE CHEST 1 VIEW COMPARISON:  Portable chest x-ray of August 13, 2016 FINDINGS: The patient is rotated on today's study. The lungs are well-expanded. The basilar lung markings on  the right remain coarse. On the left the interstitial markings in the mid and lower lung remain increased. The left hemidiaphragm is better demonstrated today. The heart is top-normal in size. The central pulmonary vascularity is prominent. The endotracheal tube tip lies 2.7 cm above the carina. The esophagogastric tube tip projects below the inferior margin of the image. IMPRESSION: Allowing for differences in positioning there has not been significant interval change in the appearance of the chest. Persistent patchy interstitial infiltrates in the left mid and lower lung and at the right lung base. The support tubes  are in reasonable position. Electronically Signed   By: David  [place] M.D.   On: 08/14/2016 07:31       DISCUSSION: 66 y.o. female admitted 3/27 with acute hepatic encephalopathy requiring mech ventilation  ASSESSMENT / PLAN:  GASTROINTESTINAL A:   Elevated ammonia. Nutrition. Hx cirrhosis with NASH (Followed by Dr. Marina Goodell, last seen 2016), GERD, hiatal hernia, ? esophageal varices, IBS.  3/30 -  Improved encephlaopathy  P:   Continue lactulose and rifaximin per tube -titrate to 2-3 soft BM/d, drop to 15 bid NPO. Continue preadmission PPI, propranolol.  PULMONARY A: VDRF - due to inability to protect the airway in setting of AMS. Left HCAP ? aspiration Hx OSA (unsure of CPAP use). Tobacco dependence. Morbid obesity  3/30 - remains extubated  P:   HAP prevention measures. Tobacco cessation counseling once extubated.  CARDIOVASCULAR A:  Hx HTN, HLD, PVD, CAD.  3/30 - not on pressors P:  Continue preadmission plavix. Hold preadmission simvastatin, isosorbide, spironolactone.  RENAL  Recent Labs Lab 08/12/16 1154 08/13/16 0244 08/13/16 1637 08/14/16 1123 08/15/16 0256  CREATININE 2.12* 2.00* 2.22* 2.28* 2.28*     A:   AoCKD stg 4, baseline cr 2.0  Hypomagnesemia -  P:   Replete mag Dc IVFs Lasix 40 daily   HEMATOLOGIC  Recent Labs Lab  08/12/16 1154 08/13/16 0244 08/14/16 0347 08/14/16 0725 08/15/16 0256  PLT 107* 79* 69* 76* 70*    A:   Thrombocytopenia (baseline 115-135). VTE Prophylaxis. Anemia   - plat 70s and dropping P:  Stop lovenox Check HITT and duplex  - TRH to follow reults SCD's / lovenox. CBC in AM. PRBC for hgb < 7gm%  INFECTIOUS A:   HCAP ? aspiration, PCN allergic H flu pneumonia P:   Dc ceftaz / vanc  Change to ceftrizone   ENDOCRINE A:   Hx Hypothyroidism, uncontrolled DM. TSH nml P:   Continue preadmission synthroid SSI. Resumed lantus, novolog.   NEUROLOGIC A:   Acute hepatic encephalopathy   - 3/30 - resolved but sluggish with speech P:   RASS goal: 0 to -1. Continue lactulose / rifaximin as above. Hold preadmission alprazolam, gabapentin.  Family updated: husband  at bedside, 08/15/16  Interdisciplinary Family Meeting v Palliative Care Meeting:  Due by: 08/17/16.   DISPO Move to floor under triad with ccm off./ She is deconditioned . PT consult - ? SNF placement      Dr. Kalman Shan, M.D., [place].C.P Pulmonary and Critical Care Medicine Staff Physician [facility] Pager: [phone], If no answer or between  15:00h - 7:00h: call [phone]  08/15/2016 2:02 PM

## 2016-08-16 ENCOUNTER — Inpatient Hospital Stay (HOSPITAL_COMMUNITY): Payer: Medicare Other

## 2016-08-16 DIAGNOSIS — Z4659 Encounter for fitting and adjustment of other gastrointestinal appliance and device: Secondary | ICD-10-CM

## 2016-08-16 DIAGNOSIS — R600 Localized edema: Secondary | ICD-10-CM

## 2016-08-16 DIAGNOSIS — R402432 Glasgow coma scale score 3-8, at arrival to emergency department: Secondary | ICD-10-CM

## 2016-08-16 DIAGNOSIS — J14 Pneumonia due to Hemophilus influenzae: Secondary | ICD-10-CM

## 2016-08-16 DIAGNOSIS — K7290 Hepatic failure, unspecified without coma: Secondary | ICD-10-CM

## 2016-08-16 DIAGNOSIS — J9601 Acute respiratory failure with hypoxia: Secondary | ICD-10-CM

## 2016-08-16 DIAGNOSIS — K7469 Other cirrhosis of liver: Secondary | ICD-10-CM

## 2016-08-16 LAB — GLUCOSE, CAPILLARY
GLUCOSE-CAPILLARY: 239 mg/dL — AB (ref 65–99)
Glucose-Capillary: 166 mg/dL — ABNORMAL HIGH (ref 65–99)
Glucose-Capillary: 264 mg/dL — ABNORMAL HIGH (ref 65–99)
Glucose-Capillary: 282 mg/dL — ABNORMAL HIGH (ref 65–99)

## 2016-08-16 LAB — HEPARIN INDUCED PLATELET AB (HIT ANTIBODY): Heparin Induced Plt Ab: 0.176 OD (ref 0.000–0.400)

## 2016-08-16 MED ORDER — FUROSEMIDE 10 MG/ML IJ SOLN
40.0000 mg | Freq: Three times a day (TID) | INTRAMUSCULAR | Status: DC
  Administered 2016-08-16 – 2016-08-18 (×5): 40 mg via INTRAVENOUS
  Filled 2016-08-16 (×5): qty 4

## 2016-08-16 MED ORDER — POTASSIUM CHLORIDE CRYS ER 20 MEQ PO TBCR
60.0000 meq | EXTENDED_RELEASE_TABLET | Freq: Once | ORAL | Status: AC
  Administered 2016-08-16: 60 meq via ORAL
  Filled 2016-08-16: qty 3

## 2016-08-16 MED ORDER — MAGNESIUM SULFATE 2 GM/50ML IV SOLN
2.0000 g | Freq: Four times a day (QID) | INTRAVENOUS | Status: AC
  Administered 2016-08-16 (×2): 2 g via INTRAVENOUS
  Filled 2016-08-16 (×4): qty 50

## 2016-08-16 MED ORDER — MAGNESIUM SULFATE 2 GM/50ML IV SOLN: 2.0000 g | Freq: Once | INTRAVENOUS | Status: DC

## 2016-08-16 NOTE — Progress Notes (Signed)
Modified Barium Swallow Progress Note  Patient Details  Name: Monica [name] MRN: [id number] Date of Birth: 04-15-1951  Today's Date: 08/16/2016  Modified Barium Swallow completed.  Full report located under Chart Review in the Imaging Section.  Brief recommendations include the following:  Clinical Impression  Patient presents with mild-moderate oropharyngeal dysphagia and suspected primary esophageal dysphagia. Deviations in swallow physiology include reduced pharyngeal peristalsis, with moderate pyriform residue with thin liquids, suspect from suboptimal pressures for complete bolus clearance through the UES. Oral stage of swallowing characterized by lingual pumping, reduced bolus cohesion and piecemeal deglutition with solids. Swallow initation delayed to the pyriform sinuses with thin liquids, valleculae with other consistencies. With thin liquids, penetration during the swallow x2 due to delayed initiation and of residue after the swallow x1. Pt does not sense penetration, but a cued throat clear reduces penetrated material. Small cup sips were better retained in the valleculae and airway protection was consistently adequate with this strategy. Heavier boluses improved clearance through the UES; minimal solid residues noted. With mixed consistency (barium tablet and thin liquid), pt with prolonged bolus holding in the oral cavity, premature spillage to pyriform sinuses resulting in penetration during the swallow. An esophageal sweep was performed in the upright position. The barium tablet paused intermittently in the proximal and mid-thoracic esophagus, and was remarkable for dysmotility per radiologist. Distal esophagus could not be visualized due to patient's size which limited movement of the equipment. Following the assessment, patient began coughing when reclined to transfer to bed, stated "things get stuck" and "I cough this phlegm up". She endorsed foreign body sensation at sternum. Given the  above, recommend pt continue her baseline soft diet (dys 3) with thin liquids via single cup sips only (no straws), medications whole in puree. She may benefit from alternating solids and liquids to improve bolus clearance through the esophagus. She remains at risk for aspiration given esophageal component; may benefit from esophageal assessment to assess pressures, GI referral.   Swallow Evaluation Recommendations   Recommended Consults: Consider esophageal assessment;Consider GI evaluation   SLP Diet Recommendations: Dysphagia 3 (Mech soft) solids;Thin liquid   Liquid Administration via: No straw;Cup   Medication Administration: Whole meds with puree   Supervision: Intermittent supervision to cue for compensatory strategies   Compensations: Minimize environmental distractions;Slow rate;Small sips/bites   Postural Changes: Remain semi-upright after after feeds/meals (Comment);Seated upright at 90 degrees   Oral Care Recommendations: Oral care BID     Rondel Baton, [place] CF-SLP Speech-Language Pathologist [phone]   Monica [name] 08/16/2016,12:12 PM

## 2016-08-16 NOTE — Progress Notes (Signed)
PROGRESS NOTE  Monica [name]  ZOX:[id number] DOB: 1950/12/02 DOA: 08/12/2016 PCP: Galvin Proffer, MD Outpatient Specialists:  Subjective: Seen with her husband bedside, mildly short of breath, has +2 lower extremity edema.  Brief Narrative:  Monica [name] is a 66 y.o. female with PMH as outlined below. She was brought to [facility] ED 3/27 after being found in her bed unresponsive by husband around 11AM that morning. In ED, she required intubation for airway protection.  Ammonia elevated at 171, AoCKD (SCr 2.12), Mag 0.8, Osmolality 305, UDS positive for benzo's, CT head negative. Recent admission 3/9-3/12 for same issue. Per husband and daughter, has been compliant with lactulose   has a past medical history of Bilateral lower extremity edema; Carotid artery bruit; Chest pain (09/02/2012); Coronary atherosclerosis of unspecified type of vessel, native or graft; Endometrial polyp; Esophageal varices (HCC); GERD (gastroesophageal reflux disease); Hiatal hernia; Hyperlipidemia; Hypertension; Iron deficiency anemia; Irritable bowel syndrome; Macular degeneration (senile) of retina, unspecified; Obesity, unspecified; Osteoarthrosis, unspecified whether generalized or localized, unspecified site; PVD (peripheral vascular disease) (HCC); SOB (shortness of breath); Tobacco abuse; Type II or unspecified type diabetes mellitus without mention of complication, not stated as uncontrolled; and Unspecified sleep apnea.  Assessment & Plan:   Principal Problem:   Acute respiratory failure with hypoxia (HCC) Active Problems:   Bilateral lower extremity edema   Ascites   Hepatic cirrhosis (HCC)   Hepatic encephalopathy (HCC)   Acute encephalopathy   Pneumonia due to hemophilus influenzae (HCC)   Acute respiratory failure with hypoxia -Required intubation and mechanical ventilation, patient admitted to the hospital with somnolence unable to protect her airway. -Intubated in the ED, mechanically ventilated  admitted to the ICU, extubated on 08/14/2016. -Respiratory failure with hypoxia secondary to pneumonia and she was severely encephalopathic.  Acute hepatic encephalopathy -Admitted with somnolence, has history of hepatic cirrhosis and hepatic encephalopathy, ammonia level was 171. -Treated with lactulose and Xifaxan. -She is back to her baseline she is conversant and awake, alert and oriented 3.  Haemophilus influenzae Pneumonia -CXR on admission was clear, she developed interstitial infiltrates after intubation. -Developed leukocytosis and low-grade fever while she was in the hospital. -Tracheal aspirate culture showed Haemophilus influenzae, currently on Rocephin, continue. -1/2 blood cultures showed coagulase-negative staph likely contaminant.  Fluid overload -Patient has +2 pedal edema up to her thighs, she was on Lasix at home this was held on admission and given IV fluids. -Start aggressive diuresis.  Hypomagnesemia -Replete with IV supplements.  CKD stage III -She is around her baseline of creatinine 2.2, creatinine was 1.99 on discharge from the hospital on 3/12.  [name] with hepatic cirrhosis and esophageal varices -Fluid overloaded, hepatic encephalopathy but no evidence of bleeding currently.  Diabetes mellitus type 2 -Uncontrolled diabetes mellitus with hyperglycemia, hemoglobin A1c is 5.8 which correlate with mean plasma glucose of 197. -Carbohydrate modified diet and SSI.   DVT prophylaxis:  Code Status: Full Code Family Communication:  Disposition Plan:  Diet: DIET SOFT Room service appropriate? Yes; Fluid consistency: Thin  Consultants:   Was under PCCM until 3/30  Procedures:   Intubation/extubation  Antimicrobials:   ceftriaxone   Objective: Vitals:   08/15/16 2110 08/15/16 2239 08/16/16 0242 08/16/16 0522  BP: (!) 121/40   (!) 115/56  Pulse: 73   66  Resp: 19   (!) 21  Temp: 98.4 F (36.9 C)   97.5 F (36.4 C)  TempSrc:    Oral  SpO2: 94%    96%  Weight:  105  kg (231 lb 8 oz) 105.3 kg (232 lb 2 oz)   Height:        Intake/Output Summary (Last 24 hours) at 08/16/16 1206 Last data filed at 08/16/16 0420  Gross per 24 hour  Intake              380 ml  Output              500 ml  Net             -120 ml   Filed Weights   08/15/16 1605 08/15/16 2239 08/16/16 0242  Weight: 105 kg (231 lb 8 oz) 105 kg (231 lb 8 oz) 105.3 kg (232 lb 2 oz)    Examination: General exam: Appears calm and comfortable  Respiratory system: Clear to auscultation. Respiratory effort normal. Cardiovascular system: S1 & S2 heard, RRR. No JVD, murmurs, rubs, gallops or clicks. No pedal edema. Gastrointestinal system: Abdomen is nondistended, soft and nontender. No organomegaly or masses felt. Normal bowel sounds heard. Central nervous system: Alert and oriented. No focal neurological deficits. Extremities: Symmetric 5 x 5 power. Skin: No rashes, lesions or ulcers Psychiatry: Judgement and insight appear normal. Mood & affect appropriate.   Data Reviewed: I have personally reviewed following labs and imaging studies  CBC:  Recent Labs Lab 08/12/16 1154 08/13/16 0244 08/14/16 0347 08/14/16 0725 08/15/16 0256  WBC 9.1 20.0* 16.3* 14.2* 9.0  HGB 10.2* 11.1* 10.1* 9.9* 9.8*  HCT 31.7* 33.9* 30.7* 30.4* 29.9*  MCV 82.6 83.5 83.7 83.7 83.8  PLT 107* 79* 69* 76* 70*   Basic Metabolic Panel:  Recent Labs Lab 08/12/16 1154 08/12/16 1616 08/13/16 0244 08/13/16 1637 08/14/16 1123 08/15/16 0256  NA 138  --  138 139 139 141  K 4.4  --  4.2 4.3 3.9 3.6  CL 109  --  109 112* 111 112*  CO2 19*  --  18* 16* 17* 19*  GLUCOSE 225*  --  241* 238* 299* 185*  BUN 28*  --  31* 35* 42* 46*  CREATININE 2.12*  --  2.00* 2.22* 2.28* 2.28*  CALCIUM 7.6*  --  7.7* 7.8* 8.1* 8.5*  MG 0.8* 1.2* 1.5*  --  1.7 1.6*  PHOS  --   --  3.0  --  2.8 2.9   GFR: Estimated Creatinine Clearance: 28.2 mL/min (A) (by C-G formula based on SCr of 2.28 mg/dL (H)). Liver  Function Tests:  Recent Labs Lab 08/12/16 1154 08/14/16 1123  AST 25 23  ALT 19 17  ALKPHOS 111 89  BILITOT 0.9 1.0  PROT 5.2* 5.1*  ALBUMIN 2.4* 2.3*   No results for input(s): LIPASE, AMYLASE in the last 168 hours.  Recent Labs Lab 08/12/16 1155  AMMONIA 171*   Coagulation Profile:  Recent Labs Lab 08/12/16 1154  INR 1.50   Cardiac Enzymes:  Recent Labs Lab 08/12/16 1154  TROPONINI <0.03   BNP (last 3 results) No results for input(s): PROBNP in the last 8760 hours. HbA1C: No results for input(s): HGBA1C in the last 72 hours. CBG:  Recent Labs Lab 08/15/16 1604 08/15/16 1748 08/15/16 2110 08/16/16 0830 08/16/16 1146  GLUCAP 201* 184* 252* 166* 264*   Lipid Profile: No results for input(s): CHOL, HDL, LDLCALC, TRIG, CHOLHDL, LDLDIRECT in the last 72 hours. Thyroid Function Tests: No results for input(s): TSH, T4TOTAL, FREET4, T3FREE, THYROIDAB in the last 72 hours. Anemia Panel: No results for input(s): VITAMINB12, FOLATE, FERRITIN, TIBC, IRON, RETICCTPCT in the last 72  hours. Urine analysis:    Component Value Date/Time   COLORURINE YELLOW 08/12/2016 1305   APPEARANCEUR CLEAR 08/12/2016 1305   LABSPEC 1.012 08/12/2016 1305   PHURINE 6.0 08/12/2016 1305   GLUCOSEU NEGATIVE 08/12/2016 1305   GLUCOSEU NEGATIVE 08/31/2013 1516   HGBUR NEGATIVE 08/12/2016 1305   BILIRUBINUR NEGATIVE 08/12/2016 1305   KETONESUR NEGATIVE 08/12/2016 1305   PROTEINUR NEGATIVE 08/12/2016 1305   UROBILINOGEN 0.2 08/31/2013 1516   NITRITE NEGATIVE 08/12/2016 1305   LEUKOCYTESUR NEGATIVE 08/12/2016 1305   Sepsis Labs: (procalcitonin:4,lacticidven:4)  ) Recent Results (from the past 240 hour(s))  MRSA PCR Screening     Status: Abnormal   Collection Time: 08/12/16  6:47 PM  Result Value Ref Range Status   MRSA by PCR POSITIVE (A) NEGATIVE Final    Comment:        The GeneXpert MRSA Assay (FDA approved for NASAL specimens only), is one component of  a comprehensive MRSA colonization surveillance program. It is not intended to diagnose MRSA infection nor to guide or monitor treatment for MRSA infections. RESULT CALLED TO, READ BACK BY AND VERIFIED WITH: M GRIFFIN,RN  08/13/16 MKELLY,MLT   Culture, respiratory (NON-Expectorated)     Status: None   Collection Time: 08/12/16  6:50 PM  Result Value Ref Range Status   Specimen Description TRACHEAL ASPIRATE  Final   Special Requests NONE  Final   Gram Stain   Final    ABUNDANT WBC PRESENT, PREDOMINANTLY PMN ABUNDANT GRAM NEGATIVE RODS FEW GRAM POSITIVE COCCI IN PAIRS RARE GRAM POSITIVE RODS RARE GRAM NEGATIVE COCCI IN PAIRS    Culture   Final    ABUNDANT HAEMOPHILUS INFLUENZAE BETA LACTAMASE NEGATIVE    Report Status 08/14/2016 FINAL  Final  Culture, blood (Routine X 2) w Reflex to ID Panel     Status: None (Preliminary result)   Collection Time: 08/13/16 10:25 AM  Result Value Ref Range Status   Specimen Description BLOOD LEFT HAND  Final   Special Requests BOTTLES DRAWN AEROBIC ONLY 6CC  Final   Culture NO GROWTH 2 DAYS  Final   Report Status PENDING  Incomplete  Culture, blood (Routine X 2) w Reflex to ID Panel     Status: Abnormal   Collection Time: 08/13/16 10:32 AM  Result Value Ref Range Status   Specimen Description BLOOD RIGHT HAND  Final   Special Requests IN PEDIATRIC BOTTLE 3CC  Final   Culture  Setup Time   Final    GRAM POSITIVE COCCI IN CLUSTERS IN PEDIATRIC BOTTLE CRITICAL RESULT CALLED TO, READ BACK BY AND VERIFIED WITH: Veronda Prude.D. 15:00 08/14/16 ([email or web address])    Culture (A)  Final    STAPHYLOCOCCUS SPECIES (COAGULASE NEGATIVE) THE SIGNIFICANCE OF ISOLATING THIS ORGANISM FROM A SINGLE SET OF BLOOD CULTURES WHEN MULTIPLE SETS ARE DRAWN IS UNCERTAIN. PLEASE NOTIFY THE MICROBIOLOGY DEPARTMENT WITHIN ONE WEEK IF SPECIATION AND SENSITIVITIES ARE REQUIRED.    Report Status 08/15/2016 FINAL  Final  Blood Culture ID Panel (Reflexed)     Status: Abnormal    Collection Time: 08/13/16 10:32 AM  Result Value Ref Range Status   Enterococcus species NOT DETECTED NOT DETECTED Final   Listeria monocytogenes NOT DETECTED NOT DETECTED Final   Staphylococcus species DETECTED (A) NOT DETECTED Final    Comment: Methicillin (oxacillin) susceptible coagulase negative staphylococcus. Possible blood culture contaminant (unless isolated from more than one blood culture draw or clinical case suggests pathogenicity). No antibiotic treatment is indicated for blood  culture contaminants. CRITICAL RESULT  CALLED TO, READ BACK BY AND VERIFIED WITH: Veronda Prude.D. 15:00 08/14/16 ([email or web address])    Staphylococcus aureus NOT DETECTED NOT DETECTED Final   Methicillin resistance NOT DETECTED NOT DETECTED Final   Streptococcus species NOT DETECTED NOT DETECTED Final   Streptococcus agalactiae NOT DETECTED NOT DETECTED Final   Streptococcus pneumoniae NOT DETECTED NOT DETECTED Final   Streptococcus pyogenes NOT DETECTED NOT DETECTED Final   Acinetobacter baumannii NOT DETECTED NOT DETECTED Final   Enterobacteriaceae species NOT DETECTED NOT DETECTED Final   Enterobacter cloacae complex NOT DETECTED NOT DETECTED Final   Escherichia coli NOT DETECTED NOT DETECTED Final   Klebsiella oxytoca NOT DETECTED NOT DETECTED Final   Klebsiella pneumoniae NOT DETECTED NOT DETECTED Final   Proteus species NOT DETECTED NOT DETECTED Final   Serratia marcescens NOT DETECTED NOT DETECTED Final   Haemophilus influenzae NOT DETECTED NOT DETECTED Final   Neisseria meningitidis NOT DETECTED NOT DETECTED Final   Pseudomonas aeruginosa NOT DETECTED NOT DETECTED Final   Candida albicans NOT DETECTED NOT DETECTED Final   Candida glabrata NOT DETECTED NOT DETECTED Final   Candida krusei NOT DETECTED NOT DETECTED Final   Candida parapsilosis NOT DETECTED NOT DETECTED Final   Candida tropicalis NOT DETECTED NOT DETECTED Final     Invalid input(s): PROCALCITONIN, LACTICACIDVEN   Radiology  Studies: Dg Chest Port 1 View  Result Date: 08/15/2016 CLINICAL DATA:  Respiratory failure EXAM: PORTABLE CHEST 1 VIEW COMPARISON:  08/14/2016 FINDINGS: Cardiac shadow is stable. The endotracheal tube and nasogastric catheter have been removed in the interval. Mild vascular congestion is noted without interstitial edema. No focal infiltrate is seen. No sizable effusion is noted. IMPRESSION: Vascular congestion without interstitial edema. Electronically Signed   By: Alcide Clever M.D.   On: 08/15/2016 07:27        Scheduled Meds: . cefTRIAXone (ROCEPHIN)  IV  1 g Intravenous Q24H  . clopidogrel  75 mg Per Tube Daily  . furosemide  40 mg Intravenous Q12H  . insulin aspart  0-15 Units Subcutaneous TID WC & HS  . insulin glargine  15 Units Subcutaneous BID  . lactulose  15 g Per Tube BID  . levothyroxine  75 mcg Oral QAC breakfast  . mupirocin ointment   Nasal BID  . pantoprazole  40 mg Oral Q1200  . propranolol  60 mg Per Tube Daily  . rifaximin  550 mg Per Tube BID   Continuous Infusions:   LOS: 4 days    Time spent: 35 minutes    Bettina Warn A, MD [facility] Pager [phone]  If 7PM-7AM, please contact night-coverage [email or web address] Password [facility] 08/16/2016, 12:06 PM

## 2016-08-16 NOTE — Evaluation (Signed)
Clinical/Bedside Swallow Evaluation Patient Details  Name: Monica [name] MRN: [id number] Date of Birth: 08-25-1950  Today's Date: 08/16/2016 Time: SLP Start Time (ACUTE ONLY): 0810 SLP Stop Time (ACUTE ONLY): 0825 SLP Time Calculation (min) (ACUTE ONLY): 15 min  Past Medical History:  Past Medical History:  Diagnosis Date  . Bilateral lower extremity edema   . Carotid artery bruit    Doppler 07/09/2011 - abnormal study, no change from previous  . Chest pain 09/02/2012   Normal, EF-67  . Coronary atherosclerosis of unspecified type of vessel, native or graft   . Endometrial polyp   . Esophageal varices (HCC)   . GERD (gastroesophageal reflux disease)   . Hiatal hernia   . Hyperlipidemia   . Hypertension   . Iron deficiency anemia   . Irritable bowel syndrome   . Macular degeneration (senile) of retina, unspecified   . Obesity, unspecified   . Osteoarthrosis, unspecified whether generalized or localized, unspecified site   . 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 SOB (shortness of breath)    Normal 2D echo - EF-58, 03/18/2001  . Tobacco abuse   . Type II or unspecified type diabetes mellitus without mention of complication, not stated as uncontrolled   . Unspecified sleep apnea    Past Surgical History:  Past Surgical History:  Procedure Laterality Date  . APPENDECTOMY    . CARDIAC CATHETERIZATION  03/18/2007   Normal coronary arteries and LV function  . CARDIAC CATHETERIZATION  07/01/2001   Normal cath  . CARDIAC CATHETERIZATION  05/17/1997   Normal cath  . CAROTID ENDARTERECTOMY    . CHOLECYSTECTOMY     HPI:  Patient is a 66 yo female admitted 08/12/16 after being found unresponsive by husband. Patient intubated 08/12/16. Patient with acute hepatic encephalopathy, and pna. Patient extubated 08/14/16. PMH: GERD, hiatal hernia, ?esophageal varices, IBS, cirrhosis with NASH, Admit 07/25/16-07/28/16  with acute hepatic encephalopathy, BLE edema, chest pain, HLD, HTN, anemia, blind Lt eye, decreased vision Rt eye, OA, obesity, PVD, SOB, tobacco use, DM, OSA, CAD, falls at home. Bedside swallowing evaluation 07/26/16 with findings of oropharyngeal swallowing function which appears grossly within functional limits at bedside for baseline diet (soft foods with thin liquids). Mild aspiration risk given dentition, history of GERD, esophageal history. CXR 08/14/16 Persistent patchy interstitial infiltrates in the left mid and lower lung and at the right lung base.    Assessment / Plan / Recommendation Clinical Impression  Patient presents with moderate risk for aspiration given esophageal history, pneumonia with ?aspiration component per MD, and recent intubation. Initially swallows 3 oz water in consecutive sips with no overt signs of aspiration. However, as bedside assessment progressed, pt with delayed, wet coughing following thin liquid wash of regular solid, suggestive of reduced airway protection. Suspect esophageal component given pt's history. Recommend MBS to rule out aspiration, visualize swallowing function and pharyngoesophageal segment. Recommend continuing current diet pending MBS; further recommendations to follow instrumental assessment.  SLP Visit Diagnosis: Dysphagia, unspecified (R13.10)    Aspiration Risk  Moderate aspiration risk    Diet Recommendation Dysphagia 3 (Mech soft);Thin liquid   Liquid Administration via: Cup;Straw Medication Administration: Crushed with puree Supervision: Patient able to self feed Compensations: Minimize environmental distractions;Slow rate;Small sips/bites Postural Changes: Seated upright at 90 degrees;Remain upright for at least 30 minutes after po intake    Other  Recommendations Oral Care Recommendations: Oral care BID   Follow up Recommendations  Other (comment) (TBD)      Frequency and Duration            Prognosis Prognosis for Safe Diet  Advancement: Good      Swallow Study   General Date of Onset: 08/12/16 HPI: Patient is a 66 yo female admitted 08/12/16 after being found unresponsive by husband. Patient intubated 08/12/16. Patient with acute hepatic encephalopathy, and pna. Patient extubated 08/14/16. PMH: GERD, hiatal hernia, ?esophageal varices, IBS, cirrhosis with NASH, Admit 07/25/16-07/28/16 with acute hepatic encephalopathy, BLE edema, chest pain, HLD, HTN, anemia, blind Lt eye, decreased vision Rt eye, OA, obesity, PVD, SOB, tobacco use, DM, OSA, CAD, falls at home. Bedside swallowing evaluation 07/26/16 with findings of oropharyngeal swallowing function which appears grossly within functional limits at bedside for baseline diet (soft foods with thin liquids). Mild aspiration risk given dentition, history of GERD, esophageal history. CXR 08/14/16 Persistent patchy interstitial infiltrates in the left mid and lower lung and at the right lung base.  Type of Study: Bedside Swallow Evaluation Previous Swallow Assessment: see HPI Diet Prior to this Study: Dysphagia 3 (soft);Thin liquids Temperature Spikes Noted: No Respiratory Status: Room air History of Recent Intubation: Yes Length of Intubations (days): 2 days Date extubated: 08/14/16 Behavior/Cognition: Alert;Cooperative Oral Cavity Assessment: Within Functional Limits Oral Care Completed by SLP: No Oral Cavity - Dentition: Edentulous Vision: Impaired for self-feeding Self-Feeding Abilities: Able to feed self Patient Positioning: Upright in bed Baseline Vocal Quality: Hoarse Volitional Cough: Weak;Congested Volitional Swallow: Able to elicit    Oral/Motor/Sensory Function Overall Oral Motor/Sensory Function: Mild impairment Facial ROM: Within Functional Limits Facial Symmetry: Within Functional Limits Facial Strength: Within Functional Limits Facial Sensation: Within Functional Limits Lingual ROM: Within Functional Limits Lingual Symmetry: Abnormal symmetry  right Lingual Strength: Within Functional Limits Lingual Sensation: Within Functional Limits Velum: Within Functional Limits Mandible: Within Functional Limits   Ice Chips Ice chips: Within functional limits   Thin Liquid Thin Liquid: Impaired Presentation: Cup;Self Fed;Straw Pharyngeal  Phase Impairments: Cough - Delayed    Nectar Thick Nectar Thick Liquid: Not tested   Honey Thick Honey Thick Liquid: Not tested   Puree Puree: Within functional limits   Solid   GO   Solid: Impaired Presentation: Self Fed Oral Phase Impairments: Impaired mastication Oral Phase Functional Implications: Impaired mastication Pharyngeal Phase Impairments: Cough - Delayed       Rondel Baton, MS CF-SLP Speech-Language Pathologist  Arlana Lindau 08/16/2016,8:50 AM

## 2016-08-17 LAB — BASIC METABOLIC PANEL
Anion gap: 10 (ref 5–15)
BUN: 49 mg/dL — AB (ref 6–20)
CO2: 21 mmol/L — ABNORMAL LOW (ref 22–32)
Calcium: 8.3 mg/dL — ABNORMAL LOW (ref 8.9–10.3)
Chloride: 104 mmol/L (ref 101–111)
Creatinine, Ser: 2.18 mg/dL — ABNORMAL HIGH (ref 0.44–1.00)
GFR calc Af Amer: 26 mL/min — ABNORMAL LOW (ref >60)
GFR calc non Af Amer: 22 mL/min — ABNORMAL LOW (ref >60)
Glucose, Bld: 225 mg/dL — ABNORMAL HIGH (ref 65–99)
POTASSIUM: 3.8 mmol/L (ref 3.5–5.1)
SODIUM: 135 mmol/L (ref 135–145)

## 2016-08-17 LAB — MAGNESIUM: Magnesium: 2.2 mg/dL (ref 1.7–2.4)

## 2016-08-17 LAB — CBC
HEMATOCRIT: 28.1 % — AB (ref 36.0–46.0)
Hemoglobin: 9.2 g/dL — ABNORMAL LOW (ref 12.0–15.0)
MCH: 27.3 pg (ref 26.0–34.0)
MCHC: 32.7 g/dL (ref 30.0–36.0)
MCV: 83.4 fL (ref 78.0–100.0)
PLATELETS: 67 10*3/uL — AB (ref 150–400)
RBC: 3.37 MIL/uL — ABNORMAL LOW (ref 3.87–5.11)
RDW: 17.5 % — AB (ref 11.5–15.5)
WBC: 7.7 10*3/uL (ref 4.0–10.5)

## 2016-08-17 LAB — GLUCOSE, CAPILLARY
GLUCOSE-CAPILLARY: 179 mg/dL — AB (ref 65–99)
Glucose-Capillary: 250 mg/dL — ABNORMAL HIGH (ref 65–99)
Glucose-Capillary: 238 mg/dL — ABNORMAL HIGH (ref 65–99)

## 2016-08-17 MED ORDER — METOLAZONE 5 MG PO TABS
5.0000 mg | ORAL_TABLET | Freq: Once | ORAL | Status: AC
  Administered 2016-08-17: 5 mg via ORAL
  Filled 2016-08-17: qty 1

## 2016-08-17 MED ORDER — POTASSIUM CHLORIDE CRYS ER 20 MEQ PO TBCR
60.0000 meq | EXTENDED_RELEASE_TABLET | Freq: Once | ORAL | Status: AC
  Administered 2016-08-17: 60 meq via ORAL
  Filled 2016-08-17: qty 3

## 2016-08-17 NOTE — Progress Notes (Signed)
PROGRESS NOTE  Monica BLIXT  NWG:[id number] DOB: 06-25-1950 DOA: 08/12/2016 PCP: Galvin Proffer, MD Outpatient Specialists:  Subjective: Seen with her husband bedside, continues to have significant lower extremity edema. Lasix increased to 3 times a day and metolazone added. PT/OT to evaluate and treat.  Brief Narrative:  Monica [name] is a 66 y.o. female with PMH as outlined below. She was brought to [facility] ED 3/27 after being found in her bed unresponsive by husband around 11AM that morning. In ED, she required intubation for airway protection.  Ammonia elevated at 171, AoCKD (SCr 2.12), Mag 0.8, Osmolality 305, UDS positive for benzo's, CT head negative. Recent admission 3/9-3/12 for same issue. Per husband and daughter, has been compliant with lactulose   has a past medical history of Bilateral lower extremity edema; Carotid artery bruit; Chest pain (09/02/2012); Coronary atherosclerosis of unspecified type of vessel, native or graft; Endometrial polyp; Esophageal varices (HCC); GERD (gastroesophageal reflux disease); Hiatal hernia; Hyperlipidemia; Hypertension; Iron deficiency anemia; Irritable bowel syndrome; Macular degeneration (senile) of retina, unspecified; Obesity, unspecified; Osteoarthrosis, unspecified whether generalized or localized, unspecified site; PVD (peripheral vascular disease) (HCC); SOB (shortness of breath); Tobacco abuse; Type II or unspecified type diabetes mellitus without mention of complication, not stated as uncontrolled; and Unspecified sleep apnea.  Assessment & Plan:   Principal Problem:   Acute respiratory failure with hypoxia (HCC) Active Problems:   Bilateral lower extremity edema   Ascites   Hepatic cirrhosis (HCC)   Hepatic encephalopathy (HCC)   Acute encephalopathy   Pneumonia due to hemophilus influenzae (HCC)   Acute respiratory failure with hypoxia -Required intubation and mechanical ventilation, patient admitted to the hospital with  somnolence unable to protect her airway. -Intubated in the ED, mechanically ventilated admitted to the ICU, extubated on 08/14/2016. -Respiratory failure with hypoxia secondary to pneumonia and she was severely encephalopathic.  Acute hepatic encephalopathy -Admitted with somnolence, has history of hepatic cirrhosis and hepatic encephalopathy, ammonia level was 171. -Treated with lactulose and Xifaxan. -She is back to her baseline she is conversant and awake, alert and oriented 3.  Haemophilus influenzae Pneumonia -CXR on admission was clear, she developed interstitial infiltrates after intubation. -Developed leukocytosis and low-grade fever while she was in the hospital. -Tracheal aspirate culture showed Haemophilus influenzae, currently on Rocephin, continue. -1/2 blood cultures showed coagulase-negative staph likely contaminant.  Fluid overload -Patient has +2 pedal edema up to her thighs, she was on Lasix at home this was held on admission and given IV fluids. -Continue aggressive diuresis with Lasix, metolazone added.  Hypomagnesemia -Magnesium was 1.6, repleted with oral supplements.  CKD stage III -She is around her baseline of creatinine 2.2, creatinine was 1.99 on discharge from the hospital on 3/12.  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  Diabetes mellitus type 2 -Uncontrolled diabetes mellitus with hyperglycemia, hemoglobin A1c is 5.8 which correlate with mean plasma glucose of 197. -Carbohydrate modified diet and SSI.   DVT prophylaxis:  Code Status: Full Code Family Communication:  Disposition Plan:  Diet: DIET SOFT Room service appropriate? Yes; Fluid consistency: Thin  Consultants:   Was under PCCM until 3/30  Procedures:   Intubation/extubation  Antimicrobials:   ceftriaxone   Objective: Vitals:   08/16/16 1739 08/16/16 2059 08/17/16 0129  08/17/16 0532  BP: (!) 114/47 (!) 123/54  (!) 121/48  Pulse: (!) 58 60  61  Resp: Temp: 98.1 F (36.7 C) 98.1  F (36.7 C)  98.4 F (36.9 C)  TempSrc: Oral     SpO2: 98% 96%  99%  Weight:   104.9 kg (231 lb 5 oz)   Height:        Intake/Output Summary (Last 24 hours) at 08/17/16 1303 Last data filed at 08/17/16 1204  Gross per 24 hour  Intake                0 ml  Output             1900 ml  Net            -1900 ml   Filed Weights   08/15/16 2239 08/16/16 0242 08/17/16 0129  Weight: 105 kg (231 lb 8 oz) 105.3 kg (232 lb 2 oz) 104.9 kg (231 lb 5 oz)    Examination: General exam: Appears calm and comfortable  Respiratory system: Clear to auscultation. Respiratory effort normal. Cardiovascular system: S1 & S2 heard, RRR. No JVD, murmurs, rubs, gallops or clicks. No pedal edema. Gastrointestinal system: Abdomen is nondistended, soft and nontender. No organomegaly or masses felt. Normal bowel sounds heard. Central nervous system: Alert and oriented. No focal neurological deficits. Extremities: Symmetric 5 x 5 power. Skin: No rashes, lesions or ulcers Psychiatry: Judgement and insight appear normal. Mood & affect appropriate.   Data Reviewed: I have personally reviewed following labs and imaging studies  CBC:  Recent Labs Lab 08/13/16 0244 08/14/16 0347 08/14/16 0725 08/15/16 0256 08/17/16 0347  WBC 20.0* 16.3* 14.2* 9.0 7.7  HGB 11.1* 10.1* 9.9* 9.8* 9.2*  HCT 33.9* 30.7* 30.4* 29.9* 28.1*  MCV 83.5 83.7 83.7 83.8 83.4  PLT 79* 69* 76* 70* 67*   Basic Metabolic Panel:  Recent Labs Lab 08/12/16 1616 08/13/16 0244 08/13/16 1637 08/14/16 1123 08/15/16 0256 08/17/16 0347  NA  --  138 139 139 141 135  K  --  4.2 4.3 3.9 3.6 3.8  CL  --  109 112* 111 112* 104  CO2  --  18* 16* 17* 19* 21*  GLUCOSE  --  241* 238* 299* 185* 225*  BUN  --  31* 35* 42* 46* 49*  CREATININE  --  2.00* 2.22* 2.28* 2.28* 2.18*  CALCIUM  --  7.7* 7.8* 8.1* 8.5* 8.3*  MG  1.2* 1.5*  --  1.7 1.6* 2.2  PHOS  --  3.0  --  2.8 2.9  --    GFR: Estimated Creatinine Clearance: 29.4 mL/min (A) (by C-G formula based on SCr of 2.18 mg/dL (H)). Liver Function Tests:  Recent Labs Lab 08/12/16 1154 08/14/16 1123  AST 25 23  ALT 19 17  ALKPHOS 111 89  BILITOT 0.9 1.0  PROT 5.2* 5.1*  ALBUMIN 2.4* 2.3*   No results for input(s): LIPASE, AMYLASE in the last 168 hours.  Recent Labs Lab 08/12/16 1155  AMMONIA 171*   Coagulation Profile:  Recent Labs Lab 08/12/16 1154  INR 1.50   Cardiac Enzymes:  Recent Labs Lab 08/12/16 1154  TROPONINI <0.03   BNP (last 3 results) No results for input(s): PROBNP in the last 8760 hours. HbA1C: No results for input(s): HGBA1C in the last 72 hours. CBG:  Recent Labs Lab 08/16/16 1146 08/16/16 1737 08/16/16 2057 08/17/16 0756 08/17/16 1149  GLUCAP 264* 239* 282* 179* 250*   Lipid Profile: No results for input(s): CHOL, HDL, LDLCALC, TRIG, CHOLHDL, LDLDIRECT in the last 72 hours. Thyroid Function Tests: No results for input(s): TSH, T4TOTAL, FREET4, T3FREE, THYROIDAB in the  last 72 hours. Anemia Panel: No results for input(s): VITAMINB12, FOLATE, FERRITIN, TIBC, IRON, RETICCTPCT in the last 72 hours. Urine analysis:    Component Value Date/Time   COLORURINE YELLOW 08/12/2016 1305   APPEARANCEUR CLEAR 08/12/2016 1305   LABSPEC 1.012 08/12/2016 1305   PHURINE 6.0 08/12/2016 1305   GLUCOSEU NEGATIVE 08/12/2016 1305   GLUCOSEU NEGATIVE 08/31/2013 1516   HGBUR NEGATIVE 08/12/2016 1305   BILIRUBINUR NEGATIVE 08/12/2016 1305   KETONESUR NEGATIVE 08/12/2016 1305   PROTEINUR NEGATIVE 08/12/2016 1305   UROBILINOGEN 0.2 08/31/2013 1516   NITRITE NEGATIVE 08/12/2016 1305   LEUKOCYTESUR NEGATIVE 08/12/2016 1305   Sepsis Labs: (procalcitonin:4,lacticidven:4)  ) Recent Results (from the past 240 hour(s))  MRSA PCR Screening     Status: Abnormal   Collection Time: 08/12/16  6:47 PM  Result Value  Ref Range Status   MRSA by PCR POSITIVE (A) NEGATIVE Final    Comment:        The GeneXpert MRSA Assay (FDA approved for NASAL specimens only), is one component of a comprehensive MRSA colonization surveillance program. It is not intended to diagnose MRSA infection nor to guide or monitor treatment for MRSA infections. RESULT CALLED TO, READ BACK BY AND VERIFIED WITH: M GRIFFIN,RN  08/13/16 MKELLY,MLT   Culture, respiratory (NON-Expectorated)     Status: None   Collection Time: 08/12/16  6:50 PM  Result Value Ref Range Status   Specimen Description TRACHEAL ASPIRATE  Final   Special Requests NONE  Final   Gram Stain   Final    ABUNDANT WBC PRESENT, PREDOMINANTLY PMN ABUNDANT GRAM NEGATIVE RODS FEW GRAM POSITIVE COCCI IN PAIRS RARE GRAM POSITIVE RODS RARE GRAM NEGATIVE COCCI IN PAIRS    Culture   Final    ABUNDANT HAEMOPHILUS INFLUENZAE BETA LACTAMASE NEGATIVE    Report Status 08/14/2016 FINAL  Final  Culture, blood (Routine X 2) w Reflex to ID Panel     Status: None (Preliminary result)   Collection Time: 08/13/16 10:25 AM  Result Value Ref Range Status   Specimen Description BLOOD LEFT HAND  Final   Special Requests BOTTLES DRAWN AEROBIC ONLY 6CC  Final   Culture NO GROWTH 4 DAYS  Final   Report Status PENDING  Incomplete  Culture, blood (Routine X 2) w Reflex to ID Panel     Status: Abnormal   Collection Time: 08/13/16 10:32 AM  Result Value Ref Range Status   Specimen Description BLOOD RIGHT HAND  Final   Special Requests IN PEDIATRIC BOTTLE 3CC  Final   Culture  Setup Time   Final    GRAM POSITIVE COCCI IN CLUSTERS IN PEDIATRIC BOTTLE CRITICAL RESULT CALLED TO, READ BACK BY AND VERIFIED WITH: Veronda Prude.D. 15:00 08/14/16 ([email or web address])    Culture (A)  Final    STAPHYLOCOCCUS SPECIES (COAGULASE NEGATIVE) THE SIGNIFICANCE OF ISOLATING THIS ORGANISM FROM A SINGLE SET OF BLOOD CULTURES WHEN MULTIPLE SETS ARE DRAWN IS UNCERTAIN. PLEASE NOTIFY THE MICROBIOLOGY  DEPARTMENT WITHIN ONE WEEK IF SPECIATION AND SENSITIVITIES ARE REQUIRED.    Report Status 08/15/2016 FINAL  Final  Blood Culture ID Panel (Reflexed)     Status: Abnormal   Collection Time: 08/13/16 10:32 AM  Result Value Ref Range Status   Enterococcus species NOT DETECTED NOT DETECTED Final   Listeria monocytogenes NOT DETECTED NOT DETECTED Final   Staphylococcus species DETECTED (A) NOT DETECTED Final    Comment: Methicillin (oxacillin) susceptible coagulase negative staphylococcus. Possible blood culture contaminant (unless isolated from more than one blood  culture draw or clinical case suggests pathogenicity). No antibiotic treatment is indicated for blood  culture contaminants. CRITICAL RESULT CALLED TO, READ BACK BY AND VERIFIED WITH: Veronda Prude.D. 15:00 08/14/16 ([email or web address])    Staphylococcus aureus NOT DETECTED NOT DETECTED Final   Methicillin resistance NOT DETECTED NOT DETECTED Final   Streptococcus species NOT DETECTED NOT DETECTED Final   Streptococcus agalactiae NOT DETECTED NOT DETECTED Final   Streptococcus pneumoniae NOT DETECTED NOT DETECTED Final   Streptococcus pyogenes NOT DETECTED NOT DETECTED Final   Acinetobacter baumannii NOT DETECTED NOT DETECTED Final   Enterobacteriaceae species NOT DETECTED NOT DETECTED Final   Enterobacter cloacae complex NOT DETECTED NOT DETECTED Final   Escherichia coli NOT DETECTED NOT DETECTED Final   Klebsiella oxytoca NOT DETECTED NOT DETECTED Final   Klebsiella pneumoniae NOT DETECTED NOT DETECTED Final   Proteus species NOT DETECTED NOT DETECTED Final   Serratia marcescens NOT DETECTED NOT DETECTED Final   Haemophilus influenzae NOT DETECTED NOT DETECTED Final   Neisseria meningitidis NOT DETECTED NOT DETECTED Final   Pseudomonas aeruginosa NOT DETECTED NOT DETECTED Final   Candida albicans NOT DETECTED NOT DETECTED Final   Candida glabrata NOT DETECTED NOT DETECTED Final   Candida krusei NOT DETECTED NOT DETECTED Final    Candida parapsilosis NOT DETECTED NOT DETECTED Final   Candida tropicalis NOT DETECTED NOT DETECTED Final     Invalid input(s): PROCALCITONIN, LACTICACIDVEN   Radiology Studies: Dg Swallowing Func-speech Pathology  Result Date: 08/16/2016 Objective Swallowing Evaluation: Type of Study: MBS-Modified Barium Swallow Study Patient Details Name: VANSHIKA JASTRZEBSKI MRN: [id number] Date of Birth: 10-Jul-1950 Today's Date: 08/16/2016 Time: SLP Start Time (ACUTE ONLY): 1030-SLP Stop Time (ACUTE ONLY): 1100 SLP Time Calculation (min) (ACUTE ONLY): 30 min Past Medical History: Past Medical History: Diagnosis Date . Bilateral lower extremity edema  . Carotid artery bruit   Doppler 07/09/2011 - abnormal study, no change from previous . Chest pain 09/02/2012  Normal, EF-67 . Coronary atherosclerosis of unspecified type of vessel, native or graft  . Endometrial polyp  . Esophageal varices (HCC)  . GERD (gastroesophageal reflux disease)  . Hiatal hernia  . Hyperlipidemia  . Hypertension  . Iron deficiency anemia  . Irritable bowel syndrome  . Macular degeneration (senile) of retina, unspecified  . Obesity, unspecified  . Osteoarthrosis, unspecified whether generalized or localized, unspecified site  . 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 SOB (shortness of breath)   Normal 2D echo - EF-58, 03/18/2001 . Tobacco abuse  . Type II or unspecified type diabetes mellitus without mention of complication, not stated as uncontrolled  . Unspecified sleep apnea  Past Surgical History: Past Surgical History: Procedure Laterality Date . APPENDECTOMY   . CARDIAC CATHETERIZATION  03/18/2007  Normal coronary arteries and LV function . CARDIAC CATHETERIZATION  07/01/2001  Normal cath . CARDIAC CATHETERIZATION  05/17/1997  Normal cath . CAROTID ENDARTERECTOMY   . CHOLECYSTECTOMY   HPI: Patient is a 66 yo female admitted 08/12/16 after being found unresponsive by husband. Patient  intubated 08/12/16. Patient with acute hepatic encephalopathy, and pna. Patient extubated 08/14/16. PMH: GERD, hiatal hernia, ?esophageal varices, IBS, cirrhosis with NASH, Admit 07/25/16-07/28/16 with acute hepatic encephalopathy, BLE edema, chest pain, HLD, HTN, anemia, blind Lt eye, decreased vision Rt eye, OA, obesity, PVD, SOB, tobacco use, DM, OSA, CAD, falls at home. Bedside swallowing evaluation 07/26/16 with findings of oropharyngeal swallowing function which appears grossly within functional limits at bedside  for baseline diet (soft foods with thin liquids). Mild aspiration risk given dentition, history of GERD, esophageal history. CXR 08/14/16 Persistent patchy interstitial infiltrates in the left mid and lower lung and at the right lung base.  Subjective: "We learned a lot from the last time we saw you." Assessment / Plan / Recommendation CHL IP CLINICAL IMPRESSIONS 08/16/2016 Clinical Impression Patient presents with mild-moderate oropharyngeal dysphagia and suspected primary esophageal dysphagia. Deviations in swallow physiology include reduced pharyngeal peristalsis, with moderate pyriform residue with thin liquids, suspect from suboptimal pressures for complete bolus clearance through the UES. Oral stage of swallowing characterized by lingual pumping, reduced bolus cohesion and piecemeal deglutition with solids. Swallow initation delayed to the pyriform sinuses with thin liquids, valleculae with other consistencies. With thin liquids, penetration during the swallow x2 due to delayed initiation and of residue after the swallow x1. Pt does not sense penetration, but a cued throat clear reduces penetrated material. Small cup sips were better retained in the valleculae and airway protection was consistently adequate with this strategy. Heavier boluses improved clearance through the UES; minimal solid residues noted. With mixed consistency (barium tablet and thin liquid), pt with prolonged bolus holding in  the oral cavity, premature spillage to pyriform sinuses resulting in penetration during the swallow. An esophageal sweep was performed in the upright position. The barium tablet paused intermittently in the proximal and mid-thoracic esophagus, and was remarkable for dysmotility per radiologist. Distal esophagus could not be visualized due to patient's size which limited movement of the equipment. Following the assessment, patient began coughing when reclined to transfer to bed, stated "things get stuck" and "I cough this phlegm up". She endorsed foreign body sensation at sternum. Given the above, recommend pt continue her baseline soft diet (dys 3) with thin liquids via single cup sips only (no straws), medications whole in puree. She may benefit from alternating solids and liquids to improve bolus clearance through the esophagus. She remains at risk for aspiration given esophageal component; may benefit from esophageal assessment to assess pressures, GI referral. SLP Visit Diagnosis Dysphagia, oropharyngeal phase (R13.12);Dysphagia, pharyngoesophageal phase (R13.14) Attention and concentration deficit following -- Frontal lobe and executive function deficit following -- Impact on safety and function Moderate aspiration risk   CHL IP TREATMENT RECOMMENDATION 08/16/2016 Treatment Recommendations Therapy as outlined in treatment plan below   Prognosis 08/16/2016 Prognosis for Safe Diet Advancement Good Barriers to Reach Goals -- Barriers/Prognosis Comment -- CHL IP DIET RECOMMENDATION 08/16/2016 SLP Diet Recommendations Dysphagia 3 (Mech soft) solids;Thin liquid Liquid Administration via No straw;Cup Medication Administration Whole meds with puree Compensations Minimize environmental distractions;Slow rate;Small sips/bites Postural Changes Remain semi-upright after after feeds/meals (Comment);Seated upright at 90 degrees   CHL IP OTHER RECOMMENDATIONS 08/16/2016 Recommended Consults Consider esophageal assessment;Consider  GI evaluation Oral Care Recommendations Oral care BID Other Recommendations --   CHL IP FOLLOW UP RECOMMENDATIONS 08/16/2016 Follow up Recommendations Other (comment)   CHL IP FREQUENCY AND DURATION 08/16/2016 Speech Therapy Frequency (ACUTE ONLY) min 1 x/week Treatment Duration 1 week      CHL IP ORAL PHASE 08/16/2016 Oral Phase Impaired Oral - Pudding Teaspoon -- Oral - Pudding Cup -- Oral - Honey Teaspoon -- Oral - Honey Cup -- Oral - Nectar Teaspoon -- Oral - Nectar Cup Lingual pumping Oral - Nectar Straw -- Oral - Thin Teaspoon -- Oral - Thin Cup Lingual pumping Oral - Thin Straw Lingual pumping Oral - Puree Lingual pumping;Reduced posterior propulsion;Piecemeal swallowing;Decreased bolus cohesion Oral - Mech Soft Lingual pumping;Reduced posterior  propulsion;Piecemeal swallowing;Decreased bolus cohesion Oral - Regular Lingual pumping;Reduced posterior propulsion;Piecemeal swallowing;Decreased bolus cohesion Oral - Multi-Consistency Lingual pumping;Holding of bolus Oral - Pill -- Oral Phase - Comment --  CHL IP PHARYNGEAL PHASE 08/16/2016 Pharyngeal Phase Impaired Pharyngeal- Pudding Teaspoon -- Pharyngeal -- Pharyngeal- Pudding Cup -- Pharyngeal -- Pharyngeal- Honey Teaspoon -- Pharyngeal -- Pharyngeal- Honey Cup -- Pharyngeal -- Pharyngeal- Nectar Teaspoon -- Pharyngeal -- Pharyngeal- Nectar Cup Delayed swallow initiation-pyriform sinuses;Reduced pharyngeal peristalsis Pharyngeal -- Pharyngeal- Nectar Straw -- Pharyngeal -- Pharyngeal- Thin Teaspoon -- Pharyngeal -- Pharyngeal- Thin Cup Delayed swallow initiation-pyriform sinuses;Reduced pharyngeal peristalsis;Penetration/Aspiration during swallow;Penetration/Apiration after swallow;Pharyngeal residue - pyriform Pharyngeal Material enters airway, remains ABOVE vocal cords and not ejected out Pharyngeal- Thin Straw Delayed swallow initiation-pyriform sinuses;Reduced pharyngeal peristalsis;Penetration/Aspiration during swallow;Pharyngeal residue - pyriform  Pharyngeal Material enters airway, remains ABOVE vocal cords and not ejected out Pharyngeal- Puree Delayed swallow initiation-vallecula;Reduced pharyngeal peristalsis Pharyngeal -- Pharyngeal- Mechanical Soft Delayed swallow initiation-vallecula;Reduced pharyngeal peristalsis Pharyngeal -- Pharyngeal- Regular Delayed swallow initiation-vallecula;Reduced pharyngeal peristalsis Pharyngeal -- Pharyngeal- Multi-consistency Delayed swallow initiation-pyriform sinuses;Reduced pharyngeal peristalsis;Penetration/Aspiration during swallow;Pharyngeal residue - pyriform Pharyngeal Material enters airway, remains ABOVE vocal cords and not ejected out Pharyngeal- Pill -- Pharyngeal -- Pharyngeal Comment --  CHL IP CERVICAL ESOPHAGEAL PHASE 08/16/2016 Cervical Esophageal Phase Impaired Pudding Teaspoon -- Pudding Cup -- Honey Teaspoon -- Honey Cup -- Nectar Teaspoon -- Nectar Cup -- Nectar Straw -- Thin Teaspoon -- Thin Cup Reduced cricopharyngeal relaxation Thin Straw Reduced cricopharyngeal relaxation Puree -- Mechanical Soft -- Regular -- Multi-consistency -- Pill -- Cervical Esophageal Comment -- No flowsheet data found. Arlana Lindau 08/16/2016, 12:13 PM Rondel Baton, MS CF-SLP Speech-Language Pathologist [phone]                  Scheduled Meds: . cefTRIAXone (ROCEPHIN)  IV  1 g Intravenous Q24H  . clopidogrel  75 mg Per Tube Daily  . furosemide  40 mg Intravenous Q8H  . insulin aspart  0-15 Units Subcutaneous TID WC & HS  . insulin glargine  15 Units Subcutaneous BID  . lactulose  15 g Per Tube BID  . levothyroxine  75 mcg Oral QAC breakfast  . mupirocin ointment   Nasal BID  . pantoprazole  40 mg Oral Q1200  . propranolol  60 mg Per Tube Daily  . rifaximin  550 mg Per Tube BID   Continuous Infusions:   LOS: 5 days    Time spent: 35 minutes    Fue Cervenka A, MD [facility] Pager [phone]  If 7PM-7AM, please contact night-coverage [email or web address] Password [email or web address] 08/17/2016, 1:03  PM

## 2016-08-18 DIAGNOSIS — I8500 Esophageal varices without bleeding: Secondary | ICD-10-CM

## 2016-08-18 LAB — BASIC METABOLIC PANEL
Anion gap: 8 (ref 5–15)
BUN: 49 mg/dL — AB (ref 6–20)
CHLORIDE: 105 mmol/L (ref 101–111)
CO2: 21 mmol/L — ABNORMAL LOW (ref 22–32)
CREATININE: 2.14 mg/dL — AB (ref 0.44–1.00)
Calcium: 8.5 mg/dL — ABNORMAL LOW (ref 8.9–10.3)
GFR, EST AFRICAN AMERICAN: 27 mL/min — AB (ref >60)
GFR, EST NON AFRICAN AMERICAN: 23 mL/min — AB (ref >60)
Glucose, Bld: 200 mg/dL — ABNORMAL HIGH (ref 65–99)
Potassium: 3.9 mmol/L (ref 3.5–5.1)
SODIUM: 134 mmol/L — AB (ref 135–145)

## 2016-08-18 LAB — CBC
HCT: 27.9 % — ABNORMAL LOW (ref 36.0–46.0)
Hemoglobin: 9.1 g/dL — ABNORMAL LOW (ref 12.0–15.0)
MCH: 27.1 pg (ref 26.0–34.0)
MCHC: 32.6 g/dL (ref 30.0–36.0)
MCV: 83 fL (ref 78.0–100.0)
PLATELETS: 73 10*3/uL — AB (ref 150–400)
RBC: 3.36 MIL/uL — AB (ref 3.87–5.11)
RDW: 17.6 % — AB (ref 11.5–15.5)
WBC: 8.3 10*3/uL (ref 4.0–10.5)

## 2016-08-18 LAB — GLUCOSE, CAPILLARY
GLUCOSE-CAPILLARY: 177 mg/dL — AB (ref 65–99)
GLUCOSE-CAPILLARY: 180 mg/dL — AB (ref 65–99)
Glucose-Capillary: 255 mg/dL — ABNORMAL HIGH (ref 65–99)

## 2016-08-18 LAB — CULTURE, BLOOD (ROUTINE X 2): Culture: NO GROWTH

## 2016-08-18 MED ORDER — LACTULOSE 10 GM/15ML PO SOLN: 20.0000 g | Freq: Three times a day (TID) | ORAL | 2 refills | Status: AC

## 2016-08-18 MED ORDER — MAGNESIUM OXIDE 400 MG PO TABS: 400.0000 mg | ORAL_TABLET | Freq: Every day | ORAL | 2 refills | Status: AC

## 2016-08-18 MED ORDER — MULTI-VITAMIN/MINERALS PO TABS: 1.0000 | ORAL_TABLET | Freq: Every day | ORAL | 0 refills | Status: AC

## 2016-08-18 MED ORDER — SPIRONOLACTONE 100 MG PO TABS: 100.0000 mg | ORAL_TABLET | Freq: Every day | ORAL | 3 refills | Status: DC

## 2016-08-18 MED ORDER — FUROSEMIDE 40 MG PO TABS: 40.0000 mg | ORAL_TABLET | Freq: Two times a day (BID) | ORAL | 2 refills | Status: DC

## 2016-08-18 MED ORDER — RIFAXIMIN 550 MG PO TABS: 550.0000 mg | ORAL_TABLET | Freq: Two times a day (BID) | ORAL | 0 refills | Status: AC

## 2016-08-18 MED FILL — XIFAXAN 550 MG TABLET: 550 | 30 days supply | Qty: 60 | Fill #0

## 2016-08-18 NOTE — Progress Notes (Signed)
Benefits check in process for Xifaximin , copay and prior authorization. Gae Gallop RN,BSN,CM [phone]

## 2016-08-18 NOTE — Care Management Note (Signed)
Case Management Note  Patient Details  Name: Monica [name] MRN: [id number] Date of Birth: 04-Dec-1950  Subjective/Objective:   Admitted with Acute respiratory failure.             Action/Plan: Plan is to d/c home today with transportation to home via [facility]. Daughter is to pick up pt's Rifaximin/ medication from [facility] prior to d/c.  Expected Discharge Date:  08/18/16               Expected Discharge Plan:  Home w Home Health Services  In-House Referral:     Discharge planning Services  CM Consult  Post Acute Care Choice:    Choice offered to:     DME Arranged:    DME Agency:     HH Arranged:   RN,PT,OT,SW,NA HH Agency:   [facility]/ referral made to Marcelino Duster [phone]  Status of Service:  complete  If discussed at Long Length of Stay Meetings, dates discussed:    Additional Comments:  Epifanio Lesches, RN 08/18/2016, 3:06 PM

## 2016-08-18 NOTE — Progress Notes (Signed)
qPhysical Therapy Treatment Patient Details Name: Monica Y CaJIAH [id number] DOB: 11/13/1950 Today's Date: 08/18/2016    History of Present Illness Patient is a 66 yo female admitted 08/12/16 after being found unresponsive by husband.  Patient intubated 08/12/16.  Patient with acute hepatic encephalopathy, and pna.  Patient extubated 08/14/16.    PMH:  Admit 07/25/16-07/28/16 with acute hepatic encephalopathy, BLE edema, chest pain, HLD, HTN, anemia, blind Lt eye, decreased vision Rt eye, OA, obesity, PVD, SOB, tobacco use, DM, OSA, CAD, falls at home    PT Comments    Pt performed increased activity and able to stand and progress steps from bed to chair.  Pt fatigued quickly with mobility and will continue to benefit from SNF placement short term to improve strength and promote functional independence.  Pt and family are refusing SNF and will likley need to max out [facility] services to improve safety at home.  Pt will require ambulance transport for safe entry into home.    Follow Up Recommendations  SNF;Supervision/Assistance - 24 hour (Family refusing will likely need to max out HHPT, OT and aide due to refusing SNF placement.  )     Equipment Recommendations  Wheelchair (measurements PT);Wheelchair cushion (measurements PT)    Recommendations for Other Services       Precautions / Restrictions Precautions Precautions: Fall Restrictions Weight Bearing Restrictions: No    Mobility  Bed Mobility Overal bed mobility: Needs Assistance Bed Mobility: Rolling;Supine to Sit Rolling: Min guard   Supine to sit: Mod assist     General bed mobility comments: Pt required cues for rolling to R and L for perianal care.  Pt performed multiple bouts of rolling to R and to L.  Pt required assist to advance LEs to edge of bed and assist trunk in sitting edge of bed.  pt able to maintain sitting balance edge of bed in prep for transfer.      Transfers Overall transfer level: Needs  assistance Equipment used: None (PT held to PTA shoulder with one hand and pushed from the bed with the other.  ) Transfers: Sit to/from [employer] Sit to Stand: Mod assist Stand pivot transfers: Min assist       General transfer comment: Cues for upright posture and advancing steps toward the chair.   cues for hand placement and controling descent to seated surface.    Ambulation/Gait Ambulation/Gait assistance:  (steps to advance to chair.  ) Ambulation Distance (Feet):  (steps to chair.  ) Assistive device: None Gait Pattern/deviations: Step-to pattern;Antalgic;Trunk flexed;Shuffle         Stairs            Wheelchair Mobility    Modified Rankin (Stroke Patients Only)       Balance Overall balance assessment: Needs assistance;History of Falls Sitting-balance support: No upper extremity supported;Feet supported Sitting balance-Leahy Scale: Good       Standing balance-Leahy Scale: Poor Standing balance comment: required blocking of knees intially to come into standing.                              Cognition Arousal/Alertness: Awake/alert Behavior During Therapy: Flat affect Overall Cognitive Status: Impaired/Different from baseline Area of Impairment: Attention;Memory;Following commands;Problem solving                   Current Attention Level: Sustained Memory: Decreased short-term memory Following Commands: Follows one step commands with increased time  Problem Solving: Slow processing;Decreased initiation;Difficulty sequencing;Requires verbal cues        Exercises      General Comments General comments (skin integrity, edema, etc.): Pt found with incontinence of stool on arrival.  Pt required perianal care and application of barrier cream to maintain skin integrity.  Skin red with compromised areas.  RN informed.        Pertinent Vitals/Pain Pain Assessment: Faces Faces Pain Scale: Hurts little more Pain  Location: buttocks during perianal care.   Pain Descriptors / Indicators: Grimacing;Guarding;Burning Pain Intervention(s): Limited activity within patient's tolerance;Premedicated before session;Repositioned    Home Living                      Prior Function            PT Goals (current goals can now be found in the care plan section) Acute Rehab PT Goals Patient Stated Goal: to go home Potential to Achieve Goals: Fair Progress towards PT goals: Progressing toward goals    Frequency    Min 3X/week      PT Plan Current plan remains appropriate    Co-evaluation             End of Session Equipment Utilized During Treatment: Gait belt Activity Tolerance: Patient tolerated treatment well Patient left: in bed;with call bell/phone within reach;in chair Nurse Communication: Mobility status (need for assistance back to bed. +2 for safety with nursing.  ) PT Visit Diagnosis: Repeated falls (R29.6);Muscle weakness (generalized) (M62.81);Difficulty in walking, not elsewhere classified (R26.2)     Time: 0981-1914 PT Time Calculation (min) (ACUTE ONLY): 46 min  Charges:  $Therapeutic Activity: 38-52 mins                    G Codes:       Joycelyn Rua, PTA pager [phone]    Florestine Avers 08/18/2016, 1:03 PM

## 2016-08-18 NOTE — Discharge Summary (Signed)
Physician Discharge Summary  Monica [name] XEN:[id number] DOB: 30-Apr-1951 DOA: 08/12/2016  PCP: Bonnita Nasuti, MD  Admit date: 08/12/2016 Discharge date: 08/18/2016  Admitted From: Home Disposition: Home  Recommendations for Outpatient Follow-up:  1. Follow up with PCP in 1-2 weeks 2. Please obtain BMP/CBC in one week.  Home Health: NA Equipment/Devices:NA  Discharge Condition: Stable CODE STATUS: Full Code Diet recommendation: DIET SOFT Room service appropriate? Yes; Fluid consistency: Thin Diet - low sodium heart healthy  Brief/Interim Summary: Monica Zuk Causeyis a 66 y.o.femalewith PMH as outlined below. She was brought to [facility] ED 3/27 after being found in her bed unresponsive by husband around 11AM that morning. In ED, she required intubation for airway protection.  Ammonia elevated at 171, AoCKD (SCr 2.12), Mag 0.8, Osmolality 305, UDS positive for benzo's, CT head negative. Recent admission 3/9-3/12 for same issue. Per husband and daughter, has been compliant with lactulose  has a past medical history of Bilateral lower extremity edema; Carotid artery bruit; Chest pain (09/02/2012); Coronary atherosclerosis of unspecified type of vessel, native or graft; Endometrial polyp; Esophageal varices (HCC); GERD (gastroesophageal reflux disease); Hiatal hernia; Hyperlipidemia; Hypertension; Iron deficiency anemia; Irritable bowel syndrome; Macular degeneration (senile) of retina, unspecified; Obesity, unspecified; Osteoarthrosis, unspecified whether generalized or localized, unspecified site; PVD (peripheral vascular disease) ([place]); SOB (shortness of breath); Tobacco abuse; Type II or unspecified type diabetes mellitus without mention of complication, not stated as uncontrolled; and Unspecified sleep apnea.  Discharge Diagnoses:  Principal Problem:   Acute respiratory failure with hypoxia (HCC) Active Problems:   Bilateral lower extremity edema   Ascites   Hepatic cirrhosis (HCC)    Hepatic encephalopathy (HCC)   Acute encephalopathy   Pneumonia due to hemophilus influenzae ([place])   Acute respiratory failure with hypoxia -Admitted to the hospital with somnolence unable to protect her airway. -Intubated in the ED, mechanically ventilated admitted to the ICU, extubated on 08/14/2016. -Respiratory failure with hypoxia secondary to severe hepatic encephalopathy and pneumonia.  Acute hepatic encephalopathy -Admitted with somnolence, has history of hepatic cirrhosis and hepatic encephalopathy, ammonia level was 171. -Treated with lactulose and Xifaxan. -She is back to her baseline she is conversant and awake, alert and oriented 3. Lactulose dose adjusted.  Haemophilus influenzae Pneumonia -CXR on admission was clear, she developed interstitial infiltrates after intubation. -Developed leukocytosis and low-grade fever while she was in the hospital. -Tracheal aspirate culture showed Haemophilus influenzae. -1/2 blood cultures showed coagulase-negative staph likely contaminant. -Treated with Rocephin for a total of 6 days, no antibiotics on discharge.  Fluid overload -Patient has +2 pedal edema up to her thighs, she was on Lasix, compliance is a question. -Aggressively diuresed with IV Lasix and metolazone while she was in the hospital weight down to 230 from 239. -On discharge Lasix increased to 40 mg twice a day, recommend to continue Aldactone at 100 mg daily.  Hypomagnesemia -Magnesium was 1.6, repleted with oral supplements. Magnesium supplements prescribed on discharge.  CKD stage III -She is around her baseline of creatinine 2.2, creatinine was 1.99 on discharge from the hospital on 3/12.  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 -No known history of drinking or hep C  Diabetes mellitus type 2 -Uncontrolled diabetes mellitus with hyperglycemia,  hemoglobin A1c is 8.5 which correlate with mean plasma glucose of 197. -Carbohydrate modified diet and SSI.  Social issues -Patient and her husband (I met him more than once) seems to have poor understanding of  her disease. -I explained to him about lactulose and how many bowel movements one knees per day. -Also had very prolonged discussion about salt and fluid restriction. -Patient and her husband concerned about financial burden because of her multiple medications. -Have asked case manager to see if there is any help can be provided to them. -Please note initially PT recommended SNF placement the patient and her husband declined and wants to go home. -Seems she is high risk for readmission with hepatic encephalopathy.  Discharge Instructions  Discharge Instructions    Diet - low sodium heart healthy    Complete by:  As directed    Increase activity slowly    Complete by:  As directed      Allergies as of 08/18/2016      Reactions   Biaxin [clarithromycin] Shortness Of Breath   Gatifloxacin Shortness Of Breath   Sulfamethoxazole Shortness Of Breath   Sulfonamide Derivatives Shortness Of Breath   Actos [pioglitazone]    Reaction noted by PCP's office   Augmentin [amoxicillin-pot Clavulanate]    Reaction not known by husband   [place] [dapagliflozin]    Reaction unknown by husband   Invokana [canagliflozin]    Reaction unknown by husband   Latex Other (See Comments)   Reaction unknown by husband   Metformin And Related    Reaction noted by PCP's office   Tape Hives   Penicillins Hives, Rash   Has patient had a PCN reaction causing immediate rash, facial/tongue/throat swelling, SOB or lightheadedness with hypotension: Yes Has patient had a PCN reaction causing severe rash involving mucus membranes or skin necrosis: No Has patient had a PCN reaction that required hospitalization: No Has patient had a PCN reaction occurring within the last 10 years: No If all of the above  answers are "NO", then may proceed with Cephalosporin use.      Medication List    STOP taking these medications   ALPRAZolam 1 MG tablet Commonly known as:  XANAX   LINZESS 72 MCG capsule Generic drug:  linaclotide     TAKE these medications   budesonide-formoterol 160-4.5 MCG/ACT inhaler Commonly known as:  SYMBICORT Inhale 2 puffs into the lungs 2 (two) times daily.   CHERRY PO Take 8 oz by mouth 2 (two) times daily.   clopidogrel 75 MG tablet Commonly known as:  PLAVIX Take 75 mg by mouth daily.   furosemide 40 MG tablet Commonly known as:  LASIX Take 1 tablet (40 mg total) by mouth 2 (two) times daily. What changed:  when to take this  additional instructions   gabapentin 300 MG capsule Commonly known as:  NEURONTIN Take 1 capsule (300 mg total) by mouth 3 (three) times daily.   isosorbide mononitrate 20 MG tablet Commonly known as:  ISMO,MONOKET Take 20 mg by mouth daily.   lactulose 10 GM/15ML solution Commonly known as:  CHRONULAC Take 30 mLs (20 g total) by mouth 3 (three) times daily. What changed:  when to take this   LANTUS SOLOSTAR 100 UNIT/ML Solostar Pen Generic drug:  Insulin Glargine INJECT 20 TO 24 UNITS TWICE DAILY What changed:  See the new instructions.   levothyroxine 75 MCG tablet Commonly known as:  SYNTHROID, LEVOTHROID Take 75 mcg by mouth daily. Brand Name Only   magnesium oxide 400 MG tablet Commonly known as:  MAG-OX Take 1 tablet (400 mg total) by mouth daily.   MILK THISTLE PO Take 1 capsule by mouth 2 (two) times daily.   multivitamin with  minerals tablet Take 1 tablet by mouth daily.   NOVOLOG FLEXPEN 100 UNIT/ML FlexPen Generic drug:  insulin aspart INJECT 20 UNITS INTO THE SKIN 3 TIMES DAILY WITH MEALS   pantoprazole 40 MG tablet Commonly known as:  PROTONIX Take 40 mg by mouth daily.   propranolol 60 MG tablet Commonly known as:  INDERAL Take 60 mg by mouth daily.   rifaximin 550 MG Tabs tablet Commonly  known as:  XIFAXAN Take 1 tablet (550 mg total) by mouth 2 (two) times daily.   simvastatin 40 MG tablet Commonly known as:  ZOCOR Take 40 mg by mouth every Monday, Wednesday, and Friday.   spironolactone 100 MG tablet Commonly known as:  ALDACTONE Take 1 tablet (100 mg total) by mouth daily.   traZODone 50 MG tablet Commonly known as:  DESYREL Take 50 mg by mouth at bedtime.   TRULICITY 1.5 NG/2.9BM Sopn Generic drug:  Dulaglutide Inject 0.5 mLs into the skin once a week.   VENTOLIN HFA 108 (90 Base) MCG/ACT inhaler Generic drug:  albuterol Inhale 2 puffs into the lungs every 6 (six) hours as needed for shortness of breath.      Follow-up Information    HAGUE, Rosalyn Charters, MD Follow up in 1 week(s).   Specialty:  Internal Medicine Contact information: [address] [place] [address] [phone]          Allergies  Allergen Reactions  . Biaxin [Clarithromycin] Shortness Of Breath  . Gatifloxacin Shortness Of Breath  . Sulfamethoxazole Shortness Of Breath  . Sulfonamide Derivatives Shortness Of Breath  . Actos [Pioglitazone]     Reaction noted by PCP's office  . Augmentin [Amoxicillin-Pot Clavulanate]     Reaction not known by husband  . Farxiga [Dapagliflozin]     Reaction unknown by husband  . Invokana [Canagliflozin]     Reaction unknown by husband  . Latex Other (See Comments)    Reaction unknown by husband  . Metformin And Related     Reaction noted by PCP's office  . Tape Hives  . Penicillins Hives and Rash    Has patient had a PCN reaction causing immediate rash, facial/tongue/throat swelling, SOB or lightheadedness with hypotension: Yes Has patient had a PCN reaction causing severe rash involving mucus membranes or skin necrosis: No Has patient had a PCN reaction that required hospitalization: No Has patient had a PCN reaction occurring within the last 10 years: No If all of the above answers are "NO", then may proceed with Cephalosporin  use.    Consultations:  PCCM  Procedures (Echo, Carotid, EGD, Colonoscopy, ERCP)   Radiological studies: Ct Head Wo Contrast  Result Date: 08/12/2016 CLINICAL DATA:  66 year old female found unresponsive this morning, intubated. Initial encounter. EXAM: CT HEAD WITHOUT CONTRAST TECHNIQUE: Contiguous axial images were obtained from the base of the skull through the vertex without intravenous contrast. COMPARISON:  Brain MRI 07/25/2016 and earlier. FINDINGS: Brain: Normal for age cerebral volume. No midline shift, ventriculomegaly, mass effect, evidence of mass lesion, intracranial hemorrhage or evidence of cortically based acute infarction. Gray-white matter differentiation is within normal limits throughout the brain. Vascular: Calcified atherosclerosis at the skull base. No suspicious intracranial vascular hyperdensity. Skull: Intact.  No acute osseous abnormality identified. Sinuses/Orbits: Clear. Fluid in the pharynx and posterior nasal cavity in the setting of intubation. Partially visible oral enteric tube. Other: Visualized orbits and scalp soft tissues are within normal limits. IMPRESSION: 1. Stable and normal for age non contrast CT appearance of the  brain. 2. Fluid in the nasopharynx and posterior nasal cavity in the setting of intubation. Electronically Signed   By: Genevie Ann M.D.   On: 08/12/2016 13:35   Ct Head Wo Contrast  Result Date: 07/25/2016 CLINICAL DATA:  Altered mental status. EXAM: CT HEAD WITHOUT CONTRAST TECHNIQUE: Contiguous axial images were obtained from the base of the skull through the vertex without intravenous contrast. COMPARISON:  Head CT scan 05/05/2016. FINDINGS: Brain: Appears normal without hemorrhage, infarct, mass lesion, mass effect, midline shift or abnormal extra-axial fluid collection. No hydrocephalus or pneumocephalus. Vascular: Atherosclerosis noted. Skull: Intact. Sinuses/Orbits: Negative. Other: None. IMPRESSION: No acute abnormality. Atherosclerosis.  Electronically Signed   By: Inge Rise M.D.   On: 07/25/2016 14:45   Mr Brain Wo Contrast  Result Date: 07/25/2016 CLINICAL DATA:  Hepatic encephalopathy. Altered mental status. Cirrhosis. EXAM: MRI HEAD WITHOUT CONTRAST TECHNIQUE: Multiplanar, multiecho pulse sequences of the brain and surrounding structures were obtained without intravenous contrast. COMPARISON:  MRI brain 08/22/2011. CT head without contrast 07/25/2016. FINDINGS: Brain: Mild atrophy and white matter changes are stable. 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 No acute infarct, hemorrhage, or mass lesion is present. The ventricles are of normal size. No significant extraaxial fluid collection is present. The brainstem and cerebellum are normal. The internal auditory canals are within normal limits bilaterally. Vascular: Abnormal signal in the right internal carotid artery is similar to the prior study with slow were likely occluded flow reconstituted at the level of the right posterior communicating artery. There is flow in the MCA branch vessels bilaterally. Flow is present in the left internal carotid artery and both vertebral arteries. Skull and upper cervical spine: The skullbase is within normal limits. The craniocervical junction is normal. Midline sagittal structures are unremarkable. The marrow signal is normal. Sinuses/Orbits: The paranasal sinuses are clear. There is some fluid in the left mastoid air cells. The right mastoid air cells are clear. The globes and orbits are within normal limits. IMPRESSION: 1. No acute intracranial abnormality or significant interval change. 2. 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 3. Chronic occlusion of the right internal carotid artery. Electronically Signed   By: San Morelle M.D.   On: 07/25/2016 20:00   Dg Pelvis Portable  Result Date: 07/25/2016 CLINICAL DATA:  Soft tissue breakdown EXAM: PORTABLE PELVIS  1-2 VIEWS COMPARISON:  03/16/2015 FINDINGS: No pelvic fracture is identified. Scattered large and small bowel gas is seen. A relative mottled appearance is overlying soft tissues is noted which may be related to the known skin breakdown. IMPRESSION: No acute bony abnormality is noted. Electronically Signed   By: Inez Catalina M.D.   On: 07/25/2016 13:49   Dg Chest Port 1 View  Result Date: 08/15/2016 CLINICAL DATA:  Respiratory failure EXAM: PORTABLE CHEST 1 VIEW COMPARISON:  08/14/2016 FINDINGS: Cardiac shadow is stable. The endotracheal tube and nasogastric catheter have been removed in the interval. Mild vascular congestion is noted without interstitial edema. No focal infiltrate is seen. No sizable effusion is noted. IMPRESSION: Vascular congestion without interstitial edema. Electronically Signed   By: Inez Catalina M.D.   On: 08/15/2016 07:27   Dg Chest Port 1 View  Result Date: 08/14/2016 CLINICAL DATA:  Acute respiratory failure EXAM: PORTABLE CHEST 1 VIEW COMPARISON:  Portable chest x-ray of August 13, 2016 FINDINGS: The patient is rotated on today's study. The lungs are well-expanded. The basilar lung markings on the right remain coarse.  On the left the interstitial markings in the mid and lower lung remain increased. The left hemidiaphragm is better demonstrated today. The heart is top-normal in size. The central pulmonary vascularity is prominent. The endotracheal tube tip lies 2.7 cm above the carina. The esophagogastric tube tip projects below the inferior margin of the image. IMPRESSION: Allowing for differences in positioning there has not been significant interval change in the appearance of the chest. Persistent patchy interstitial infiltrates in the left mid and lower lung and at the right lung base. The support tubes are in reasonable position. Electronically Signed   By: David  Martinique M.D.   On: 08/14/2016 07:31   Dg Chest Port 1 View  Result Date: 08/13/2016 CLINICAL DATA:  Respiratory  failure.  Shortness of breath. EXAM: PORTABLE CHEST 1 VIEW COMPARISON:  08/12/2016. FINDINGS: Surgical clips left neck. Endotracheal tube and NG tube in stable position. Heart size normal. Diffuse left lung infiltrate with small left pleural effusion. No pneumothorax. IMPRESSION: 1. Lines and tubes in stable position. 2. Developing diffuse infiltrate left lung. Findings consistent pneumonia . Small left pleural effusion. Electronically Signed   By: Marcello Moores  Register   On: 08/13/2016 07:02   Dg Chest Portable 1 View  Result Date: 08/12/2016 CLINICAL DATA:  Altered mental status.  Unresponsive.  Intubated. EXAM: PORTABLE CHEST 1 VIEW COMPARISON:  07/25/2016 FINDINGS: Endotracheal tube tip is 3 cm above the carina. Nasogastric tube enters the stomach. The lungs are clear. No effusions. No acute bone finding. IMPRESSION: Endotracheal tube and nasogastric tube well positioned. No active disease. Electronically Signed   By: Nelson Chimes M.D.   On: 08/12/2016 12:14   Dg Chest Portable 1 View  Result Date: 07/25/2016 CLINICAL DATA:  Altered mental status EXAM: PORTABLE CHEST 1 VIEW COMPARISON:  07/22/2007 FINDINGS: Resolved atelectasis or pleural fluid at the left base. There is no edema, consolidation, effusion, or pneumothorax. Normal heart size for technique. Artifact from EKG leads overlapping the chest. IMPRESSION: Interval clearing of left basilar atelectasis. No evidence of active disease. Electronically Signed   By: Monte Fantasia M.D.   On: 07/25/2016 13:48   Dg Abd Portable 1v  Result Date: 08/12/2016 CLINICAL DATA:  NG tube placement EXAM: PORTABLE ABDOMEN - 1 VIEW COMPARISON:  None. FINDINGS: There is normal small bowel gas pattern. NG tube in place with tip in distal stomach. IMPRESSION: NG tube in place with tip in distal stomach region. Electronically Signed   By: Lahoma Crocker M.D.   On: 08/12/2016 20:19   Dg Swallowing Func-speech Pathology  Result Date: 08/16/2016 Objective Swallowing  Evaluation: Type of Study: MBS-Modified Barium Swallow Study Patient Details Name: NAKIMA FLUEGGE MRN: [id number] Date of Birth: Mar 30, 1951 Today's Date: 08/16/2016 Time: SLP Start Time (ACUTE ONLY): 1030-SLP Stop Time (ACUTE ONLY): 1100 SLP Time Calculation (min) (ACUTE ONLY): 30 min Past Medical History: Past Medical History: Diagnosis Date . Bilateral lower extremity edema  . Carotid artery bruit   Doppler 07/09/2011 - abnormal study, no change from previous . Chest pain 09/02/2012  Normal, EF-67 . Coronary atherosclerosis of unspecified type of vessel, native or graft  . Endometrial polyp  . Esophageal varices ([place])  . GERD (gastroesophageal reflux disease)  . Hiatal hernia  . Hyperlipidemia  . Hypertension  . Iron deficiency anemia  . Irritable bowel syndrome  . Macular degeneration (senile) of retina, unspecified  . Obesity, unspecified  . Osteoarthrosis, unspecified whether generalized or localized, unspecified site  . PVD (peripheral vascular disease) ([place])   Occluded  right internal carotid artery - 50% left ICA stenosis . SOB (shortness of breath)   Normal 2D echo - EF-58, 03/18/2001 . Tobacco abuse  . Type II or unspecified type diabetes mellitus without mention of complication, not stated as uncontrolled  . Unspecified sleep apnea  Past Surgical History: Past Surgical History: Procedure Laterality Date . APPENDECTOMY   . CARDIAC CATHETERIZATION  03/18/2007  Normal coronary arteries and LV function . CARDIAC CATHETERIZATION  07/01/2001  Normal cath . CARDIAC CATHETERIZATION  05/17/1997  Normal cath . CAROTID ENDARTERECTOMY   . CHOLECYSTECTOMY   HPI: Patient is a 66 yo female admitted 08/12/16 after being found unresponsive by husband. Patient intubated 08/12/16. Patient with acute hepatic encephalopathy, and pna. Patient extubated 08/14/16. PMH: GERD, hiatal hernia, ?esophageal varices, IBS, cirrhosis with NASH, Admit 07/25/16-07/28/16 with acute hepatic encephalopathy, BLE edema, chest pain, HLD, HTN, anemia,  blind Lt eye, decreased vision Rt eye, OA, obesity, PVD, SOB, tobacco use, DM, OSA, CAD, falls at home. Bedside swallowing evaluation 07/26/16 with findings of oropharyngeal swallowing function which appears grossly within functional limits at bedside for baseline diet (soft foods with thin liquids). Mild aspiration risk given dentition, history of GERD, esophageal history. CXR 08/14/16 Persistent patchy interstitial infiltrates in the left mid and lower lung and at the right lung base.  Subjective: "We learned a lot from the last time we saw you." Assessment / Plan / Recommendation CHL IP CLINICAL IMPRESSIONS 08/16/2016 Clinical Impression Patient presents with mild-moderate oropharyngeal dysphagia and suspected primary esophageal dysphagia. Deviations in swallow physiology include reduced pharyngeal peristalsis, with moderate pyriform residue with thin liquids, suspect from suboptimal pressures for complete bolus clearance through the UES. Oral stage of swallowing characterized by lingual pumping, reduced bolus cohesion and piecemeal deglutition with solids. Swallow initation delayed to the pyriform sinuses with thin liquids, valleculae with other consistencies. With thin liquids, penetration during the swallow x2 due to delayed initiation and of residue after the swallow x1. Pt does not sense penetration, but a cued throat clear reduces penetrated material. Small cup sips were better retained in the valleculae and airway protection was consistently adequate with this strategy. Heavier boluses improved clearance through the UES; minimal solid residues noted. With mixed consistency (barium tablet and thin liquid), pt with prolonged bolus holding in the oral cavity, premature spillage to pyriform sinuses resulting in penetration during the swallow. An esophageal sweep was performed in the upright position. The barium tablet paused intermittently in the proximal and mid-thoracic esophagus, and was remarkable for  dysmotility per radiologist. Distal esophagus could not be visualized due to patient's size which limited movement of the equipment. Following the assessment, patient began coughing when reclined to transfer to bed, stated "things get stuck" and "I cough this phlegm up". She endorsed foreign body sensation at sternum. Given the above, recommend pt continue her baseline soft diet (dys 3) with thin liquids via single cup sips only (no straws), medications whole in puree. She may benefit from alternating solids and liquids to improve bolus clearance through the esophagus. She remains at risk for aspiration given esophageal component; may benefit from esophageal assessment to assess pressures, GI referral. SLP Visit Diagnosis Dysphagia, oropharyngeal phase (R13.12);Dysphagia, pharyngoesophageal phase (R13.14) Attention and concentration deficit following -- Frontal lobe and executive function deficit following -- Impact on safety and function Moderate aspiration risk   CHL IP TREATMENT RECOMMENDATION 08/16/2016 Treatment Recommendations Therapy as outlined in treatment plan below   Prognosis 08/16/2016 Prognosis for Safe Diet Advancement Good Barriers to Reach Goals --  Barriers/Prognosis Comment -- CHL IP DIET RECOMMENDATION 08/16/2016 SLP Diet Recommendations Dysphagia 3 (Mech soft) solids;Thin liquid Liquid Administration via No straw;Cup Medication Administration Whole meds with puree Compensations Minimize environmental distractions;Slow rate;Small sips/bites Postural Changes Remain semi-upright after after feeds/meals (Comment);Seated upright at 90 degrees   CHL IP OTHER RECOMMENDATIONS 08/16/2016 Recommended Consults Consider esophageal assessment;Consider GI evaluation Oral Care Recommendations Oral care BID Other Recommendations --   CHL IP FOLLOW UP RECOMMENDATIONS 08/16/2016 Follow up Recommendations Other (comment)   CHL IP FREQUENCY AND DURATION 08/16/2016 Speech Therapy Frequency (ACUTE ONLY) min 1 x/week  Treatment Duration 1 week      CHL IP ORAL PHASE 08/16/2016 Oral Phase Impaired Oral - Pudding Teaspoon -- Oral - Pudding Cup -- Oral - Honey Teaspoon -- Oral - Honey Cup -- Oral - Nectar Teaspoon -- Oral - Nectar Cup Lingual pumping Oral - Nectar Straw -- Oral - Thin Teaspoon -- Oral - Thin Cup Lingual pumping Oral - Thin Straw Lingual pumping Oral - Puree Lingual pumping;Reduced posterior propulsion;Piecemeal swallowing;Decreased bolus cohesion Oral - Mech Soft Lingual pumping;Reduced posterior propulsion;Piecemeal swallowing;Decreased bolus cohesion Oral - Regular Lingual pumping;Reduced posterior propulsion;Piecemeal swallowing;Decreased bolus cohesion Oral - Multi-Consistency Lingual pumping;Holding of bolus Oral - Pill -- Oral Phase - Comment --  CHL IP PHARYNGEAL PHASE 08/16/2016 Pharyngeal Phase Impaired Pharyngeal- Pudding Teaspoon -- Pharyngeal -- Pharyngeal- Pudding Cup -- Pharyngeal -- Pharyngeal- Honey Teaspoon -- Pharyngeal -- Pharyngeal- Honey Cup -- Pharyngeal -- Pharyngeal- Nectar Teaspoon -- Pharyngeal -- Pharyngeal- Nectar Cup Delayed swallow initiation-pyriform sinuses;Reduced pharyngeal peristalsis Pharyngeal -- Pharyngeal- Nectar Straw -- Pharyngeal -- Pharyngeal- Thin Teaspoon -- Pharyngeal -- Pharyngeal- Thin Cup Delayed swallow initiation-pyriform sinuses;Reduced pharyngeal peristalsis;Penetration/Aspiration during swallow;Penetration/Apiration after swallow;Pharyngeal residue - pyriform Pharyngeal Material enters airway, remains ABOVE vocal cords and not ejected out Pharyngeal- Thin Straw Delayed swallow initiation-pyriform sinuses;Reduced pharyngeal peristalsis;Penetration/Aspiration during swallow;Pharyngeal residue - pyriform Pharyngeal Material enters airway, remains ABOVE vocal cords and not ejected out Pharyngeal- Puree Delayed swallow initiation-vallecula;Reduced pharyngeal peristalsis Pharyngeal -- Pharyngeal- Mechanical Soft Delayed swallow initiation-vallecula;Reduced pharyngeal  peristalsis Pharyngeal -- Pharyngeal- Regular Delayed swallow initiation-vallecula;Reduced pharyngeal peristalsis Pharyngeal -- Pharyngeal- Multi-consistency Delayed swallow initiation-pyriform sinuses;Reduced pharyngeal peristalsis;Penetration/Aspiration during swallow;Pharyngeal residue - pyriform Pharyngeal Material enters airway, remains ABOVE vocal cords and not ejected out Pharyngeal- Pill -- Pharyngeal -- Pharyngeal Comment --  CHL IP CERVICAL ESOPHAGEAL PHASE 08/16/2016 Cervical Esophageal Phase Impaired Pudding Teaspoon -- Pudding Cup -- Honey Teaspoon -- Honey Cup -- Nectar Teaspoon -- Nectar Cup -- Nectar Straw -- Thin Teaspoon -- Thin Cup Reduced cricopharyngeal relaxation Thin Straw Reduced cricopharyngeal relaxation Puree -- Mechanical Soft -- Regular -- Multi-consistency -- Pill -- Cervical Esophageal Comment -- No flowsheet data found. Aliene Altes 08/16/2016, 12:13 PM Deneise Lever, MS CF-SLP Speech-Language Pathologist [phone]               Subjective:  Discharge Exam: Vitals:   08/17/16 1625 08/18/16 0017 08/18/16 0643 08/18/16 0647  BP: (!) 124/45 (!) 119/44 106/86   Pulse: (!) 59 60 60   Resp: _0 Temp: 98 F (36.7 C) 97.8 F (36.6 C) 97.7 F (36.5 C)   TempSrc: Oral Oral Oral   SpO2: 98% 99% 96%   Weight:    104.3 kg (230 lb)  Height:       General: Pt is alert, awake, not in acute distress Cardiovascular: RRR, S1/S2 +, no rubs, no gallops Respiratory: CTA bilaterally, no wheezing, no rhonchi Abdominal: Soft, NT, ND, bowel sounds + Extremities: no edema,  no cyanosis   The results of significant diagnostics from this hospitalization (including imaging, microbiology, ancillary and laboratory) are listed below for reference.    Microbiology: Recent Results (from the past 240 hour(s))  MRSA PCR Screening     Status: Abnormal   Collection Time: 08/12/16  6:47 PM  Result Value Ref Range Status   MRSA by PCR POSITIVE (A) NEGATIVE Final    Comment:         The GeneXpert MRSA Assay (FDA approved for NASAL specimens only), is one component of a comprehensive MRSA colonization surveillance program. It is not intended to diagnose MRSA infection nor to guide or monitor treatment for MRSA infections. RESULT CALLED TO, READ BACK BY AND VERIFIED WITH: M GRIFFIN,RN _0  08/13/16 MKELLY,MLT   Culture, respiratory (NON-Expectorated)     Status: None   Collection Time: 08/12/16  6:50 PM  Result Value Ref Range Status   Specimen Description TRACHEAL ASPIRATE  Final   Special Requests NONE  Final   Gram Stain   Final    ABUNDANT WBC PRESENT, PREDOMINANTLY PMN ABUNDANT GRAM NEGATIVE RODS FEW GRAM POSITIVE COCCI IN PAIRS RARE GRAM POSITIVE RODS RARE GRAM NEGATIVE COCCI IN PAIRS    Culture   Final    ABUNDANT HAEMOPHILUS INFLUENZAE BETA LACTAMASE NEGATIVE    Report Status 08/14/2016 FINAL  Final  Culture, blood (Routine X 2) w Reflex to ID Panel     Status: None (Preliminary result)   Collection Time: 08/13/16 10:25 AM  Result Value Ref Range Status   Specimen Description BLOOD LEFT HAND  Final   Special Requests BOTTLES DRAWN AEROBIC ONLY 6CC  Final   Culture NO GROWTH 4 DAYS  Final   Report Status PENDING  Incomplete  Culture, blood (Routine X 2) w Reflex to ID Panel     Status: Abnormal   Collection Time: 08/13/16 10:32 AM  Result Value Ref Range Status   Specimen Description BLOOD RIGHT HAND  Final   Special Requests IN PEDIATRIC BOTTLE 3CC  Final   Culture  Setup Time   Final    GRAM POSITIVE COCCI IN CLUSTERS IN PEDIATRIC BOTTLE CRITICAL RESULT CALLED TO, READ BACK BY AND VERIFIED WITH: Alvino Chapel.D. 15:00 08/14/16 ([email or web address])    Culture (A)  Final    STAPHYLOCOCCUS SPECIES (COAGULASE NEGATIVE) THE SIGNIFICANCE OF ISOLATING THIS ORGANISM FROM A SINGLE SET OF BLOOD CULTURES WHEN MULTIPLE SETS ARE DRAWN IS UNCERTAIN. PLEASE NOTIFY THE MICROBIOLOGY DEPARTMENT WITHIN ONE WEEK IF SPECIATION AND SENSITIVITIES ARE REQUIRED.     Report Status 08/15/2016 FINAL  Final  Blood Culture ID Panel (Reflexed)     Status: Abnormal   Collection Time: 08/13/16 10:32 AM  Result Value Ref Range Status   Enterococcus species NOT DETECTED NOT DETECTED Final   Listeria monocytogenes NOT DETECTED NOT DETECTED Final   Staphylococcus species DETECTED (A) NOT DETECTED Final    Comment: Methicillin (oxacillin) susceptible coagulase negative staphylococcus. Possible blood culture contaminant (unless isolated from more than one blood culture draw or clinical case suggests pathogenicity). No antibiotic treatment is indicated for blood  culture contaminants. CRITICAL RESULT CALLED TO, READ BACK BY AND VERIFIED WITH: Alvino Chapel.D. 15:00 08/14/16 ([email or web address])    Staphylococcus aureus NOT DETECTED NOT DETECTED Final   Methicillin resistance NOT DETECTED NOT DETECTED Final   Streptococcus species NOT DETECTED NOT DETECTED Final   Streptococcus agalactiae NOT DETECTED NOT DETECTED Final   Streptococcus pneumoniae NOT DETECTED NOT DETECTED Final   Streptococcus pyogenes NOT DETECTED NOT  DETECTED Final   Acinetobacter baumannii NOT DETECTED NOT DETECTED Final   Enterobacteriaceae species NOT DETECTED NOT DETECTED Final   Enterobacter cloacae complex NOT DETECTED NOT DETECTED Final   Escherichia coli NOT DETECTED NOT DETECTED Final   Klebsiella oxytoca NOT DETECTED NOT DETECTED Final   Klebsiella pneumoniae NOT DETECTED NOT DETECTED Final   Proteus species NOT DETECTED NOT DETECTED Final   Serratia marcescens NOT DETECTED NOT DETECTED Final   Haemophilus influenzae NOT DETECTED NOT DETECTED Final   Neisseria meningitidis NOT DETECTED NOT DETECTED Final   Pseudomonas aeruginosa NOT DETECTED NOT DETECTED Final   Candida albicans NOT DETECTED NOT DETECTED Final   Candida glabrata NOT DETECTED NOT DETECTED Final   Candida krusei NOT DETECTED NOT DETECTED Final   Candida parapsilosis NOT DETECTED NOT DETECTED Final   Candida tropicalis NOT  DETECTED NOT DETECTED Final     Labs: BNP (last 3 results) No results for input(s): BNP in the last 8760 hours. Basic Metabolic Panel:  Recent Labs Lab 08/12/16 1616 08/13/16 0244 08/13/16 1637 08/14/16 1123 08/15/16 0256 08/17/16 0347 08/18/16 0414  NA  --  138 139 139 141 135 134*  K  --  4.2 4.3 3.9 3.6 3.8 3.9  CL  --  109 112* 111 112* 104 105  CO2  --  18* 16* 17* 19* 21* 21*  GLUCOSE  --  241* 238* 299* 185* 225* 200*  BUN  --  31* 35* 42* 46* 49* 49*  CREATININE  --  2.00* 2.22* 2.28* 2.28* 2.18* 2.14*  CALCIUM  --  7.7* 7.8* 8.1* 8.5* 8.3* 8.5*  MG 1.2* 1.5*  --  1.7 1.6* 2.2  --   PHOS  --  3.0  --  2.8 2.9  --   --    Liver Function Tests:  Recent Labs Lab 08/12/16 1154 08/14/16 1123  AST 25 23  ALT 19 17  ALKPHOS 111 89  BILITOT 0.9 1.0  PROT 5.2* 5.1*  ALBUMIN 2.4* 2.3*   No results for input(s): LIPASE, AMYLASE in the last 168 hours.  Recent Labs Lab 08/12/16 1155  AMMONIA 171*   CBC:  Recent Labs Lab 08/14/16 0347 08/14/16 0725 08/15/16 0256 08/17/16 0347 08/18/16 0414  WBC 16.3* 14.2* 9.0 7.7 8.3  HGB 10.1* 9.9* 9.8* 9.2* 9.1*  HCT 30.7* 30.4* 29.9* 28.1* 27.9*  MCV 83.7 83.7 83.8 83.4 83.0  PLT 69* 76* 70* 67* 73*   Cardiac Enzymes:  Recent Labs Lab 08/12/16 1154  TROPONINI <0.03   BNP: Invalid input(s): POCBNP CBG:  Recent Labs Lab 08/17/16 0756 08/17/16 1149 08/17/16 1719 08/18/16 0016 08/18/16 0756  GLUCAP 179* 250* 238* 180* 177*   D-Dimer No results for input(s): DDIMER in the last 72 hours. Hgb A1c No results for input(s): HGBA1C in the last 72 hours. Lipid Profile No results for input(s): CHOL, HDL, LDLCALC, TRIG, CHOLHDL, LDLDIRECT in the last 72 hours. Thyroid function studies No results for input(s): TSH, T4TOTAL, T3FREE, THYROIDAB in the last 72 hours.  Invalid input(s): FREET3 Anemia work up No results for input(s): VITAMINB12, FOLATE, FERRITIN, TIBC, IRON, RETICCTPCT in the last 72  hours. Urinalysis    Component Value Date/Time   COLORURINE YELLOW 08/12/2016 [place] 08/12/2016 1305   LABSPEC 1.012 08/12/2016 1305   PHURINE 6.0 08/12/2016 1305   GLUCOSEU NEGATIVE 08/12/2016 [place] 08/31/2013 1516   HGBUR NEGATIVE 08/12/2016 1305   BILIRUBINUR NEGATIVE 08/12/2016 1305   [place] 08/12/2016 1305  PROTEINUR NEGATIVE 08/12/2016 1305   UROBILINOGEN 0.2 08/31/2013 1516   NITRITE NEGATIVE 08/12/2016 1305   LEUKOCYTESUR NEGATIVE 08/12/2016 1305   Sepsis Labs Invalid input(s): PROCALCITONIN,  WBC,  LACTICIDVEN Microbiology Recent Results (from the past 240 hour(s))  MRSA PCR Screening     Status: Abnormal   Collection Time: 08/12/16  6:47 PM  Result Value Ref Range Status   MRSA by PCR POSITIVE (A) NEGATIVE Final    Comment:        The GeneXpert MRSA Assay (FDA approved for NASAL specimens only), is one component of a comprehensive MRSA colonization surveillance program. It is not intended to diagnose MRSA infection nor to guide or monitor treatment for MRSA infections. RESULT CALLED TO, READ BACK BY AND VERIFIED WITH: M GRIFFIN,RN _0  08/13/16 MKELLY,MLT   Culture, respiratory (NON-Expectorated)     Status: None   Collection Time: 08/12/16  6:50 PM  Result Value Ref Range Status   Specimen Description TRACHEAL ASPIRATE  Final   Special Requests NONE  Final   Gram Stain   Final    ABUNDANT WBC PRESENT, PREDOMINANTLY PMN ABUNDANT GRAM NEGATIVE RODS FEW GRAM POSITIVE COCCI IN PAIRS RARE GRAM POSITIVE RODS RARE GRAM NEGATIVE COCCI IN PAIRS    Culture   Final    ABUNDANT HAEMOPHILUS INFLUENZAE BETA LACTAMASE NEGATIVE    Report Status 08/14/2016 FINAL  Final  Culture, blood (Routine X 2) w Reflex to ID Panel     Status: None (Preliminary result)   Collection Time: 08/13/16 10:25 AM  Result Value Ref Range Status   Specimen Description BLOOD LEFT HAND  Final   Special Requests BOTTLES DRAWN AEROBIC  ONLY 6CC  Final   Culture NO GROWTH 4 DAYS  Final   Report Status PENDING  Incomplete  Culture, blood (Routine X 2) w Reflex to ID Panel     Status: Abnormal   Collection Time: 08/13/16 10:32 AM  Result Value Ref Range Status   Specimen Description BLOOD RIGHT HAND  Final   Special Requests IN PEDIATRIC BOTTLE 3CC  Final   Culture  Setup Time   Final    GRAM POSITIVE COCCI IN CLUSTERS IN PEDIATRIC BOTTLE CRITICAL RESULT CALLED TO, READ BACK BY AND VERIFIED WITH: Alvino Chapel.D. 15:00 08/14/16 ([email or web address])    Culture (A)  Final    STAPHYLOCOCCUS SPECIES (COAGULASE NEGATIVE) THE SIGNIFICANCE OF ISOLATING THIS ORGANISM FROM A SINGLE SET OF BLOOD CULTURES WHEN MULTIPLE SETS ARE DRAWN IS UNCERTAIN. PLEASE NOTIFY THE MICROBIOLOGY DEPARTMENT WITHIN ONE WEEK IF SPECIATION AND SENSITIVITIES ARE REQUIRED.    Report Status 08/15/2016 FINAL  Final  Blood Culture ID Panel (Reflexed)     Status: Abnormal   Collection Time: 08/13/16 10:32 AM  Result Value Ref Range Status   Enterococcus species NOT DETECTED NOT DETECTED Final   Listeria monocytogenes NOT DETECTED NOT DETECTED Final   Staphylococcus species DETECTED (A) NOT DETECTED Final    Comment: Methicillin (oxacillin) susceptible coagulase negative staphylococcus. Possible blood culture contaminant (unless isolated from more than one blood culture draw or clinical case suggests pathogenicity). No antibiotic treatment is indicated for blood  culture contaminants. CRITICAL RESULT CALLED TO, READ BACK BY AND VERIFIED WITH: Alvino Chapel.D. 15:00 08/14/16 ([email or web address])    Staphylococcus aureus NOT DETECTED NOT DETECTED Final   Methicillin resistance NOT DETECTED NOT DETECTED Final   Streptococcus species NOT DETECTED NOT DETECTED Final   Streptococcus agalactiae NOT DETECTED NOT DETECTED Final   Streptococcus pneumoniae NOT DETECTED NOT DETECTED Final  Streptococcus pyogenes NOT DETECTED NOT DETECTED Final   Acinetobacter baumannii NOT DETECTED NOT  DETECTED Final   Enterobacteriaceae species NOT DETECTED NOT DETECTED Final   Enterobacter cloacae complex NOT DETECTED NOT DETECTED Final   Escherichia coli NOT DETECTED NOT DETECTED Final   Klebsiella oxytoca NOT DETECTED NOT DETECTED Final   Klebsiella pneumoniae NOT DETECTED NOT DETECTED Final   Proteus species NOT DETECTED NOT DETECTED Final   Serratia marcescens NOT DETECTED NOT DETECTED Final   Haemophilus influenzae NOT DETECTED NOT DETECTED Final   Neisseria meningitidis NOT DETECTED NOT DETECTED Final   Pseudomonas aeruginosa NOT DETECTED NOT DETECTED Final   Candida albicans NOT DETECTED NOT DETECTED Final   Candida glabrata NOT DETECTED NOT DETECTED Final   Candida krusei NOT DETECTED NOT DETECTED Final   Candida parapsilosis NOT DETECTED NOT DETECTED Final   Candida tropicalis NOT DETECTED NOT DETECTED Final     Time coordinating discharge: Over 30 minutes  SIGNED:   Birdie Hopes, MD  [facility] 08/18/2016, 10:25 AM Pager   If 7PM-7AM, please contact night-coverage [email or web address] Password [email or web address]

## 2016-08-18 NOTE — Progress Notes (Signed)
Pt and family given discharge instructions, prescriptions, and care notes. Pt verbalized understanding AEB no further questions or concerns at this time. IV was discontinued, no redness, pain, or swelling noted at this time. Pt left the floor via PTAR in stable condition.

## 2016-09-10 ENCOUNTER — Encounter: Payer: Self-pay | Admitting: Cardiovascular Disease

## 2016-09-10 ENCOUNTER — Ambulatory Visit (INDEPENDENT_AMBULATORY_CARE_PROVIDER_SITE_OTHER): Payer: Medicare Other | Admitting: Cardiovascular Disease

## 2016-09-10 VITALS — BP 138/60 | HR 61 | Ht 64.0 in | Wt 213.0 lb

## 2016-09-10 DIAGNOSIS — I779 Disorder of arteries and arterioles, unspecified: Secondary | ICD-10-CM | POA: Diagnosis not present

## 2016-09-10 DIAGNOSIS — R0989 Other specified symptoms and signs involving the circulatory and respiratory systems: Secondary | ICD-10-CM | POA: Diagnosis not present

## 2016-09-10 DIAGNOSIS — I2510 Atherosclerotic heart disease of native coronary artery without angina pectoris: Secondary | ICD-10-CM | POA: Diagnosis not present

## 2016-09-10 DIAGNOSIS — R0600 Dyspnea, unspecified: Secondary | ICD-10-CM | POA: Diagnosis not present

## 2016-09-10 DIAGNOSIS — I739 Peripheral vascular disease, unspecified: Secondary | ICD-10-CM

## 2016-09-10 NOTE — Progress Notes (Signed)
09/10/2016 Monica [name]   05-11-1951  [id number]  Primary Physician Galvin Proffer, MD Primary Cardiologist: Runell Gess MD Roseanne Reno  HPI:  Ms. Gravelle is a 66 year old severely overweight married Caucasian female mother of 2 was accompanied by her husband today. I last saw her in the office 11/06/14.  She has a history of normal coronary arteries by cardiac catheterization in 1998 and again in 2008 after false positive Myoview. She does have peripheral vascular disease with a known occluded right internal carotid artery status post remote left carotid endarterectomy performed by Dr. Madilyn Fireman which we followed by duplex ultrasound most recently earlier this month revealing her endarterectomy site to be widely patent. Her problems include continued tobacco abuse one pack per day, treated hypertension, diabetes and hyperlipidemia. She does complain of dyspnea. She denies chest pain. Her major issue is with anemia for which she is seeing Dr. Yancey Flemings as well as bilateral lower extremity edema. She has gained 40 pounds since I saw her 2 years ago. Recent 2-D echo revealed normal LV systolic function with grade 1 diastolic dysfunction. Her major complaints are of dyspnea on exertion although she does have COPD although she did stop smoking back in February of this year. She was hospitalized earlier this month with acute respiratory failure and hepatic encephalopathy. She has chronic lower extremity edema and her primary care physician recently changed her furosemide to torsemide. She is on spironolactone as well.   Current Outpatient Prescriptions  Medication Sig Dispense Refill  . CHERRY PO Take 8 oz by mouth 2 (two) times daily.    . clopidogrel (PLAVIX) 75 MG tablet Take 75 mg by mouth daily.      . Dulaglutide (TRULICITY) 1.5 MG/0.5ML SOPN Inject 0.5 mLs into the skin once a week.    . furosemide (LASIX) 40 MG tablet Take 1 tablet (40 mg total) by mouth 2 (two) times daily. 60  tablet 2  . lactulose (CHRONULAC) 10 GM/15ML solution Take 30 mLs (20 g total) by mouth 3 (three) times daily. 946 mL 2  . LANTUS SOLOSTAR 100 UNIT/ML Solostar Pen INJECT 20 TO 24 UNITS TWICE DAILY (Patient taking differently: Inject 28 units into the skin two times a day) 15 mL 0  . levothyroxine (SYNTHROID, LEVOTHROID) 75 MCG tablet Take 75 mcg by mouth daily. Brand Name Only    . magnesium oxide (MAG-OX) 400 MG tablet Take 1 tablet (400 mg total) by mouth daily. 30 tablet 2  . MILK THISTLE PO Take 1 capsule by mouth 2 (two) times daily.    . Multiple Vitamins-Minerals (MULTIVITAMIN WITH MINERALS) tablet Take 1 tablet by mouth daily. 30 tablet 0  . pantoprazole (PROTONIX) 40 MG tablet Take 40 mg by mouth daily.     . propranolol (INDERAL) 60 MG tablet Take 60 mg by mouth daily.     . rifaximin (XIFAXAN) 550 MG TABS tablet Take 1 tablet (550 mg total) by mouth 2 (two) times daily. 60 tablet 0  . simvastatin (ZOCOR) 40 MG tablet Take 40 mg by mouth every Monday, Wednesday, and Friday.     . VENTOLIN HFA 108 (90 BASE) MCG/ACT inhaler Inhale 2 puffs into the lungs every 6 (six) hours as needed for shortness of breath.      No current facility-administered medications for this visit.     Allergies  Allergen Reactions  . Biaxin [Clarithromycin] Shortness Of Breath  . Gatifloxacin Shortness Of Breath  . Sulfamethoxazole Shortness Of  Breath  . Sulfonamide Derivatives Shortness Of Breath  . Actos [Pioglitazone]     Reaction noted by PCP's office  . Augmentin [Amoxicillin-Pot Clavulanate]     Reaction not known by husband  . Farxiga [Dapagliflozin]     Reaction unknown by husband  . Invokana [Canagliflozin]     Reaction unknown by husband  . Latex Other (See Comments)    Reaction unknown by husband  . Metformin And Related     Reaction noted by PCP's office  . Tape Hives  . Penicillins Hives and Rash    Has patient had a PCN reaction causing immediate rash, facial/tongue/throat swelling,  SOB or lightheadedness with hypotension: Yes Has patient had a PCN reaction causing severe rash involving mucus membranes or skin necrosis: No Has patient had a PCN reaction that required hospitalization: No Has patient had a PCN reaction occurring within the last 10 years: No If all of the above answers are "NO", then may proceed with Cephalosporin use.    Social History   Social History  . Marital status: Married    Spouse name: N/A  . Number of children: 2  . Years of education: N/A   Occupational History  . Retired    Social History Main Topics  . Smoking status: Current Every Day Smoker    Packs/day: 1.00    Types: Cigarettes  . Smokeless tobacco: Never Used     Comment: form given on 05-26-11  . Alcohol use No  . Drug use: No  . Sexual activity: Not on file   Other Topics Concern  . Not on file   Social History Narrative  . No narrative on file     Review of Systems: General: negative for chills, fever, night sweats or weight changes.  Cardiovascular: negative for chest pain, dyspnea on exertion, edema, orthopnea, palpitations, paroxysmal nocturnal dyspnea or shortness of breath Dermatological: negative for rash Respiratory: negative for cough or wheezing Urologic: negative for hematuria Abdominal: negative for nausea, vomiting, diarrhea, bright red blood per rectum, melena, or hematemesis Neurologic: negative for visual changes, syncope, or dizziness All other systems reviewed and are otherwise negative except as noted above.    Blood pressure 138/60, pulse 61, height  (1.626 m), weight 213 lb (96.6 kg).  General appearance: alert and no distress Neck: no adenopathy, no JVD, supple, symmetrical, trachea midline, thyroid not enlarged, symmetric, no tenderness/mass/nodules and Soft left carotid bruit Lungs: clear to auscultation bilaterally Heart: regular rate and rhythm, S1, S2 normal, no murmur, click, rub or gallop Extremities: 2-3+ pitting edema  bilaterally  EKG sinus rhythm at 61 with poor R-wave progression. I Personally reviewed this EKG.  ASSESSMENT AND PLAN:   Coronary atherosclerosis Ms. Skeens has had multiple cardiac catheterizations most recently 2008 normal coronary arteries demonstrated by cath  Bilateral carotid artery disease (HCC) History of carotid artery disease status post remote left carotid endarterectomy performed by Dr. Madilyn Fireman. Will recheck carotid Doppler studies. She does have a left carotid bruit.  Bilateral lower extremity edema History of bilateral lower extremity edema wearing compression stockings. Her furosemide was recently changed to torsemide and she is on spironolactone. Her primary care physician is following her laboratory exam.  Tobacco abuse Long history of tobacco abuse having quit in February of this year      Runell Gess MD [facility], [facility] 09/10/2016 3:49 PM

## 2016-09-10 NOTE — Assessment & Plan Note (Signed)
History of carotid artery disease status post remote left carotid endarterectomy performed by Dr. Madilyn Fireman. Will recheck carotid Doppler studies. She does have a left carotid bruit.

## 2016-09-10 NOTE — Patient Instructions (Addendum)
Medication Instructions: STOP Isosorbide   Labwork:  Your physician recommends that you return for lab work: BMET   Testing/Procedures: Your physician has requested that you have an echocardiogram. Echocardiography is a painless test that uses sound waves to create images of your heart. It provides your doctor with information about the size and shape of your heart and how well your heart's chambers and valves are working. This procedure takes approximately one hour. There are no restrictions for this procedure.  Your physician has requested that you have a carotid duplex. This test is an ultrasound of the carotid arteries in your neck. It looks at blood flow through these arteries that supply the brain with blood. Allow one hour for this exam. There are no restrictions or special instructions.  Follow-Up: We request that you follow-up in: 3 months with an extender and in 6 months with Dr San Morelle will receive a reminder letter in the mail two months in advance. If you don't receive a letter, please call our office to schedule the follow-up appointment.   If you need a refill on your cardiac medications before your next appointment, please call your pharmacy.

## 2016-09-10 NOTE — Assessment & Plan Note (Signed)
History of bilateral lower extremity edema wearing compression stockings. Her furosemide was recently changed to torsemide and she is on spironolactone. Her primary care physician is following her laboratory exam.

## 2016-09-10 NOTE — Assessment & Plan Note (Signed)
Monica [name] has had multiple cardiac catheterizations most recently 2008 normal coronary arteries demonstrated by cath

## 2016-09-10 NOTE — Assessment & Plan Note (Signed)
Long history of tobacco abuse having quit in February of this year

## 2016-09-17 ENCOUNTER — Emergency Department (HOSPITAL_COMMUNITY): Payer: Medicare Other

## 2016-09-17 ENCOUNTER — Encounter (HOSPITAL_COMMUNITY): Payer: Self-pay | Admitting: Emergency Medicine

## 2016-09-17 ENCOUNTER — Inpatient Hospital Stay (HOSPITAL_COMMUNITY): Payer: Medicare Other

## 2016-09-17 ENCOUNTER — Inpatient Hospital Stay (HOSPITAL_COMMUNITY)
Admission: EM | Admit: 2016-09-17 | Discharge: 2016-09-19 | DRG: 442 | Disposition: A | Discharge status: Home / Self Care | Payer: Medicare Other | Attending: Family Medicine | Admitting: Family Medicine

## 2016-09-17 DIAGNOSIS — D638 Anemia in other chronic diseases classified elsewhere: Secondary | ICD-10-CM | POA: Diagnosis present

## 2016-09-17 DIAGNOSIS — M1990 Unspecified osteoarthritis, unspecified site: Secondary | ICD-10-CM | POA: Diagnosis present

## 2016-09-17 DIAGNOSIS — D649 Anemia, unspecified: Secondary | ICD-10-CM | POA: Diagnosis not present

## 2016-09-17 DIAGNOSIS — G4730 Sleep apnea, unspecified: Secondary | ICD-10-CM | POA: Diagnosis present

## 2016-09-17 DIAGNOSIS — I2510 Atherosclerotic heart disease of native coronary artery without angina pectoris: Secondary | ICD-10-CM | POA: Diagnosis present

## 2016-09-17 DIAGNOSIS — N184 Chronic kidney disease, stage 4 (severe): Secondary | ICD-10-CM | POA: Diagnosis present

## 2016-09-17 DIAGNOSIS — K7460 Unspecified cirrhosis of liver: Secondary | ICD-10-CM | POA: Diagnosis present

## 2016-09-17 DIAGNOSIS — R197 Diarrhea, unspecified: Secondary | ICD-10-CM | POA: Diagnosis present

## 2016-09-17 DIAGNOSIS — E785 Hyperlipidemia, unspecified: Secondary | ICD-10-CM | POA: Diagnosis present

## 2016-09-17 DIAGNOSIS — I5032 Chronic diastolic (congestive) heart failure: Secondary | ICD-10-CM | POA: Diagnosis present

## 2016-09-17 DIAGNOSIS — K219 Gastro-esophageal reflux disease without esophagitis: Secondary | ICD-10-CM | POA: Diagnosis present

## 2016-09-17 DIAGNOSIS — E860 Dehydration: Secondary | ICD-10-CM | POA: Diagnosis present

## 2016-09-17 DIAGNOSIS — K580 Irritable bowel syndrome with diarrhea: Secondary | ICD-10-CM | POA: Diagnosis present

## 2016-09-17 DIAGNOSIS — Z888 Allergy status to other drugs, medicaments and biological substances status: Secondary | ICD-10-CM

## 2016-09-17 DIAGNOSIS — Z794 Long term (current) use of insulin: Secondary | ICD-10-CM | POA: Diagnosis not present

## 2016-09-17 DIAGNOSIS — Z6831 Body mass index (BMI) 31.0-31.9, adult: Secondary | ICD-10-CM

## 2016-09-17 DIAGNOSIS — Z882 Allergy status to sulfonamides status: Secondary | ICD-10-CM

## 2016-09-17 DIAGNOSIS — D696 Thrombocytopenia, unspecified: Secondary | ICD-10-CM | POA: Diagnosis not present

## 2016-09-17 DIAGNOSIS — E875 Hyperkalemia: Secondary | ICD-10-CM | POA: Diagnosis present

## 2016-09-17 DIAGNOSIS — K7200 Acute and subacute hepatic failure without coma: Principal | ICD-10-CM | POA: Diagnosis present

## 2016-09-17 DIAGNOSIS — Z881 Allergy status to other antibiotic agents status: Secondary | ICD-10-CM

## 2016-09-17 DIAGNOSIS — Z7902 Long term (current) use of antithrombotics/antiplatelets: Secondary | ICD-10-CM

## 2016-09-17 DIAGNOSIS — I8500 Esophageal varices without bleeding: Secondary | ICD-10-CM | POA: Diagnosis present

## 2016-09-17 DIAGNOSIS — E1151 Type 2 diabetes mellitus with diabetic peripheral angiopathy without gangrene: Secondary | ICD-10-CM | POA: Diagnosis present

## 2016-09-17 DIAGNOSIS — K7290 Hepatic failure, unspecified without coma: Secondary | ICD-10-CM

## 2016-09-17 DIAGNOSIS — E1122 Type 2 diabetes mellitus with diabetic chronic kidney disease: Secondary | ICD-10-CM | POA: Diagnosis not present

## 2016-09-17 DIAGNOSIS — E039 Hypothyroidism, unspecified: Secondary | ICD-10-CM | POA: Diagnosis not present

## 2016-09-17 DIAGNOSIS — K7682 Hepatic encephalopathy: Secondary | ICD-10-CM | POA: Diagnosis present

## 2016-09-17 DIAGNOSIS — Z91040 Latex allergy status: Secondary | ICD-10-CM

## 2016-09-17 DIAGNOSIS — F17210 Nicotine dependence, cigarettes, uncomplicated: Secondary | ICD-10-CM | POA: Diagnosis present

## 2016-09-17 DIAGNOSIS — Z880 Allergy status to penicillin: Secondary | ICD-10-CM

## 2016-09-17 DIAGNOSIS — H3530 Unspecified macular degeneration: Secondary | ICD-10-CM | POA: Diagnosis present

## 2016-09-17 DIAGNOSIS — I8510 Secondary esophageal varices without bleeding: Secondary | ICD-10-CM | POA: Diagnosis not present

## 2016-09-17 DIAGNOSIS — I130 Hypertensive heart and chronic kidney disease with heart failure and stage 1 through stage 4 chronic kidney disease, or unspecified chronic kidney disease: Secondary | ICD-10-CM | POA: Diagnosis present

## 2016-09-17 DIAGNOSIS — N19 Unspecified kidney failure: Secondary | ICD-10-CM

## 2016-09-17 DIAGNOSIS — K7581 Nonalcoholic steatohepatitis (NASH): Secondary | ICD-10-CM | POA: Diagnosis present

## 2016-09-17 DIAGNOSIS — E669 Obesity, unspecified: Secondary | ICD-10-CM | POA: Diagnosis present

## 2016-09-17 DIAGNOSIS — Z515 Encounter for palliative care: Secondary | ICD-10-CM | POA: Diagnosis not present

## 2016-09-17 DIAGNOSIS — N179 Acute kidney failure, unspecified: Secondary | ICD-10-CM | POA: Diagnosis present

## 2016-09-17 DIAGNOSIS — Z91048 Other nonmedicinal substance allergy status: Secondary | ICD-10-CM

## 2016-09-17 DIAGNOSIS — Z79899 Other long term (current) drug therapy: Secondary | ICD-10-CM

## 2016-09-17 LAB — BLOOD GAS, VENOUS
Acid-Base Excess: 0 mmol/L (ref 0.0–2.0)
Bicarbonate: 24.4 mmol/L (ref 20.0–28.0)
O2 Saturation: 31.4 %
PCO2 VEN: 41 mmHg — AB (ref 44.0–60.0)
PH VEN: 7.392 (ref 7.250–7.430)
Patient temperature: 98.6

## 2016-09-17 LAB — CBC WITH DIFFERENTIAL/PLATELET
Basophils Absolute: 0 10*3/uL (ref 0.0–0.1)
Basophils Relative: 0 %
EOS ABS: 0.1 10*3/uL (ref 0.0–0.7)
EOS PCT: 1 %
HCT: 31.6 % — ABNORMAL LOW (ref 36.0–46.0)
HEMOGLOBIN: 10.7 g/dL — AB (ref 12.0–15.0)
LYMPHS ABS: 1.1 10*3/uL (ref 0.7–4.0)
LYMPHS PCT: 17 %
MCH: 28.1 pg (ref 26.0–34.0)
MCHC: 33.9 g/dL (ref 30.0–36.0)
MCV: 82.9 fL (ref 78.0–100.0)
MONOS PCT: 6 %
Monocytes Absolute: 0.4 10*3/uL (ref 0.1–1.0)
Neutro Abs: 4.9 10*3/uL (ref 1.7–7.7)
Neutrophils Relative %: 76 %
Platelets: 78 10*3/uL — ABNORMAL LOW (ref 150–400)
RBC: 3.81 MIL/uL — AB (ref 3.87–5.11)
RDW: 17.8 % — ABNORMAL HIGH (ref 11.5–15.5)
WBC: 6.5 10*3/uL (ref 4.0–10.5)

## 2016-09-17 LAB — COMPREHENSIVE METABOLIC PANEL
ALBUMIN: 3.6 g/dL (ref 3.5–5.0)
ALT: 14 U/L (ref 14–54)
AST: 31 U/L (ref 15–41)
Alkaline Phosphatase: 97 U/L (ref 38–126)
Anion gap: 12 (ref 5–15)
BUN: 48 mg/dL — AB (ref 6–20)
CHLORIDE: 106 mmol/L (ref 101–111)
CO2: 23 mmol/L (ref 22–32)
CREATININE: 3.64 mg/dL — AB (ref 0.44–1.00)
Calcium: 8.1 mg/dL — ABNORMAL LOW (ref 8.9–10.3)
GFR calc Af Amer: 14 mL/min — ABNORMAL LOW (ref >60)
GFR calc non Af Amer: 12 mL/min — ABNORMAL LOW (ref >60)
Glucose, Bld: 120 mg/dL — ABNORMAL HIGH (ref 65–99)
POTASSIUM: 5.2 mmol/L — AB (ref 3.5–5.1)
SODIUM: 141 mmol/L (ref 135–145)
Total Bilirubin: 1.2 mg/dL (ref 0.3–1.2)
Total Protein: 7.2 g/dL (ref 6.5–8.1)

## 2016-09-17 LAB — PROTIME-INR
INR: 1.37
PROTHROMBIN TIME: 17 s — AB (ref 11.4–15.2)

## 2016-09-17 LAB — CBG MONITORING, ED: GLUCOSE-CAPILLARY: 102 mg/dL — AB (ref 65–99)

## 2016-09-17 LAB — ETHANOL: Alcohol, Ethyl (B): <5 mg/dL (ref <5)

## 2016-09-17 LAB — AMMONIA: Ammonia: 82 umol/L — ABNORMAL HIGH (ref 9–35)

## 2016-09-17 MED ORDER — SODIUM CHLORIDE 0.9 % IV SOLN
INTRAVENOUS | Status: DC
  Administered 2016-09-17: 21:00:00 via INTRAVENOUS

## 2016-09-17 MED ORDER — LACTULOSE ENEMA
300.0000 mL | Freq: Two times a day (BID) | ORAL | Status: DC
  Filled 2016-09-17 (×2): qty 300

## 2016-09-17 MED ORDER — LEVOTHYROXINE SODIUM 100 MCG IV SOLR: 40.0000 ug | Freq: Every day | INTRAVENOUS | Status: DC

## 2016-09-17 MED ORDER — LACTULOSE ENEMA
300.0000 mL | Freq: Once | ORAL | Status: DC
  Filled 2016-09-17 (×3): qty 300

## 2016-09-17 MED ORDER — INSULIN GLARGINE 100 UNIT/ML ~~LOC~~ SOLN
10.0000 [IU] | Freq: Every day | SUBCUTANEOUS | Status: DC
  Filled 2016-09-17: qty 0.1

## 2016-09-17 MED ORDER — ONDANSETRON HCL 4 MG/2ML IJ SOLN: 4.0000 mg | Freq: Four times a day (QID) | INTRAMUSCULAR | Status: DC | PRN

## 2016-09-17 MED ORDER — ONDANSETRON HCL 4 MG PO TABS: 4.0000 mg | ORAL_TABLET | Freq: Four times a day (QID) | ORAL | Status: DC | PRN

## 2016-09-17 MED ORDER — SODIUM CHLORIDE 0.9% FLUSH
3.0000 mL | Freq: Two times a day (BID) | INTRAVENOUS | Status: DC
  Administered 2016-09-17 – 2016-09-19 (×3): 3 mL via INTRAVENOUS

## 2016-09-17 MED ORDER — SODIUM CHLORIDE 0.9 % IV BOLUS (SEPSIS)
1000.0000 mL | Freq: Once | INTRAVENOUS | Status: AC
  Administered 2016-09-17: 1000 mL via INTRAVENOUS

## 2016-09-17 MED ORDER — METOPROLOL TARTRATE 5 MG/5ML IV SOLN
2.5000 mg | Freq: Four times a day (QID) | INTRAVENOUS | Status: DC
  Administered 2016-09-17 – 2016-09-18 (×3): 2.5 mg via INTRAVENOUS
  Filled 2016-09-17 (×3): qty 5

## 2016-09-17 NOTE — ED Notes (Signed)
Attempted to pull blood for lab off IV which was unsuccessful

## 2016-09-17 NOTE — ED Notes (Signed)
ED Provider at bedside, but family left

## 2016-09-17 NOTE — ED Notes (Signed)
EDP wants portable chest x ray done prior to transport

## 2016-09-17 NOTE — Progress Notes (Signed)
On shift assessment, RN noticed that patient has 2 rings on her left hand.  1.-yellow band with 5 miniature whiet stones. 2-yellow band with 1 round 0.5 cm white stone.

## 2016-09-17 NOTE — ED Triage Notes (Signed)
Per EMS. Pt from home. Hx of elevated ammonia levels. Has had AMS since 0600. Family reports her AMS is typical for when her ammonia levels become elevated. Hx of non-compliance with lactulose. Pt disoriented 4x. Was combative and tried to get off the stretcher multiple times during transport.

## 2016-09-17 NOTE — ED Notes (Signed)
flexiseal came out.

## 2016-09-17 NOTE — H&P (Addendum)
History and Physical    Monica [name]:[id number] DOB: [birth year] DOA: 09/17/2016  PCP: Galvin Proffer, MD Patient coming from: Home  Chief Complaint: Altered mental status  HPI: Monica [name] is a 66 y.o. female with medical history significant of CAD, PVD, sleep apnea, diabetes mellitus, GERD, arthritis, NASH. Patient is unable to provide a history secondary to significant confusion. Per family, patient woke up today not acting herself. She spit up her medication this morning (no emesis). Yesterday, she was at her baseline. Recently, she had started taking her lactulose every other day per her cardiologist's recommendations. She was recently admitted for a similar presentation and intubated. She was discharged to SNF per patient's family and resided there for two weeks before returning home. She did very well, so her current presentation is unexpected for them.  ED Course: Vitals: Afebrile. Normal pulse, soft blood pressure. SpO2 of 100% on room air Labs: Hemoglobin 10.7, platelets of 78, potassium of 5.2, BUN of 48, creatinine of 3.64, ammonia of 82, pCO2 vein of 41, pO2 vein undetectable Imaging: CT unremarkable for acute abnormality Medications/Course: Lactulose enema, 1L NS bolus  Review of Systems: Review of Systems  Unable to perform ROS: Mental status change    Past Medical History:  Diagnosis Date  . Bilateral lower extremity edema   . Carotid artery bruit    Doppler 07/09/2011 - abnormal study, no change from previous  . Chest pain 09/02/2012   Normal, EF-67  . Coronary atherosclerosis of unspecified type of vessel, native or graft   . Endometrial polyp   . Esophageal varices (HCC)   . GERD (gastroesophageal reflux disease)   . Hiatal hernia   . Hyperlipidemia   . Hypertension   . Iron deficiency anemia   . Irritable bowel syndrome   . Macular degeneration (senile) of retina, unspecified   . Obesity, unspecified   . Osteoarthrosis, unspecified whether generalized  or localized, unspecified site   . 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 SOB (shortness of breath)    Normal 2D echo - EF-58, 03/18/2001  . Tobacco abuse   . Type II or unspecified type diabetes mellitus without mention of complication, not stated as uncontrolled   . Unspecified sleep apnea     Past Surgical History:  Procedure Laterality Date  . APPENDECTOMY    . CARDIAC CATHETERIZATION  03/18/2007   Normal coronary arteries and LV function  . CARDIAC CATHETERIZATION  07/01/2001   Normal cath  . CARDIAC CATHETERIZATION  05/17/1997   Normal cath  . CAROTID ENDARTERECTOMY    . CHOLECYSTECTOMY       reports that she has been smoking Cigarettes.  She has been smoking about 1.00 pack per day. She has never used smokeless tobacco. She reports that she does not drink alcohol or use drugs.  Allergies  Allergen Reactions  . Biaxin [Clarithromycin] Shortness Of Breath  . Gatifloxacin Shortness Of Breath  . Sulfamethoxazole Shortness Of Breath  . Sulfonamide Derivatives Shortness Of Breath  . Actos [Pioglitazone]     Reaction noted by PCP's office  . Augmentin [Amoxicillin-Pot Clavulanate]     Reaction not known by husband  . Farxiga [Dapagliflozin]     Reaction unknown by husband  . Invokana [Canagliflozin]     Reaction unknown by husband  . Latex Other (See Comments)    Reaction unknown by husband  . Metformin And Related  Reaction noted by PCP's office  . Tape Hives  . Penicillins Hives and Rash    Has patient had a PCN reaction causing immediate rash, facial/tongue/throat swelling, SOB or lightheadedness with hypotension: Yes Has patient had a PCN reaction causing severe rash involving mucus membranes or skin necrosis: No Has patient had a PCN reaction that required hospitalization: No Has patient had a PCN reaction occurring within the last 10 years: No If all of the above answers are "NO", then may  proceed with Cephalosporin use.    Family History  Problem Relation Age of Onset  . Breast cancer Mother   . Diabetes Father   . Breast cancer Sister   . Diabetes Sister   . Diabetes Brother   . Colon cancer Paternal Aunt   . Stomach cancer Paternal Aunt    Prior to Admission medications   Medication Sig Start Date End Date Taking? Authorizing Provider  CHERRY PO Take 8 oz by mouth 2 (two) times daily.    Historical Provider, MD  clopidogrel (PLAVIX) 75 MG tablet Take 75 mg by mouth daily.      Historical Provider, MD  Dulaglutide (TRULICITY) 1.5 MG/0.5ML SOPN Inject 0.5 mLs into the skin once a week.    Historical Provider, MD  furosemide (LASIX) 40 MG tablet Take 1 tablet (40 mg total) by mouth 2 (two) times daily. 08/18/16   Clydia Llano, MD  lactulose (CHRONULAC) 10 GM/15ML solution Take 30 mLs (20 g total) by mouth 3 (three) times daily. 08/18/16   Clydia Llano, MD  LANTUS SOLOSTAR 100 UNIT/ML Solostar Pen INJECT 20 TO 24 UNITS TWICE DAILY Patient taking differently: Inject 28 units into the skin two times a day 06/21/14   Reather Littler, MD  levothyroxine (SYNTHROID, LEVOTHROID) 75 MCG tablet Take 75 mcg by mouth daily. Brand Name Only    Historical Provider, MD  magnesium oxide (MAG-OX) 400 MG tablet Take 1 tablet (400 mg total) by mouth daily. 08/18/16   Clydia Llano, MD  MILK THISTLE PO Take 1 capsule by mouth 2 (two) times daily.    Historical Provider, MD  Multiple Vitamins-Minerals (MULTIVITAMIN WITH MINERALS) tablet Take 1 tablet by mouth daily. 08/18/16   Clydia Llano, MD  pantoprazole (PROTONIX) 40 MG tablet Take 40 mg by mouth daily.  07/18/14   Historical Provider, MD  propranolol (INDERAL) 60 MG tablet Take 60 mg by mouth daily.  02/20/15   Historical Provider, MD  rifaximin (XIFAXAN) 550 MG TABS tablet Take 1 tablet (550 mg total) by mouth 2 (two) times daily. 08/18/16   Clydia Llano, MD  simvastatin (ZOCOR) 40 MG tablet Take 40 mg by mouth every Monday, Wednesday, and Friday.      Historical Provider, MD  VENTOLIN HFA 108 (90 BASE) MCG/ACT inhaler Inhale 2 puffs into the lungs every 6 (six) hours as needed for shortness of breath.  02/08/15   Historical Provider, MD    Physical Exam: Vitals:   09/17/16 1430 09/17/16 1533 09/17/16 1535 09/17/16 1654  BP: (!) 151/58 (!) 144/54  (!) 106/46  Pulse: 83  83 77  Resp: (!) 29  (!) 24 16  Temp:      TempSrc:      SpO2: 100%  100% 99%     Constitutional: NAD, calm, comfortable Eyes: PERRL, lids and conjunctivae normal ENMT: Mucous membranes are dry. Posterior pharynx clear of any exudate or lesions.  Neck: normal, supple, no masses, no thyromegaly. Left vertical incision scar Respiratory: clear to auscultation bilaterally, no  wheezing, no crackles. Normal respiratory effort. No accessory muscle use.  Cardiovascular: Regular rate and rhythm, no murmurs / rubs / gallops. Trace lower extremity edema. 2+ pedal pulses.  Abdomen: no tenderness, no masses palpated. No hepatosplenomegaly. Bowel sounds positive. Difficult to assess for ascites. Musculoskeletal: no clubbing / cyanosis. No joint deformity upper and lower extremities. Good ROM, no contractures. Normal muscle tone.  Skin: no rashes, lesions, ulcers. No induration Neurologic: CN unable to be examined secondary to patient mental status. Sensation intact, DTR normal. Strength could not be assessed. Psychiatric: Impaired judgment and insight. Alert to verbal command but not oriented. Normal mood.    Labs on Admission: I have personally reviewed following labs and imaging studies  CBC:  Recent Labs Lab 09/17/16 1502  WBC 6.5  NEUTROABS 4.9  HGB 10.7*  HCT 31.6*  MCV 82.9  PLT 78*   Basic Metabolic Panel:  Recent Labs Lab 09/17/16 1502  NA 141  K 5.2*  CL 106  CO2 23  GLUCOSE 120*  BUN 48*  CREATININE 3.64*  CALCIUM 8.1*   GFR: Estimated Creatinine Clearance: 17.2 mL/min (A) (by C-G formula based on SCr of 3.64 mg/dL (H)).   Liver Function  Tests:  Recent Labs Lab 09/17/16 1502  AST 31  ALT 14  ALKPHOS 97  BILITOT 1.2  PROT 7.2  ALBUMIN 3.6    Recent Labs Lab 09/17/16 1619  AMMONIA 82*   Coagulation Profile:  Recent Labs Lab 09/17/16 1502  INR 1.37   CBG:  Recent Labs Lab 09/17/16 1515  GLUCAP 102*   Urine analysis:    Component Value Date/Time   COLORURINE YELLOW 08/12/2016 1305   APPEARANCEUR CLEAR 08/12/2016 1305   LABSPEC 1.012 08/12/2016 1305   PHURINE 6.0 08/12/2016 1305   GLUCOSEU NEGATIVE 08/12/2016 1305   GLUCOSEU NEGATIVE 08/31/2013 1516   HGBUR NEGATIVE 08/12/2016 1305   BILIRUBINUR NEGATIVE 08/12/2016 1305   KETONESUR NEGATIVE 08/12/2016 1305   PROTEINUR NEGATIVE 08/12/2016 1305   UROBILINOGEN 0.2 08/31/2013 1516   NITRITE NEGATIVE 08/12/2016 1305   LEUKOCYTESUR NEGATIVE 08/12/2016 1305   Radiological Exams on Admission: Ct Head Wo Contrast  Result Date: 09/17/2016 CLINICAL DATA:  AMS. Hx of elevated ammonia levels. Has had AMS since 0600. Family reports her AMS is typical for when her ammonia levels become elevated. Hx of non-compliance with lactulose. Pt disoriented 4x. combative Unable to hold still EXAM: CT HEAD WITHOUT CONTRAST TECHNIQUE: Contiguous axial images were obtained from the base of the skull through the vertex without intravenous contrast. COMPARISON:  08/12/2016 FINDINGS: Brain: No evidence of acute infarction, hemorrhage, hydrocephalus, extra-axial collection or mass lesion/mass effect. Vascular: There is atherosclerotic calcification of the carotid siphonsand vertebral arteries. Skull: Normal. Negative for fracture or focal lesion. Sinuses/Orbits: No acute finding. Other: There is significant patient motion artifact. IMPRESSION: 1.  No evidence for acute  abnormality. 2. Patient motion artifact Electronically Signed   By: Norva Pavlov M.D.   On: 09/17/2016 15:55    EKG: Independently reviewed. Sinus rhythm.  Assessment/Plan Active Problems:   Type 2 diabetes  mellitus with stage 3 chronic kidney disease (HCC)   GERD   Hypothyroidism   Esophageal varices without bleeding (HCC)   Hepatic encephalopathy (HCC)   Thrombocytopenia (HCC)   Normocytic anemia  Acute encephalopathy Likely hepatic encephalopathy as this is a recurrent issue. Patient not adherent with medications as an outpatient. Ammonia elevated to 82 this admission. Given one dose of lactulose enema in ED. EKG and head CT unremarkable. Chest  x-ray pending. 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 INR 1.37. Thrombocytopenia. -continue betablocker -palliative care  Essential hypertension -hold oral antihypertensives -metoprolol IV 2.5mg  q6 hours  Diabetes mellitus, insulin dependent -continue Lantus at reduced dose of 5 units daily initially and titrate up while patient is NPO -SSI q4 hours  Acute kidney injury on CKD 3 Baseline of 2.2. Presented with a  Creatinine of 3.64 today. Likely secondary to dehydration in setting of diuresis, decreased oral intake and frequent stools. -IV fluids for 12 hours -repeat BMP in AM  Chronic diastolic heart failure Last EF of 60-65% with no regional wall motion abnormalities and grade 1 diastolic dysfunction seen on echo from 10/27/2014. Currently on dryer side -hold diuretic  Thrombocytopenia Chronic and stable. No evidence of bleeding.  Anemia of chronic disease Normocytic. Hemoglobin stable  Hyperkalemia Likely secondary to acute kidney injury. Very mild. No EKG changes -recheck BMP  Hypothyroidism -continue Synthroid   DVT prophylaxis: SCDs secondary to thrombocytopenia  Code Status: Full code Family Communication: Husband, son, daughter, daughter in law Disposition Plan: Pending clinical improvement Consults called: None Admission status: Inpatient, Stepdown   Jacquelin Hawking, MD [facility] Pager [phone]  If 7PM-7AM,  please contact night-coverage [email or web address] Password [email or web address]  09/17/2016, 5:11 PM

## 2016-09-17 NOTE — ED Notes (Signed)
Bed: KG40 Expected date:  Expected time:  Means of arrival:  Comments: EMS/hepatic dis.

## 2016-09-17 NOTE — ED Notes (Addendum)
Pt needed holding assistance for IV start. Padded side rails put on per family request.

## 2016-09-17 NOTE — ED Provider Notes (Signed)
[facility]-EMERGENCY DEPT Provider Note   CSN: [id number] Arrival date & time: 09/17/16  1402     History   Chief Complaint Chief Complaint  Patient presents with  . Altered Mental Status    HPI Monica [name] is a 66 y.o. female.  The history is provided by a relative. No language interpreter was used.  Altered Mental Status     Monica [name] is a 66 y.o. female who presents to the Emergency Department complaining of AMS.  Level V caveat due to AMS.  History is provided by the patient's family. They state that she has been not at her baseline since 6 this morning. She has a history of hepatic encephalopathy and has had similar episodes when her ammonia was high. Yesterday she was in her normal state of health. No reports of fevers, vomiting, head injury. Past Medical History:  Diagnosis Date  . Bilateral lower extremity edema   . Carotid artery bruit    Doppler 07/09/2011 - abnormal study, no change from previous  . Chest pain 09/02/2012   Normal, EF-67  . Coronary atherosclerosis of unspecified type of vessel, native or graft   . Endometrial polyp   . Esophageal varices (HCC)   . GERD (gastroesophageal reflux disease)   . Hiatal hernia   . Hyperlipidemia   . Hypertension   . Iron deficiency anemia   . Irritable bowel syndrome   . Macular degeneration (senile) of retina, unspecified   . Obesity, unspecified   . Osteoarthrosis, unspecified whether generalized or localized, unspecified site   . 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 SOB (shortness of breath)    Normal 2D echo - EF-58, 03/18/2001  . Tobacco abuse   . Type II or unspecified type diabetes mellitus without mention of complication, not stated as uncontrolled   . Unspecified sleep apnea     Patient Active Problem List   Diagnosis Date Noted  . Acute respiratory failure with hypoxia (HCC) 08/16/2016  . Pneumonia due to hemophilus influenzae  (HCC) 08/15/2016  . Acute encephalopathy 08/12/2016  . Hepatic encephalopathy (HCC) 07/25/2016  . Esophageal varices without bleeding (HCC) 03/05/2015  . Leg edema 03/05/2015  . Ascites 03/05/2015  . Special screening for malignant neoplasms, colon 03/05/2015  . Hepatic cirrhosis (HCC) 03/05/2015  . Bilateral carotid artery disease (HCC) 08/15/2014  . Bilateral lower extremity edema 08/15/2014  . Tobacco abuse 08/15/2014  . Cirrhosis of liver without mention of alcohol 03/23/2013  . Other and unspecified hyperlipidemia 02/26/2013  . Polyneuropathy in diabetes(357.2) 02/26/2013  . Hypothyroidism 02/24/2013  . COUGH 10/07/2007  . Type II or unspecified type diabetes mellitus with neurological manifestations, uncontrolled(250.62) 04/08/2007  . Obesity 04/08/2007  . Macular degeneration (senile) of retina 04/08/2007  . Coronary atherosclerosis 04/08/2007  . GERD 04/08/2007  . IRRITABLE BOWEL SYNDROME 04/08/2007  . OSTEOARTHRITIS 04/08/2007  . SLEEP APNEA 04/08/2007    Past Surgical History:  Procedure Laterality Date  . APPENDECTOMY    . CARDIAC CATHETERIZATION  03/18/2007   Normal coronary arteries and LV function  . CARDIAC CATHETERIZATION  07/01/2001   Normal cath  . CARDIAC CATHETERIZATION  05/17/1997   Normal cath  . CAROTID ENDARTERECTOMY    . CHOLECYSTECTOMY      OB History    No data available       Home Medications    Prior to Admission medications   Medication Sig Start Date End Date  Taking? Authorizing Provider  CHERRY PO Take 8 oz by mouth 2 (two) times daily.    Historical Provider, MD  clopidogrel (PLAVIX) 75 MG tablet Take 75 mg by mouth daily.      Historical Provider, MD  Dulaglutide (TRULICITY) 1.5 MG/0.5ML SOPN Inject 0.5 mLs into the skin once a week.    Historical Provider, MD  furosemide (LASIX) 40 MG tablet Take 1 tablet (40 mg total) by mouth 2 (two) times daily. 08/18/16   Clydia Llano, MD  lactulose (CHRONULAC) 10 GM/15ML solution Take 30 mLs  (20 g total) by mouth 3 (three) times daily. 08/18/16   Clydia Llano, MD  LANTUS SOLOSTAR 100 UNIT/ML Solostar Pen INJECT 20 TO 24 UNITS TWICE DAILY Patient taking differently: Inject 28 units into the skin two times a day 06/21/14   Reather Littler, MD  levothyroxine (SYNTHROID, LEVOTHROID) 75 MCG tablet Take 75 mcg by mouth daily. Brand Name Only    Historical Provider, MD  magnesium oxide (MAG-OX) 400 MG tablet Take 1 tablet (400 mg total) by mouth daily. 08/18/16   Clydia Llano, MD  MILK THISTLE PO Take 1 capsule by mouth 2 (two) times daily.    Historical Provider, MD  Multiple Vitamins-Minerals (MULTIVITAMIN WITH MINERALS) tablet Take 1 tablet by mouth daily. 08/18/16   Clydia Llano, MD  pantoprazole (PROTONIX) 40 MG tablet Take 40 mg by mouth daily.  07/18/14   Historical Provider, MD  propranolol (INDERAL) 60 MG tablet Take 60 mg by mouth daily.  02/20/15   Historical Provider, MD  rifaximin (XIFAXAN) 550 MG TABS tablet Take 1 tablet (550 mg total) by mouth 2 (two) times daily. 08/18/16   Clydia Llano, MD  simvastatin (ZOCOR) 40 MG tablet Take 40 mg by mouth every Monday, Wednesday, and Friday.     Historical Provider, MD  VENTOLIN HFA 108 (90 BASE) MCG/ACT inhaler Inhale 2 puffs into the lungs every 6 (six) hours as needed for shortness of breath.  02/08/15   Historical Provider, MD    Family History Family History  Problem Relation Age of Onset  . Breast cancer Mother   . Diabetes Father   . Breast cancer Sister   . Diabetes Sister   . Diabetes Brother   . Colon cancer Paternal Aunt   . Stomach cancer Paternal Aunt     Social History Social History  Substance Use Topics  . Smoking status: Current Every Day Smoker    Packs/day: 1.00    Types: Cigarettes  . Smokeless tobacco: Never Used     Comment: form given on 05-26-11  . Alcohol use No     Allergies   Biaxin [clarithromycin]; Gatifloxacin; Sulfamethoxazole; Sulfonamide derivatives; Actos [pioglitazone]; Augmentin [amoxicillin-pot  clavulanate]; Farxiga [dapagliflozin]; Invokana [canagliflozin]; Latex; Metformin and related; Tape; and Penicillins   Review of Systems Review of Systems  All other systems reviewed and are negative.    Physical Exam Updated Vital Signs BP (!) 106/46 (BP Location: Left Arm)   Pulse 77   Temp 97.9 F (36.6 C) (Axillary) Comment (Src): pt refused oral temp  Resp 16   SpO2 99%   Physical Exam  Constitutional: She appears well-developed and well-nourished.  HENT:  Head: Normocephalic and atraumatic.  Cardiovascular: Normal rate and regular rhythm.   No murmur heard. Pulmonary/Chest: Effort normal and breath sounds normal. No respiratory distress.  Abdominal: Soft. There is no tenderness. There is no rebound and no guarding.  Musculoskeletal: She exhibits edema. She exhibits no tenderness.  2+ pitting edema to  BLE  Neurological:  Lethargic but arousable to verbal stimuli. Weakness in all 4 extremities, greatest over the right upper and right lower extremity. Does not follow commands. Dysarthric speech. GCS 4-3-4  Skin: Skin is warm and dry.  Psychiatric: She has a normal mood and affect. Her behavior is normal.  Nursing note and vitals reviewed.    ED Treatments / Results  Labs (all labs ordered are listed, but only abnormal results are displayed) Labs Reviewed  COMPREHENSIVE METABOLIC PANEL - Abnormal; Notable for the following:       Result Value   Potassium 5.2 (*)    Glucose, Bld 120 (*)    BUN 48 (*)    Creatinine, Ser 3.64 (*)    Calcium 8.1 (*)    GFR calc non Af Amer 12 (*)    GFR calc Af Amer 14 (*)    All other components within normal limits  CBC WITH DIFFERENTIAL/PLATELET - Abnormal; Notable for the following:    RBC 3.81 (*)    Hemoglobin 10.7 (*)    HCT 31.6 (*)    RDW 17.8 (*)    Platelets 78 (*)    All other components within normal limits  PROTIME-INR - Abnormal; Notable for the following:    Prothrombin Time 17.0 (*)    All other components  within normal limits  BLOOD GAS, VENOUS - Abnormal; Notable for the following:    pCO2, Ven 41.0 (*)    All other components within normal limits  AMMONIA - Abnormal; Notable for the following:    Ammonia 82 (*)    All other components within normal limits  CBG MONITORING, ED - Abnormal; Notable for the following:    Glucose-Capillary 102 (*)    All other components within normal limits  URINE CULTURE  ETHANOL  URINALYSIS, ROUTINE W REFLEX MICROSCOPIC    EKG  EKG Interpretation None       Radiology Ct Head Wo Contrast  Result Date: 09/17/2016 CLINICAL DATA:  AMS. Hx of elevated ammonia levels. Has had AMS since 0600. Family reports her AMS is typical for when her ammonia levels become elevated. Hx of non-compliance with lactulose. Pt disoriented 4x. combative Unable to hold still EXAM: CT HEAD WITHOUT CONTRAST TECHNIQUE: Contiguous axial images were obtained from the base of the skull through the vertex without intravenous contrast. COMPARISON:  08/12/2016 FINDINGS: Brain: No evidence of acute infarction, hemorrhage, hydrocephalus, extra-axial collection or mass lesion/mass effect. Vascular: There is atherosclerotic calcification of the carotid siphonsand vertebral arteries. Skull: Normal. Negative for fracture or focal lesion. Sinuses/Orbits: No acute finding. Other: There is significant patient motion artifact. IMPRESSION: 1.  No evidence for acute  abnormality. 2. Patient motion artifact Electronically Signed   By: Norva Pavlov M.D.   On: 09/17/2016 15:55    Procedures Procedures (including critical care time)  Medications Ordered in ED Medications  lactulose (CHRONULAC) enema 200 gm (not administered)  sodium chloride 0.9 % bolus 1,000 mL (1,000 mLs Intravenous New Bag/Given 09/17/16 1630)     Initial Impression / Assessment and Plan / ED Course  I have reviewed the triage vital signs and the nursing notes.  Pertinent labs & imaging results that were available during my  care of the patient were reviewed by me and considered in my medical decision making (see chart for details).     Pt with hx/o CKD, cirrhosis here with change in mental status since 6 am.  Pt is altered, swearing in ED.  Labs demonstrate AKI  compared to priors.  Treating for hepatic encephalopathy with rectal lactulose as pt cannot follow commands for oral lactulose.  Providing IVF for AKI.  Hospitalist consulted for admission for further treatment.    Final Clinical Impressions(s) / ED Diagnoses   Final diagnoses:  None    New Prescriptions New Prescriptions   No medications on file     Tilden Fossa, MD 09/17/16 1712

## 2016-09-18 ENCOUNTER — Inpatient Hospital Stay (HOSPITAL_COMMUNITY): Payer: Medicare Other

## 2016-09-18 ENCOUNTER — Ambulatory Visit: Payer: Medicare Other | Admitting: Gastroenterology

## 2016-09-18 DIAGNOSIS — I8510 Secondary esophageal varices without bleeding: Secondary | ICD-10-CM

## 2016-09-18 DIAGNOSIS — D649 Anemia, unspecified: Secondary | ICD-10-CM

## 2016-09-18 DIAGNOSIS — E1122 Type 2 diabetes mellitus with diabetic chronic kidney disease: Secondary | ICD-10-CM

## 2016-09-18 DIAGNOSIS — E039 Hypothyroidism, unspecified: Secondary | ICD-10-CM

## 2016-09-18 DIAGNOSIS — N184 Chronic kidney disease, stage 4 (severe): Secondary | ICD-10-CM

## 2016-09-18 DIAGNOSIS — D696 Thrombocytopenia, unspecified: Secondary | ICD-10-CM

## 2016-09-18 DIAGNOSIS — Z515 Encounter for palliative care: Secondary | ICD-10-CM

## 2016-09-18 DIAGNOSIS — Z794 Long term (current) use of insulin: Secondary | ICD-10-CM

## 2016-09-18 LAB — COMPREHENSIVE METABOLIC PANEL
ALT: 12 U/L — ABNORMAL LOW (ref 14–54)
ANION GAP: 9 (ref 5–15)
AST: 21 U/L (ref 15–41)
Albumin: 2.9 g/dL — ABNORMAL LOW (ref 3.5–5.0)
Alkaline Phosphatase: 82 U/L (ref 38–126)
BUN: 44 mg/dL — ABNORMAL HIGH (ref 6–20)
CHLORIDE: 111 mmol/L (ref 101–111)
CO2: 24 mmol/L (ref 22–32)
CREATININE: 3.35 mg/dL — AB (ref 0.44–1.00)
Calcium: 7.5 mg/dL — ABNORMAL LOW (ref 8.9–10.3)
GFR calc Af Amer: 15 mL/min — ABNORMAL LOW (ref >60)
GFR, EST NON AFRICAN AMERICAN: 13 mL/min — AB (ref >60)
Glucose, Bld: 79 mg/dL (ref 65–99)
POTASSIUM: 3.9 mmol/L (ref 3.5–5.1)
Sodium: 144 mmol/L (ref 135–145)
Total Bilirubin: 1.2 mg/dL (ref 0.3–1.2)
Total Protein: 6.1 g/dL — ABNORMAL LOW (ref 6.5–8.1)

## 2016-09-18 LAB — URINALYSIS, ROUTINE W REFLEX MICROSCOPIC
BILIRUBIN URINE: NEGATIVE
GLUCOSE, UA: NEGATIVE mg/dL
KETONES UR: NEGATIVE mg/dL
Leukocytes, UA: NEGATIVE
NITRITE: NEGATIVE
PROTEIN: NEGATIVE mg/dL
Specific Gravity, Urine: 1.006 (ref 1.005–1.030)
Squamous Epithelial / LPF: NONE SEEN
pH: 5 (ref 5.0–8.0)

## 2016-09-18 LAB — MRSA PCR SCREENING: MRSA by PCR: NEGATIVE

## 2016-09-18 LAB — GLUCOSE, CAPILLARY
GLUCOSE-CAPILLARY: 170 mg/dL — AB (ref 65–99)
Glucose-Capillary: 139 mg/dL — ABNORMAL HIGH (ref 65–99)
Glucose-Capillary: 173 mg/dL — ABNORMAL HIGH (ref 65–99)
Glucose-Capillary: 155 mg/dL — ABNORMAL HIGH (ref 65–99)

## 2016-09-18 LAB — SODIUM, URINE, RANDOM: Sodium, Ur: 128 mmol/L

## 2016-09-18 LAB — CBC
HCT: 26.9 % — ABNORMAL LOW (ref 36.0–46.0)
Hemoglobin: 8.8 g/dL — ABNORMAL LOW (ref 12.0–15.0)
MCH: 27.3 pg (ref 26.0–34.0)
MCHC: 32.7 g/dL (ref 30.0–36.0)
MCV: 83.5 fL (ref 78.0–100.0)
PLATELETS: 74 10*3/uL — AB (ref 150–400)
RBC: 3.22 MIL/uL — AB (ref 3.87–5.11)
RDW: 17.8 % — ABNORMAL HIGH (ref 11.5–15.5)
WBC: 5.6 10*3/uL (ref 4.0–10.5)

## 2016-09-18 LAB — CREATININE, URINE, RANDOM: Creatinine, Urine: 35.99 mg/dL

## 2016-09-18 LAB — TSH: TSH: 1.611 u[IU]/mL (ref 0.350–4.500)

## 2016-09-18 MED ORDER — INSULIN ASPART 100 UNIT/ML ~~LOC~~ SOLN
0.0000 [IU] | Freq: Three times a day (TID) | SUBCUTANEOUS | Status: DC
  Administered 2016-09-18: 1 [IU] via SUBCUTANEOUS
  Administered 2016-09-18 – 2016-09-19 (×2): 2 [IU] via SUBCUTANEOUS
  Administered 2016-09-19: 1 [IU] via SUBCUTANEOUS

## 2016-09-18 MED ORDER — PROPRANOLOL HCL 20 MG PO TABS
60.0000 mg | ORAL_TABLET | Freq: Every day | ORAL | Status: DC
Start: 2016-09-18 — End: 2016-09-19
  Administered 2016-09-19: 60 mg via ORAL
  Filled 2016-09-18: qty 3
  Filled 2016-09-18 (×2): qty 1

## 2016-09-18 MED ORDER — INSULIN ASPART 100 UNIT/ML ~~LOC~~ SOLN: 0.0000 [IU] | Freq: Every day | SUBCUTANEOUS | Status: DC

## 2016-09-18 MED ORDER — HYDROCODONE-ACETAMINOPHEN 5-325 MG PO TABS: 1.0000 | ORAL_TABLET | Freq: Four times a day (QID) | ORAL | Status: DC | PRN

## 2016-09-18 MED ORDER — RIFAXIMIN 550 MG PO TABS
550.0000 mg | ORAL_TABLET | Freq: Two times a day (BID) | ORAL | Status: DC
  Administered 2016-09-18 – 2016-09-19 (×3): 550 mg via ORAL
  Filled 2016-09-18 (×4): qty 1

## 2016-09-18 MED ORDER — ACETAMINOPHEN 325 MG PO TABS
650.0000 mg | ORAL_TABLET | ORAL | Status: DC | PRN
  Filled 2016-09-18: qty 2

## 2016-09-18 MED ORDER — SODIUM CHLORIDE 0.45 % IV SOLN
INTRAVENOUS | Status: DC
  Administered 2016-09-18: 12:00:00 via INTRAVENOUS
  Administered 2016-09-19: 1000 mL via INTRAVENOUS

## 2016-09-18 MED ORDER — PREMIER PROTEIN SHAKE
11.0000 [oz_av] | Freq: Two times a day (BID) | ORAL | Status: DC
  Administered 2016-09-18 – 2016-09-19 (×3): 11 [oz_av] via ORAL
  Filled 2016-09-18 (×3): qty 325.31

## 2016-09-18 MED ORDER — PANTOPRAZOLE SODIUM 40 MG PO TBEC
40.0000 mg | DELAYED_RELEASE_TABLET | Freq: Every day | ORAL | Status: DC
  Administered 2016-09-18 – 2016-09-19 (×2): 40 mg via ORAL
  Filled 2016-09-18 (×2): qty 1

## 2016-09-18 MED ORDER — LEVOTHYROXINE SODIUM 25 MCG PO TABS
75.0000 ug | ORAL_TABLET | Freq: Every day | ORAL | Status: DC
  Administered 2016-09-18 – 2016-09-19 (×2): 75 ug via ORAL
  Filled 2016-09-18 (×2): qty 1

## 2016-09-18 MED ORDER — INSULIN GLARGINE 100 UNIT/ML ~~LOC~~ SOLN
5.0000 [IU] | Freq: Every day | SUBCUTANEOUS | Status: DC
  Filled 2016-09-18: qty 0.05

## 2016-09-18 MED ORDER — LACTULOSE 10 GM/15ML PO SOLN
20.0000 g | Freq: Three times a day (TID) | ORAL | Status: DC
  Administered 2016-09-18 – 2016-09-19 (×4): 20 g via ORAL
  Filled 2016-09-18 (×5): qty 30

## 2016-09-18 MED ORDER — SIMVASTATIN 40 MG PO TABS
40.0000 mg | ORAL_TABLET | ORAL | Status: DC
  Administered 2016-09-19: 40 mg via ORAL
  Filled 2016-09-18: qty 1

## 2016-09-18 MED ORDER — CLOPIDOGREL BISULFATE 75 MG PO TABS: 75.0000 mg | ORAL_TABLET | Freq: Every day | ORAL | Status: DC

## 2016-09-18 MED ORDER — INSULIN GLARGINE 100 UNIT/ML ~~LOC~~ SOLN
10.0000 [IU] | Freq: Every day | SUBCUTANEOUS | Status: DC
  Administered 2016-09-18 – 2016-09-19 (×2): 10 [IU] via SUBCUTANEOUS
  Filled 2016-09-18 (×2): qty 0.1

## 2016-09-18 NOTE — Progress Notes (Signed)
PROGRESS NOTE    DIONICIA [name]  NGE:[id number] DOB: [birth year] DOA: 09/17/2016 PCP: Galvin Proffer, MD   Brief Narrative: Monica [name] is a 66 y.o. female with medical history significant of CAD, PVD, sleep apnea, diabetes mellitus, GERD, arthritis, NASH with cirrhosis. She presented with altered mental status secondary to hepatic encephalopathy. She was given a lactulose enema and has now returned to baseline.     Assessment & Plan:   Active Problems:   Type 2 diabetes mellitus with stage 4 chronic kidney disease (HCC)   GERD   Hypothyroidism   Esophageal varices without bleeding (HCC)   Hepatic encephalopathy (HCC)   Thrombocytopenia (HCC)   Normocytic anemia   Acute hepatic encephalopathy Likely secondary to prescribed change in lactulose therapy (by PCP, not cardiologist as stated in the H&P). 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 INR 1.37. Thrombocytopenia. -continue betablocker -palliative care  Essential hypertension -hold oral antihypertensives -metoprolol IV 2.5mg  q6 hours  Diabetes mellitus, insulin dependent -Lantus 10 units since patient is eating, titrate for fasting blood sugar -SSI  Acute kidney injury on CKD 4 Baseline of 2.2. Presented with a  Creatinine of 3.64 on admission with minimal improvement. -nephrology consult  Chronic diastolic heart failure Last EF of 60-65% with no regional wall motion abnormalities and grade 1 diastolic dysfunction seen on echo from 10/27/2014. Stable. -hold diuretic in setting of AKI  Thrombocytopenia Chronic and stable. No evidence of bleeding.  Anemia of chronic disease Normocytic. Hemoglobin stable  Hyperkalemia Resolved.  Hypothyroidism -continue Synthroid   DVT prophylaxis: SCDs secondary to thrombocytopenia Code Status: Full code Family Communication: Husband at  bedside Disposition Plan: Discharge home in 24-48 hours   Consultants:   Palliative care medicine (5/2)  Nephrology (5/3)  Procedures:   None  Antimicrobials:  None    Subjective: Patient reports no nausea, vomiting or abdominal pain. She had about 2L of stool (per nurse) and 1.6L of urine out in the last 12 hours.  Objective: Vitals:   09/17/16 2327 09/18/16 0000 09/18/16 0315 09/18/16 0400  BP:  (!) 135/43  (!) 117/34  Pulse:  82  80  Resp:  (!) 25  14  Temp: 97.8 F (36.6 C)  98.5 F (36.9 C)   TempSrc: Oral  Oral   SpO2:  99%  99%  Weight:      Height:        Intake/Output Summary (Last 24 hours) at 09/18/16 0757 Last data filed at 09/18/16 4010  Gross per 24 hour  Intake            537.5 ml  Output             2375 ml  Net          -1837.5 ml   Filed Weights   09/17/16 1912  Weight: 87.2 kg (192 lb 3.9 oz)    Examination:  General exam: Appears calm and comfortable  Respiratory system: Clear to auscultation. Respiratory effort normal. Cardiovascular system: S1 & S2 heard, RRR. No murmurs. Gastrointestinal system: Abdomen is nondistended, soft and nontender. No organomegaly or masses felt. Normal bowel sounds heard. Central nervous system: Alert and oriented. No focal neurological deficits. Asterixis Extremities: No edema. No calf tenderness Skin: No cyanosis. No rashes Psychiatry: Judgement and insight appear normal. Mood & affect appropriate.     Data Reviewed: I have personally reviewed following labs and imaging studies  CBC:  Recent Labs Lab 09/17/16 1502 09/18/16 0353  WBC 6.5 5.6  NEUTROABS 4.9  --   HGB 10.7* 8.8*  HCT 31.6* 26.9*  MCV 82.9 83.5  PLT 78* 74*   Basic Metabolic Panel:  Recent Labs Lab 09/17/16 1502 09/18/16 0353  NA 141 144  K 5.2* 3.9  CL 106 111  CO2 23 24  GLUCOSE 120* 79  BUN 48* 44*  CREATININE 3.64* 3.35*  CALCIUM 8.1* 7.5*   GFR: Estimated Creatinine Clearance: 18.4 mL/min (A) (by C-G  formula based on SCr of 3.35 mg/dL (H)). Liver Function Tests:  Recent Labs Lab 09/17/16 1502 09/18/16 0353  AST 31 21  ALT 14 12*  ALKPHOS 97 82  BILITOT 1.2 1.2  PROT 7.2 6.1*  ALBUMIN 3.6 2.9*   No results for input(s): LIPASE, AMYLASE in the last 168 hours.  Recent Labs Lab 09/17/16 1619  AMMONIA 82*   Coagulation Profile:  Recent Labs Lab 09/17/16 1502  INR 1.37   Cardiac Enzymes: No results for input(s): CKTOTAL, CKMB, CKMBINDEX, TROPONINI in the last 168 hours. BNP (last 3 results) No results for input(s): PROBNP in the last 8760 hours. HbA1C: No results for input(s): HGBA1C in the last 72 hours. CBG:  Recent Labs Lab 09/17/16 1515  GLUCAP 102*   Lipid Profile: No results for input(s): CHOL, HDL, LDLCALC, TRIG, CHOLHDL, LDLDIRECT in the last 72 hours. Thyroid Function Tests:  Recent Labs  09/18/16 0353  TSH 1.611   Anemia Panel: No results for input(s): VITAMINB12, FOLATE, FERRITIN, TIBC, IRON, RETICCTPCT in the last 72 hours. Sepsis Labs: No results for input(s): PROCALCITON, LATICACIDVEN in the last 168 hours.  Recent Results (from the past 240 hour(s))  MRSA PCR Screening     Status: None   Collection Time: 09/17/16  9:51 PM  Result Value Ref Range Status   MRSA by PCR NEGATIVE NEGATIVE Final    Comment:        The GeneXpert MRSA Assay (FDA approved for NASAL specimens only), is one component of a comprehensive MRSA colonization surveillance program. It is not intended to diagnose MRSA infection nor to guide or monitor treatment for MRSA infections.          Radiology Studies: Ct Head Wo Contrast  Result Date: 09/17/2016 CLINICAL DATA:  AMS. Hx of elevated ammonia levels. Has had AMS since 0600. Family reports her AMS is typical for when her ammonia levels become elevated. Hx of non-compliance with lactulose. Pt disoriented 4x. combative Unable to hold still EXAM: CT HEAD WITHOUT CONTRAST TECHNIQUE: Contiguous axial images were  obtained from the base of the skull through the vertex without intravenous contrast. COMPARISON:  08/12/2016 FINDINGS: Brain: No evidence of acute infarction, hemorrhage, hydrocephalus, extra-axial collection or mass lesion/mass effect. Vascular: There is atherosclerotic calcification of the carotid siphonsand vertebral arteries. Skull: Normal. Negative for fracture or focal lesion. Sinuses/Orbits: No acute finding. Other: There is significant patient motion artifact. IMPRESSION: 1.  No evidence for acute  abnormality. 2. Patient motion artifact Electronically Signed   By: Norva Pavlov M.D.   On: 09/17/2016 15:55   Dg Chest Port 1 View  Result Date: 09/17/2016 CLINICAL DATA:  Altered mental status. History of hepatic encephalopathy. EXAM: PORTABLE CHEST 1 VIEW COMPARISON:  Chest radiograph August 15, 2016 FINDINGS: Cardiac silhouette is upper limits of normal in size, mediastinal silhouette is nonsuspicious. Pulmonary vascular congestion and diffuse mild interstitial prominence without pleural effusion or focal consolidation. No pneumothorax. Surgical clips LEFT neck, osteopenia. IMPRESSION: Borderline cardiomegaly.  Interstitial edema without focal consolidation. Electronically Signed   By: Awilda Metroourtnay  Bloomer M.D.   On: 09/17/2016 18:29        Scheduled Meds: . clopidogrel  75 mg Oral Daily  . insulin aspart  0-5 Units Subcutaneous QHS  . insulin aspart  0-9 Units Subcutaneous TID WC  . insulin glargine  10 Units Subcutaneous Daily  . lactulose  20 g Oral TID  . levothyroxine  40 mcg Intravenous Daily  . metoprolol  2.5 mg Intravenous Q6H  . pantoprazole  40 mg Oral Daily  . rifaximin  550 mg Oral BID  . [START ON 09/19/2016] simvastatin  40 mg Oral Q M,W,F  . sodium chloride flush  3 mL Intravenous Q12H   Continuous Infusions:   LOS: 1 day     Jacquelin Hawkingalph Lamonica Trueba, MD [facility] 09/18/2016, 7:57 AM Pager: [phone]  If 7PM-7AM, please contact  night-coverage [email or web address] Password [email or web address] 09/18/2016, 7:57 AM

## 2016-09-18 NOTE — Progress Notes (Signed)
Initial Nutrition Assessment  DOCUMENTATION CODES:   Obesity unspecified  INTERVENTION:  - Will order Premier Protein BID, each supplement provides 160 kcal and 30 grams of protein.  - Continue to encourage PO intakes of meals and supplements. - RD will continue to monitor for additional nutrition-related needs.  NUTRITION DIAGNOSIS:   Altered nutrition lab value related to acute illness (AKI) as evidenced by other (see comment) (elevated BUN and creatinine and low GFR).  GOAL:   Patient will meet greater than or equal to 90% of their needs  MONITOR:   PO intake, Weight trends, Labs, I & O's  REASON FOR ASSESSMENT:   Malnutrition Screening Tool  ASSESSMENT:   66 y.o. female with medical history significant of CAD, PVD, sleep apnea, diabetes mellitus, GERD, arthritis, NASH. Patient is unable to provide a history secondary to significant confusion. Per family, patient woke up today not acting herself. She spit up her medication this morning (no emesis). Yesterday, she was at her baseline. Recently, she had started taking her lactulose every other day per her cardiologist's recommendations. She was recently admitted for a similar presentation and intubated. She was discharged to SNF per patient's family and resided there for two weeks before returning home. She did very well, so her current presentation is unexpected for them.  Pt seen for MST. BMI indicates obesity. No intakes documented since admission; pt reports that she ate well for breakfast and lunch tray was delivered during time of RD visit. Visit and discussion with pt was fairly brief d/t her need to be cleaned up by RNs. Pt reports that her weight has been trending down and that this has been unintentional but she then states that it is d/t her diet and change in food choices over the past 1-2 months. She eats a low carb diet and highly limits Na intake. Pt reports that she likes to fill up on vegetables and that if she eats  bread it is only at one meal/day. Noted that pt does not have any teeth but unable to ask her about this and if she has any dentures. Pt reports that MD informed her of likely d/c tomorrow.   Physical assessment to upper body only shows no muscle and no fat wasting. Per review, pt has lost 38 lbs (16.5% body weight) in the past 1 month which is significant for time frame. Based on the way weight is recorded from 08/18/16, though, it appears that it may have been a stated weight rather than obtained on a scale; will monitor weight trends closely. Unable to state malnutrition at this time based on available information.   Medications reviewed; sliding scale Novolog, 10 units Lantus/day, 20 g lactulose TID, 75 mcg oral Synthroid/day, 40 mg oral Protonix/day. Labs reviewed; BUN: 44 mg/dL, creatinine: 4.09 mg/dL, Ca: 7.5 mg/dL, GFR: 13 mL/min.   IVF: 1/2 NS @ 75 mL/hr.    Diet Order:  Diet renal/carb modified with fluid restriction Diet-HS Snack? Nothing; Room service appropriate? Yes; Fluid consistency: Thin; Fluid restriction: 1500 mL Fluid  Skin:  Reviewed, no issues  Last BM:  5/3  Height:   Ht Readings from Last 1 Encounters:  09/17/16 5\' 6"  (1.676 m)    Weight:   Wt Readings from Last 1 Encounters:  09/17/16 192 lb 3.9 oz (87.2 kg)    Ideal Body Weight:  59.09 kg  BMI:  Body mass index is 31.03 kg/m.  Estimated Nutritional Needs:   Kcal:  1745-2005 (20-23 kcal/kg)  Protein:  87-105  grams (1-1.2 grams/kg)  Fluid:  1.5-1.7 L/day  EDUCATION NEEDS:   No education needs identified at this time    Trenton GammonJessica Lucion Dilger, MS, RD, LDN, [facility] Inpatient Clinical Dietitian Pager # [phone] After hours/weekend pager # [phone]

## 2016-09-18 NOTE — Consult Note (Signed)
Renal Service Consult Note [place] 09/18/2016 Monica [name] D Requesting Physician:  Dr. Lonny Prude    Reason for Consult:  Acute on CKD HPI: The patient is a 66 y.o. year-old presenting from home with AMS. In ED yest was combative and disoriented.  Hx similar issues on recent admits for hepatic encephalopathy.  1 day prior she had normal MS.  Lactulose had been recently dec'd to "every other day" according to notes.  NH3 82. Creat up at 3.5, usually 2.0 range.  Asked to see for renal failure.   Pt is alert today, back to normal MS. Denies any SOB, cough CP, no difficulty voiding (has foley in now).  No n/v. Has diarrhea w rectal tube in place.   BP's here have been high-normal on admission, gradually decreasing to normal and low-normal today.  Inpt meds > zocor, xifaxan, inderal, PPI, synthroid, lactulose, lantus, novolog and plavix.   CXR read as IS edema, however old films (portables) all have similar appearance, except the PA/ Lat does not.   Na 144 K 3.9 CO2 24  BUN 44  Cr 3.35  Ca 7.5  Alb 2.9/ tprot 6.1  LFT's ok  tbili 1.2 eGFR 13  WBC 5k  Hb 8.8  plt 74  UOP 1675 last 24 hrs.   UA > not done Last UA 3/27 > negative   Date  Creat  eGFR 2009  0.8 2010- 16 1.2- 1.4 Mar 18  2.0 -2.3 Apr 18  2.0- 2.3 21- 23 May 2, 18 3.64  12 May 3, 18 3.35  13    Old chart: Mar 18 - severe hepatic enceph, rx Engineer, materials. CT / MRI neg. AKI resolved . NASH w cirroshis / esoph varisces.  BB.  HL, HTN, GERD. PAD, DM2.  Obesity, mac degeneration. Ascites Apr 18 - acute resp failure/ hypoxemia, due to dec'd MS/ hepatic enceph/ PNA.  NH3 171 on admission, rx abx/ lact, MS better, extubated. Fluid overload/ LE edema rx with IV lasix / metolazone, dc lasix/ aldact po.  CKD stage baseline creat 1.9 at dc. DM2.    ROS  denies CP  no joint pain   no HA  no blurry vision  no rash  no diarrhea  no nausea/ vomiting  no dysuria  no difficulty voiding  no  change in urine color    Past Medical History  Past Medical History:  Diagnosis Date  . Bilateral lower extremity edema   . Carotid artery bruit    Doppler 07/09/2011 - abnormal study, no change from previous  . Chest pain 09/02/2012   Normal, EF-67  . Coronary atherosclerosis of unspecified type of vessel, native or graft   . Endometrial polyp   . Esophageal varices ([place])   . GERD (gastroesophageal reflux disease)   . Hiatal hernia   . Hyperlipidemia   . Hypertension   . Iron deficiency anemia   . Irritable bowel syndrome   . Macular degeneration (senile) of retina, unspecified   . Obesity, unspecified   . Osteoarthrosis, unspecified whether generalized or localized, unspecified site   . 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 SOB (shortness of breath)    Normal 2D echo - EF-58, 03/18/2001  . Tobacco abuse   . Type II or unspecified type diabetes mellitus without mention of complication, not stated as uncontrolled   . Unspecified sleep apnea    Past Surgical  History  Past Surgical History:  Procedure Laterality Date  . APPENDECTOMY    . CARDIAC CATHETERIZATION  03/18/2007   Normal coronary arteries and LV function  . CARDIAC CATHETERIZATION  07/01/2001   Normal cath  . CARDIAC CATHETERIZATION  05/17/1997   Normal cath  . CAROTID ENDARTERECTOMY    . CHOLECYSTECTOMY     Family History  Family History  Problem Relation Age of Onset  . Breast cancer Mother   . Diabetes Father   . Breast cancer Sister   . Diabetes Sister   . Diabetes Brother   . Colon cancer Paternal Aunt   . Stomach cancer Paternal Aunt    Social History  reports that she has been smoking Cigarettes.  She has been smoking about 1.00 pack per day. She has never used smokeless tobacco. She reports that she does not drink alcohol or use drugs. Allergies  Allergies  Allergen Reactions  . Biaxin [Clarithromycin] Shortness Of Breath   . Gatifloxacin Shortness Of Breath  . Sulfamethoxazole Shortness Of Breath  . Sulfonamide Derivatives Shortness Of Breath  . Actos [Pioglitazone]     Reaction noted by PCP's office  . Augmentin [Amoxicillin-Pot Clavulanate]     Reaction not known by husband  . Farxiga [Dapagliflozin]     Reaction unknown by husband  . Invokana [Canagliflozin]     Reaction unknown by husband  . Latex Other (See Comments)    Reaction unknown by husband  . Metformin And Related     Reaction noted by PCP's office  . Tape Hives  . Penicillins Hives and Rash    Has patient had a PCN reaction causing immediate rash, facial/tongue/throat swelling, SOB or lightheadedness with hypotension: Yes Has patient had a PCN reaction causing severe rash involving mucus membranes or skin necrosis: No Has patient had a PCN reaction that required hospitalization: No Has patient had a PCN reaction occurring within the last 10 years: No If all of the above answers are "NO", then may proceed with Cephalosporin use.   Home medications Prior to Admission medications   Medication Sig Start Date End Date Taking? Authorizing Provider  clopidogrel (PLAVIX) 75 MG tablet Take 75 mg by mouth daily.     Yes Historical Provider, MD  Dulaglutide (TRULICITY) 1.5 NL/9.7QB SOPN Inject 0.5 mLs into the skin once a week.   Yes Historical Provider, MD  lactulose (CHRONULAC) 10 GM/15ML solution Take 30 mLs (20 g total) by mouth 3 (three) times daily. 08/18/16  Yes Verlee Monte, MD  LANTUS SOLOSTAR 100 UNIT/ML Solostar Pen INJECT 20 TO 24 UNITS TWICE DAILY Patient taking differently: Inject 20 units into the skin two times a day 06/21/14  Yes Elayne Snare, MD  levothyroxine (SYNTHROID, LEVOTHROID) 75 MCG tablet Take 75 mcg by mouth daily. Brand Name Only   Yes Historical Provider, MD  magnesium oxide (MAG-OX) 400 MG tablet Take 1 tablet (400 mg total) by mouth daily. 08/18/16  Yes Verlee Monte, MD  MILK THISTLE PO Take 1 capsule by mouth 2 (two) times  daily.   Yes Historical Provider, MD  Multiple Vitamins-Minerals (MULTIVITAMIN WITH MINERALS) tablet Take 1 tablet by mouth daily. 08/18/16  Yes Verlee Monte, MD  pantoprazole (PROTONIX) 40 MG tablet Take 40 mg by mouth daily.  07/18/14  Yes Historical Provider, MD  propranolol (INDERAL) 60 MG tablet Take 60 mg by mouth daily.  02/20/15  Yes Historical Provider, MD  rifaximin (XIFAXAN) 550 MG TABS tablet Take 1 tablet (550 mg total) by mouth 2 (  two) times daily. 08/18/16  Yes Verlee Monte, MD  simvastatin (ZOCOR) 40 MG tablet Take 40 mg by mouth every Monday, Wednesday, and Friday.    Yes Historical Provider, MD  spironolactone (ALDACTONE) 100 MG tablet Take 100 mg by mouth daily.   Yes Historical Provider, MD  torsemide (DEMADEX) 20 MG tablet Take 40 mg by mouth 2 (two) times daily.  09/09/16  Yes Historical Provider, MD  VENTOLIN HFA 108 (90 BASE) MCG/ACT inhaler Inhale 2 puffs into the lungs every 6 (six) hours as needed for shortness of breath.  02/08/15  Yes Historical Provider, MD   Liver Function Tests  Recent Labs Lab 09/17/16 1502 09/18/16 0353  AST 31 21  ALT 14 12*  ALKPHOS 97 82  BILITOT 1.2 1.2  PROT 7.2 6.1*  ALBUMIN 3.6 2.9*   No results for input(s): LIPASE, AMYLASE in the last 168 hours. CBC  Recent Labs Lab 09/17/16 1502 09/18/16 0353  WBC 6.5 5.6  NEUTROABS 4.9  --   HGB 10.7* 8.8*  HCT 31.6* 26.9*  MCV 82.9 83.5  PLT 78* 74*   Basic Metabolic Panel  Recent Labs Lab 09/17/16 1502 09/18/16 0353  NA 141 144  K 5.2* 3.9  CL 106 111  CO2 23 24  GLUCOSE 120* 79  BUN 48* 44*  CREATININE 3.64* 3.35*  CALCIUM 8.1* 7.5*   Iron/TIBC/Ferritin/ %Sat    Component Value Date/Time   IRON 22 (L) 03/18/2011 1538   IRON 22 (L) 03/18/2011 1538   FERRITIN 140.8 03/18/2011 1538   IRONPCTSAT 7.7 (L) 03/18/2011 1538    Vitals:   09/18/16 0500 09/18/16 0600 09/18/16 0700 09/18/16 0800  BP: (!) 103/28 (!) 108/34 120/72 (!) 109/55  Pulse: 76 73 73 73  Resp: _0 (!) 23  Temp:      TempSrc:      SpO2: 96% 97% 97% 98%  Weight:      Height:       Exam Gen alert, no distress No rash, cyanosis or gangrene Sclera anicteric, throat clear  No jvd or bruits Chest clear bilat RRR no MRG Abd soft ntnd no mass or ascites +bs GU foley in place MS no joint effusions or deformity Ext no LE edema / no wounds or ulcers Neuro is alert, Ox 3 , nf  CXR read as IS edema, however old films (portables) all have similar appearance, except the PA/ Lat does not.   Na 144 K 3.9 CO2 24  BUN 44  Cr 3.35  Ca 7.5  Alb 2.9/ tprot 6.1  LFT's ok  tbili 1.2 eGFR 13  WBC 5k  Hb 8.8  plt 74  UOP 1675 last 24 hrs.   UA > not done Last UA 3/27 > negative   Date  Creat  eGFR 2009  0.8 2010- 16 1.2- 1.4 Mar 18  2.0 -2.3 Apr 18  2.0- 2.3 21- 23 May 2, 18 3.64  12 May 3, 18 3.35  13    Assessment: 1. Acute on CKD4 - baseline eGFR only 22 ml/ min.  Doubt CHF , has room for volume on exam.  Will resume IVF's, check UA, urien lytes, renal US.  Poor dialysis candidate given significant comorbidities (advanced liver disease), have d/w pt/ husband and answered questions.  2. Cirrhosis/ NASH 3. AMS d/t hepatic enceph - better 4. DM2 5. HTN - bp's soft today 6. Chronic thrombocytopenia 7. Anemia of chron disease    Plan - as above  Rob  [facility] pager [phone]   09/18/2016, 9:10 AM

## 2016-09-18 NOTE — Care Management Note (Signed)
Case Management Note  Patient Details  Name: Monica [name] MRN: [id number] Date of Birth: 05/11/1951  Subjective/Objective:       Acute pancreatitis and ams             Action/Plan:  From home [id number]/Monica [name], BSN, RN3, CCM/[phone] Chart review for patient progression. Chart review for case management needs: Next review due on [id number].  Expected Discharge Date:   (unknown)               Expected Discharge Plan:  Home/Self Care  In-House Referral:     Discharge planning Services  CM Consult  Post Acute Care Choice:    Choice offered to:     DME Arranged:    DME Agency:     HH Arranged:    HH Agency:     Status of Service:  In process, will continue to follow  If discussed at Long Length of Stay Meetings, dates discussed:    Additional Comments:  Monica [name], Monica Lynn, RN 09/18/2016, 9:10 AM

## 2016-09-18 NOTE — Consult Note (Signed)
Consultation Note Date: 09/18/2016   Patient Name: Monica [name]  DOB: 1950/12/13  MRN: [id number]  Age / Sex: 66 y.o., female  PCP: Raelyn Number, MD Referring Physician: Mariel Aloe, MD  Reason for Consultation: Establishing goals of care  HPI/Patient Profile: 66 y.o. female  with past medical history of CAD, DM, PVD, sleep apnea, GERD, NASH cirrhosis admitted on 09/17/2016 with altered mental status secondary to hepatic encephalopathy.   Clinical Assessment and Goals of Care:  Ms Lawlor is a 66 year old lady who lives in [place] with her husband and her family. She has NASH cirrhosis, has required 3 admissions recently, has been admitted with confusion, was given lactulose enema and is now returned to baseline. Also has worsening chronic kidney disease. A palliative consult has been placed for goals of care discussions.   The patient is resting in bed, she has just met with Dr Jonnie Finner from the renal service, husband is present at the bedside. I introduced myself and palliative care as follows: Palliative medicine is specialized medical care for people living with serious illness. It focuses on providing relief from the symptoms and stress of a serious illness. The goal is to improve quality of life for both the patient and the family.  Patient and husband state that they appreciate the honest and compassionately delivered information from both renal and hospital medicine services. We discussed in detail about sequelae of liver disease, hepato renal syndrome, end stage liver disease consequences etc. Goals and wishes discussed, see recommendations below, thank you for the consult.   NEXT OF KIN  husband of 50+ years, they have known each other for a long time. Have 3 kids who live locally, have few grand children and several great grand children as well.   SUMMARY OF RECOMMENDATIONS    1. We have discussed  DNR DNI in detail, while patient fully understands the serious irreversible nature of her illness, wishes to think about it some more, will discuss with her family too, remains full code for now.  2. Patient wishes to regularly see Dr Henrene Pastor from GI, continue with Lactulose and monitor her medications closely. She wishes to live as long as she can, has several great grand children on the way and is looking forward to their birth later this year.  3. Patient is appreciative of the information she has received from Dr Jonnie Finner, understands that dialysis is not an option for her, we discussed about sequelae of chronic liver disease in detail. All questions answered to the best of my ability.   Code Status/Advance Care Planning:  Full code    Symptom Management:    continue current medications.   Palliative Prophylaxis:   Bowel Regimen  Psycho-social/Spiritual:   Desire for further Chaplaincy support:no  Additional Recommendations: Caregiving  Support/Resources  Prognosis:   < 12 months  Discharge Planning: Home with Home Health      Primary Diagnoses: Present on Admission: . Hepatic encephalopathy ([place]) . Esophageal varices without bleeding ([place]) . GERD . Hypothyroidism .  Type 2 diabetes mellitus with stage 4 chronic kidney disease ([place])   I have reviewed the medical record, interviewed the patient and family, and examined the patient. The following aspects are pertinent.  Past Medical History:  Diagnosis Date  . Bilateral lower extremity edema   . Carotid artery bruit    Doppler 07/09/2011 - abnormal study, no change from previous  . Chest pain 09/02/2012   Normal, EF-67  . Coronary atherosclerosis of unspecified type of vessel, native or graft   . Endometrial polyp   . Esophageal varices ([place])   . GERD (gastroesophageal reflux disease)   . Hiatal hernia   . Hyperlipidemia   . Hypertension   . Iron deficiency anemia   . Irritable bowel syndrome   . Macular  degeneration (senile) of retina, unspecified   . Obesity, unspecified   . Osteoarthrosis, unspecified whether generalized or localized, unspecified site   . 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 SOB (shortness of breath)    Normal 2D echo - EF-58, 03/18/2001  . Tobacco abuse   . Type II or unspecified type diabetes mellitus without mention of complication, not stated as uncontrolled   . Unspecified sleep apnea    Social History   Social History  . Marital status: Married    Spouse name: N/A  . Number of children: 2  . Years of education: N/A   Occupational History  . Retired    Social History Main Topics  . Smoking status: Current Every Day Smoker    Packs/day: 1.00    Types: Cigarettes  . Smokeless tobacco: Never Used     Comment: form given on 05-26-11  . Alcohol use No  . Drug use: No  . Sexual activity: Not Asked   Other Topics Concern  . None   Social History Narrative  . None   Family History  Problem Relation Age of Onset  . Breast cancer Mother   . Diabetes Father   . Breast cancer Sister   . Diabetes Sister   . Diabetes Brother   . Colon cancer Paternal Aunt   . Stomach cancer Paternal Aunt    Scheduled Meds: . insulin aspart  0-5 Units Subcutaneous QHS  . insulin aspart  0-9 Units Subcutaneous TID WC  . insulin glargine  10 Units Subcutaneous Daily  . lactulose  20 g Oral TID  . levothyroxine  75 mcg Oral QAC breakfast  . pantoprazole  40 mg Oral Daily  . propranolol  60 mg Oral Daily  . protein supplement shake  11 oz Oral BID BM  . rifaximin  550 mg Oral BID  . [START ON 09/19/2016] simvastatin  40 mg Oral Q M,W,F  . sodium chloride flush  3 mL Intravenous Q12H   Continuous Infusions: . sodium chloride 75 mL/hr at 09/18/16 1150   PRN Meds:.ondansetron **OR** ondansetron (ZOFRAN) IV Medications Prior to Admission:  Prior to Admission medications   Medication Sig Start Date  End Date Taking? Authorizing Provider  clopidogrel (PLAVIX) 75 MG tablet Take 75 mg by mouth daily.     Yes Historical Provider, MD  Dulaglutide (TRULICITY) 1.5 EL/9.5VU SOPN Inject 0.5 mLs into the skin once a week.   Yes Historical Provider, MD  lactulose (CHRONULAC) 10 GM/15ML solution Take 30 mLs (20 g total) by mouth 3 (three) times daily. 08/18/16  Yes Verlee Monte, MD  LANTUS SOLOSTAR 100 UNIT/ML Solostar Pen INJECT 20  TO 24 UNITS TWICE DAILY Patient taking differently: Inject 20 units into the skin two times a day 06/21/14  Yes Elayne Snare, MD  levothyroxine (SYNTHROID, LEVOTHROID) 75 MCG tablet Take 75 mcg by mouth daily. Brand Name Only   Yes Historical Provider, MD  magnesium oxide (MAG-OX) 400 MG tablet Take 1 tablet (400 mg total) by mouth daily. 08/18/16  Yes Verlee Monte, MD  MILK THISTLE PO Take 1 capsule by mouth 2 (two) times daily.   Yes Historical Provider, MD  Multiple Vitamins-Minerals (MULTIVITAMIN WITH MINERALS) tablet Take 1 tablet by mouth daily. 08/18/16  Yes Verlee Monte, MD  pantoprazole (PROTONIX) 40 MG tablet Take 40 mg by mouth daily.  07/18/14  Yes Historical Provider, MD  propranolol (INDERAL) 60 MG tablet Take 60 mg by mouth daily.  02/20/15  Yes Historical Provider, MD  rifaximin (XIFAXAN) 550 MG TABS tablet Take 1 tablet (550 mg total) by mouth 2 (two) times daily. 08/18/16  Yes Verlee Monte, MD  simvastatin (ZOCOR) 40 MG tablet Take 40 mg by mouth every Monday, Wednesday, and Friday.    Yes Historical Provider, MD  spironolactone (ALDACTONE) 100 MG tablet Take 100 mg by mouth daily.   Yes Historical Provider, MD  torsemide (DEMADEX) 20 MG tablet Take 40 mg by mouth 2 (two) times daily.  09/09/16  Yes Historical Provider, MD  VENTOLIN HFA 108 (90 BASE) MCG/ACT inhaler Inhale 2 puffs into the lungs every 6 (six) hours as needed for shortness of breath.  02/08/15  Yes Historical Provider, MD   Allergies  Allergen Reactions  . Biaxin [Clarithromycin] Shortness Of Breath  .  Gatifloxacin Shortness Of Breath  . Sulfamethoxazole Shortness Of Breath  . Sulfonamide Derivatives Shortness Of Breath  . Actos [Pioglitazone]     Reaction noted by PCP's office  . Augmentin [Amoxicillin-Pot Clavulanate]     Reaction not known by husband  . Farxiga [Dapagliflozin]     Reaction unknown by husband  . Invokana [Canagliflozin]     Reaction unknown by husband  . Latex Other (See Comments)    Reaction unknown by husband  . Metformin And Related     Reaction noted by PCP's office  . Tape Hives  . Penicillins Hives and Rash    Has patient had a PCN reaction causing immediate rash, facial/tongue/throat swelling, SOB or lightheadedness with hypotension: Yes Has patient had a PCN reaction causing severe rash involving mucus membranes or skin necrosis: No Has patient had a PCN reaction that required hospitalization: No Has patient had a PCN reaction occurring within the last 10 years: No If all of the above answers are "NO", then may proceed with Cephalosporin use.   Review of Systems + feeling weak overall, no chest pain, no dyspnea  Physical Exam Awake alert resting in bed S1 S2 Clear Abdomen soft, has bowel sounds Has foley No edema Awake alert oriented  Vital Signs: BP (!) 154/45   Pulse 76   Temp 97.4 F (36.3 C) (Oral)   Resp (!) 24   Ht _0  (1.676 m)   Wt 87.2 kg (192 lb 3.9 oz)   SpO2 99%   BMI 31.03 kg/m  Pain Assessment: No/denies pain       SpO2: SpO2: 99 % O2 Device:SpO2: 99 % O2 Flow Rate: .   IO: Intake/output summary:  Intake/Output Summary (Last 24 hours) at 09/18/16 1252 Last data filed at 09/18/16 1200  Gross per 24 hour  Intake  910 ml  Output             2375 ml  Net            -1465 ml    LBM: Last BM Date: 09/18/16 Baseline Weight: Weight: 87.2 kg (192 lb 3.9 oz) Most recent weight: Weight: 87.2 kg (192 lb 3.9 oz)     Palliative Assessment/Data:   Flowsheet Rows     Most Recent Value  Intake Tab    Referral Department  Hospitalist  Unit at Time of Referral  Intermediate Care Unit  Palliative Care Primary Diagnosis  Other (Comment) [NASH cirrhosis, CKD, hep enceph]  Palliative Care Type  New Palliative care  Reason for referral  Clarify Goals of Care  Date first seen by Palliative Care  09/18/16  Clinical Assessment  Palliative Performance Scale Score  40%  Pain Max last 24 hours  4  Pain Min Last 24 hours  3  Dyspnea Max Last 24 Hours  4  Dyspnea Min Last 24 hours  3  Nausea Max Last 24 Hours  4  Nausea Min Last 24 Hours  3  Psychosocial & Spiritual Assessment  Palliative Care Outcomes  Patient/Family meeting held?  Yes  Who was at the meeting?  patient and husband   Palliative Care Outcomes  Clarified goals of care      Time In:  11 Time Out:  12 Time Total:  60 min  Greater than 50%  of this time was spent counseling and coordinating care related to the above assessment and plan.  Signed by: Loistine Chance, MD  [phone]  Please contact Palliative Medicine Team phone at [phone] for questions and concerns.  For individual provider: See Shea Evans

## 2016-09-19 ENCOUNTER — Telehealth: Payer: Self-pay | Admitting: Cardiovascular Disease

## 2016-09-19 LAB — CBC
HEMATOCRIT: 26.6 % — AB (ref 36.0–46.0)
Hemoglobin: 8.7 g/dL — ABNORMAL LOW (ref 12.0–15.0)
MCH: 27.5 pg (ref 26.0–34.0)
MCHC: 32.7 g/dL (ref 30.0–36.0)
MCV: 84.2 fL (ref 78.0–100.0)
PLATELETS: 73 10*3/uL — AB (ref 150–400)
RBC: 3.16 MIL/uL — AB (ref 3.87–5.11)
RDW: 17.6 % — ABNORMAL HIGH (ref 11.5–15.5)
WBC: 5.7 10*3/uL (ref 4.0–10.5)

## 2016-09-19 LAB — RENAL FUNCTION PANEL
Albumin: 2.9 g/dL — ABNORMAL LOW (ref 3.5–5.0)
Anion gap: 11 (ref 5–15)
BUN: 40 mg/dL — ABNORMAL HIGH (ref 6–20)
CHLORIDE: 108 mmol/L (ref 101–111)
CO2: 22 mmol/L (ref 22–32)
CREATININE: 2.85 mg/dL — AB (ref 0.44–1.00)
Calcium: 7.5 mg/dL — ABNORMAL LOW (ref 8.9–10.3)
GFR, EST AFRICAN AMERICAN: 19 mL/min — AB (ref >60)
GFR, EST NON AFRICAN AMERICAN: 16 mL/min — AB (ref >60)
Glucose, Bld: 148 mg/dL — ABNORMAL HIGH (ref 65–99)
POTASSIUM: 4.1 mmol/L (ref 3.5–5.1)
Phosphorus: 3.4 mg/dL (ref 2.5–4.6)
Sodium: 141 mmol/L (ref 135–145)

## 2016-09-19 LAB — GLUCOSE, CAPILLARY
Glucose-Capillary: 144 mg/dL — ABNORMAL HIGH (ref 65–99)
Glucose-Capillary: 171 mg/dL — ABNORMAL HIGH (ref 65–99)

## 2016-09-19 LAB — URINE CULTURE: Culture: NO GROWTH

## 2016-09-19 MED ORDER — LACTULOSE 10 GM/15ML PO SOLN
10.0000 g | Freq: Three times a day (TID) | ORAL | Status: DC
  Administered 2016-09-19: 20 g via ORAL
  Filled 2016-09-19: qty 30

## 2016-09-19 MED ORDER — INSULIN GLARGINE 100 UNIT/ML SOLOSTAR PEN: 10.0000 [IU] | PEN_INJECTOR | Freq: Every day | SUBCUTANEOUS | 0 refills | Status: DC

## 2016-09-19 MED ORDER — LACTULOSE 10 GM/15ML PO SOLN
10.0000 g | Freq: Three times a day (TID) | ORAL | Status: DC
  Filled 2016-09-19: qty 15

## 2016-09-19 MED ORDER — PREMIER PROTEIN SHAKE: 11.0000 [oz_av] | Freq: Two times a day (BID) | ORAL | 0 refills | Status: DC

## 2016-09-19 MED ORDER — TORSEMIDE 20 MG PO TABS: 20.0000 mg | ORAL_TABLET | Freq: Every day | ORAL | 0 refills | Status: AC

## 2016-09-19 NOTE — Telephone Encounter (Signed)
Left a message to call back, per DPR. 

## 2016-09-19 NOTE — Telephone Encounter (Signed)
Spoke with pt dtr, the patient is wanting to switch to a new primary care. Advised dtr to call their office on Monday they may not require a referral. If they do she will call us back.

## 2016-09-19 NOTE — Progress Notes (Signed)
[facility] Progress Note   Subjective: creat down to 2.8, good UOP  Vitals:   09/18/16 1645 09/18/16 2051 09/19/16 0424 09/19/16 1325  BP:  (!) 152/54 (!) 144/51 (!) 129/46  Pulse:  84 78 65  Resp:  '18 16 16  '$ Temp:  98.6 F (37 C) 98.2 F (36.8 C) 97.8 F (36.6 C)  TempSrc:  Oral Oral Oral  SpO2:  99% 97% 100%  Weight: 87.1 kg (192 lb)     Height: '5\' 5"'$  (1.651 m)       Inpatient medications: . insulin aspart  0-5 Units Subcutaneous QHS  . insulin aspart  0-9 Units Subcutaneous TID WC  . insulin glargine  10 Units Subcutaneous Daily  . lactulose  10-20 g Oral TID  . levothyroxine  75 mcg Oral QAC breakfast  . pantoprazole  40 mg Oral Daily  . propranolol  60 mg Oral Daily  . protein supplement shake  11 oz Oral BID BM  . rifaximin  550 mg Oral BID  . simvastatin  40 mg Oral Q M,W,F  . sodium chloride flush  3 mL Intravenous Q12H   . sodium chloride 1,000 mL (09/19/16 0106)   acetaminophen, HYDROcodone-acetaminophen, ondansetron **OR** ondansetron (ZOFRAN) IV  Exam: Gen alert, no distress No jvd or bruits Chest clear bilat RRR no MRG Abd soft ntnd no mass or ascites +bs GU foley in place MS no joint effusions or deformity Ext no LE edema / no wounds or ulcers Neuro is alert, Ox 3 , nf  CXR read as IS edema, however old films (portables) all have similar appearance, except the PA/ Lat does not.   Na 144 K 3.9 CO2 24  BUN 44  Cr 3.35  Ca 7.5  Alb 2.9/ tprot 6.1  LFT's ok  tbili 1.2 eGFR 13  WBC 5k  Hb 8.8  plt 74  UOP 1675 last 24 hrs.   UA > not done Last UA 3/27 > negative   Date                 Creat               eGFR 2009                0.8 2010- 16          1.2- 1.4 Mar 18             2.0 -2.3 Apr 18              2.0- 2.3            21- 23 May 2, 18        3.64                 12 May 3, 18        3.35                 13    Assessment: 1. Acute on CKD4 - baseline eGFR only 22 ml/ min.  Improving with IVF's.  OK for dc  from my standpoint. Should cont to improve, hopefully to her baseline creat ~2- 2.3.  She is not a dialysis candidate given severe comorbitidities (advanced cirrhosis); I have explained this to pt and family yesterday and answered questions, they were understanding.  They have met with palliative care team subsequently as well.  She has been scheduled for f/u w/ [facility] in clinic on May 22nd.   2. AMS  d/t hepatic enceph - better 3. DM2 4. HTN - bp's soft today 5. Chronic thrombocytopenia 6. Anemia of chron disease   Plan - as above, will sign off.    Kelly Splinter MD [facility] pager [phone]   09/19/2016, 3:41 PM    Recent Labs Lab 09/17/16 1502 09/18/16 0353 09/19/16 0903  NA 141 144 141  K 5.2* 3.9 4.1  CL 106 111 108  CO2 '23 24 22  '$ GLUCOSE 120* 79 148*  BUN 48* 44* 40*  CREATININE 3.64* 3.35* 2.85*  CALCIUM 8.1* 7.5* 7.5*  PHOS  --   --  3.4    Recent Labs Lab 09/17/16 1502 09/18/16 0353 09/19/16 0903  AST 31 21  --   ALT 14 12*  --   ALKPHOS 97 82  --   BILITOT 1.2 1.2  --   PROT 7.2 6.1*  --   ALBUMIN 3.6 2.9* 2.9*    Recent Labs Lab 09/17/16 1502 09/18/16 0353 09/19/16 0903  WBC 6.5 5.6 5.7  NEUTROABS 4.9  --   --   HGB 10.7* 8.8* 8.7*  HCT 31.6* 26.9* 26.6*  MCV 82.9 83.5 84.2  PLT 78* 74* 73*   Iron/TIBC/Ferritin/ %Sat    Component Value Date/Time   IRON 22 (L) 03/18/2011 1538   IRON 22 (L) 03/18/2011 1538   FERRITIN 140.8 03/18/2011 1538   IRONPCTSAT 7.7 (L) 03/18/2011 1538

## 2016-09-19 NOTE — Discharge Instructions (Addendum)
Monica [name],  Your admitted because he became very confused secondary to hepatic encephalopathy. This is secondary to your liver failure. Your medications have been adjusted. Please take her lactulose 3 times daily. The goal for bowel movements is to 3 times per day with soft bowel movements. This episode could've been caused by the change to lactulose regimen or could have been contributed to by the addition of torsemide to medication regimen. Your torsemide has been changed to 20 mg once daily. This will need to be watched and adjusted by your primary care physician or cardiologist. While your year, your kidneys were low but injured secondary to dehydration. Your given some fluids which improved her kidney function, however, you still have significant kidney disease. The nephrologists evaluated you while you were here and you'll need continued follow-up with a nephrologist as an outpatient. Her insulin was adjusted. Your Lantus is now 10 units daily. This significant decrease is secondary to your dilator here in addition to your worsened kidney function. Your Lantus may need to be titrated sooner rather later so does imperative that you see your primary care physician. [name] be getting home health nursing and the nurse can check your blood sugar at home as well.

## 2016-09-19 NOTE — Progress Notes (Signed)
This CM was just contacted by [facility] home health rep Alexander BergeronSarah McKenzie who states that [facility] home [facility] will not be able to take pt back for home health services because they believe that pt needs a higher level of care. Maralyn SagoSarah states that they will get into contact with pt to let her know she will be discharged from their care. This CM informed MD and asked for PT eval.  Sandford Crazeora Laurine Kuyper RN,BSN,NCM [phone]

## 2016-09-19 NOTE — Care Management Note (Signed)
Case Management Note  Patient Details  Name: Monica AlmondDiane Y Chalmers MRN: [id number] Date of Birth: January 11, 1951  Subjective/Objective:  66 yo admitted with Hepatic Encephalopathy.                  Action/Plan: Pt from home with husband. Pt is currently active with [facility] for home health PT/OT/RN.  Will need resumption orders at discharge. CM will continue to follow.  Expected Discharge Date:   (unknown)               Expected Discharge Plan:  Home w Home Health Services  In-House Referral:     Discharge planning Services  CM Consult  Post Acute Care Choice:    Choice offered to:     DME Arranged:    DME Agency:     HH Arranged:    HH Agency:  [facility] ([facility])  Status of Service:  In process, will continue to follow  If discussed at Long Length of Stay Meetings, dates discussed:    Additional CommentsBartholome Bill:  Zarin Hagmann H, RN 09/19/2016, 1:19 PM  [phone]

## 2016-09-19 NOTE — Telephone Encounter (Signed)
New message     Daughter is calling back, missed Monica StanleyLisa call

## 2016-09-19 NOTE — Telephone Encounter (Signed)
Pt's dtr Tresa EndoKelly calling to see if can give her a referral [facility]-

## 2016-09-19 NOTE — Discharge Summary (Signed)
Physician Discharge Summary  Monica AlmondDiane Y Mcquillen QIO:[id number] DOB: 1950/06/02 DOA: 09/17/2016  PCP: Galvin ProfferHAGUE, IMRAN P, MD  Admit date: 09/17/2016 Discharge date: 09/19/2016  Admitted From: Home Disposition: Home  Recommendations for Outpatient Follow-up:  1. Follow up with PCP in 1 week 2. Follow up with nephrology on May 22nd 3. Please obtain BMP/CBC in one week  Home Health: PT, OT, RN Equipment/Devices: None  Discharge Condition: Guarded CODE STATUS: Full code Diet recommendation: Renal/carb modified diet   Brief/Interim Summary:  Chief Complaint: Altered mental status  HPI: Monica [name] is a 66 y.o. female with medical history significant of CAD, PVD, sleep apnea, diabetes mellitus, GERD, arthritis, NASH. Patient is unable to provide a history secondary to significant confusion. Per family, patient woke up today not acting herself. She spit up her medication this morning (no emesis). Yesterday, she was at her baseline. Recently, she had started taking her lactulose every other day per her cardiologist's recommendations. She was recently admitted for a similar presentation and intubated. She was discharged to SNF per patient's family and resided there for two weeks before returning home. She did very well, so her current presentation is unexpected for them.  ED Course: Vitals: Afebrile. Normal pulse, soft blood pressure. SpO2 of 100% on room air Labs: Hemoglobin 10.7, platelets of 78, potassium of 5.2, BUN of 48, creatinine of 3.64, ammonia of 82, pCO2 vein of 41, pO2 vein undetectable Imaging: CT unremarkable for acute abnormality Medications/Course: Lactulose enema, 1L NS bolus    Hospital course:  Acute hepatic encephalopathy Likely secondary to prescribed change in lactulose therapy. Patient responded well to lactulose enema and was transitioned to home lactulose dose of 20 g 3 times daily. Xifaxan was also restarted. Can continue to titrate as an outpatient for 2-3  stools.  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 INR 1.37. Thrombocytopenia. Palliative care was consulted and patient desires to remain full code with full scope of medical care. Recommend continued palliative care discussions as an outpatient. Continued beta blocker for prophylaxis.  Essential hypertension Antihypertensives initially held, but restarted beta blocker on patient's mental status improved.  Diabetes mellitus, insulin dependent Patient's Lantus was decreased to 10 units daily. Will possibly need to be titrated as an outpatient to achieve at goal fasting blood sugars.  Acute kidney injury on CKD 4 Baseline of 2.2. Presented with a Creatinine of 3.64 on admission with minimal improvement. Nephrology was consulted and hydrated patient. Patient deemed not a candidate for dialysis secondary to severe liver disease. Patient to follow-up with nephrology as an outpatient. Creatinine improved to 2.85 prior to discharge.  Chronic diastolic heart failure Last EF of 60-65% with no regional wall motion abnormalities and grade 1 diastolic dysfunction seen on echo from 10/27/2014. Stable. Held diuretics secondary to dehydration. Adjusted torsemide and decrease frequency and dose as this likely contributed to acute hepatic encephalopathy as well.  Thrombocytopenia Chronic and stable. No evidence of bleeding.  Anemia of chronic disease Normocytic. Hemoglobin stable  Hyperkalemia Resolved.  Hypothyroidism Continued Synthroid   Discharge Diagnoses:  Active Problems:   Type 2 diabetes mellitus with stage 4 chronic kidney disease (HCC)   GERD   Hypothyroidism   Esophageal varices without bleeding (HCC)   Hepatic encephalopathy (HCC)   Thrombocytopenia (HCC)   Normocytic anemia    Discharge Instructions  Discharge Instructions    Call MD for:  difficulty breathing, headache or visual disturbances    Complete by:  As directed    Call MD for:  persistant  dizziness or light-headedness    Complete by:  As directed    Call MD for:  persistant nausea and vomiting    Complete by:  As directed    Call MD for:  temperature >100.4    Complete by:  As directed    Diet - low sodium heart healthy    Complete by:  As directed    Increase activity slowly    Complete by:  As directed      Allergies as of 09/19/2016      Reactions   Biaxin [clarithromycin] Shortness Of Breath   Gatifloxacin Shortness Of Breath   Sulfamethoxazole Shortness Of Breath   Sulfonamide Derivatives Shortness Of Breath   Actos [pioglitazone]    Reaction noted by PCP's office   Augmentin [amoxicillin-pot Clavulanate]    Reaction not known by husband   [place] [dapagliflozin]    Reaction unknown by husband   Invokana [canagliflozin]    Reaction unknown by husband   Latex Other (See Comments)   Reaction unknown by husband   Metformin And Related    Reaction noted by PCP's office   Tape Hives   Penicillins Hives, Rash   Has patient had a PCN reaction causing immediate rash, facial/tongue/throat swelling, SOB or lightheadedness with hypotension: Yes Has patient had a PCN reaction causing severe rash involving mucus membranes or skin necrosis: No Has patient had a PCN reaction that required hospitalization: No Has patient had a PCN reaction occurring within the last 10 years: No If all of the above answers are "NO", then may proceed with Cephalosporin use.      Medication List    TAKE these medications   clopidogrel 75 MG tablet Commonly known as:  PLAVIX Take 75 mg by mouth daily.   Insulin Glargine 100 UNIT/ML Solostar Pen Commonly known as:  LANTUS SOLOSTAR Inject 10 Units into the skin daily. What changed:  See the new instructions.   lactulose 10 GM/15ML solution Commonly known as:  CHRONULAC Take 30 mLs (20 g total) by mouth 3 (three) times daily.   levothyroxine 75 MCG tablet Commonly known as:  SYNTHROID, LEVOTHROID Take 75 mcg by mouth daily. Brand  Name Only   magnesium oxide 400 MG tablet Commonly known as:  MAG-OX Take 1 tablet (400 mg total) by mouth daily.   MILK THISTLE PO Take 1 capsule by mouth 2 (two) times daily.   multivitamin with minerals tablet Take 1 tablet by mouth daily.   pantoprazole 40 MG tablet Commonly known as:  PROTONIX Take 40 mg by mouth daily.   propranolol 60 MG tablet Commonly known as:  INDERAL Take 60 mg by mouth daily.   protein supplement shake Liqd Commonly known as:  PREMIER PROTEIN Take 325 mLs (11 oz total) by mouth 2 (two) times daily between meals. Start taking on:  09/20/2016   rifaximin 550 MG Tabs tablet Commonly known as:  XIFAXAN Take 1 tablet (550 mg total) by mouth 2 (two) times daily.   simvastatin 40 MG tablet Commonly known as:  ZOCOR Take 40 mg by mouth every Monday, Wednesday, and Friday.   spironolactone 100 MG tablet Commonly known as:  ALDACTONE Take 100 mg by mouth daily.   torsemide 20 MG tablet Commonly known as:  DEMADEX Take 1 tablet (20 mg total) by mouth daily. What changed:  how much to take  when to take this   TRULICITY 1.5 MG/0.5ML Sopn Generic drug:  Dulaglutide Inject 0.5 mLs into the skin once  a week.   VENTOLIN HFA 108 (90 Base) MCG/ACT inhaler Generic drug:  albuterol Inhale 2 puffs into the lungs every 6 (six) hours as needed for shortness of breath.      Follow-up Information    HAGUE, Myrene Galas, MD. Schedule an appointment as soon as possible for a visit in 1 week(s).   Specialty:  Internal Medicine Contact information: [address] [place] [address] [phone]        [facility]. Schedule an appointment as soon as possible for a visit in 1 week(s).   Contact information: [address] [place] [address] [phone]          Allergies  Allergen Reactions  . Biaxin [Clarithromycin] Shortness Of Breath  . Gatifloxacin Shortness Of Breath  . Sulfamethoxazole Shortness Of Breath  . Sulfonamide  Derivatives Shortness Of Breath  . Actos [Pioglitazone]     Reaction noted by PCP's office  . Augmentin [Amoxicillin-Pot Clavulanate]     Reaction not known by husband  . Farxiga [Dapagliflozin]     Reaction unknown by husband  . Invokana [Canagliflozin]     Reaction unknown by husband  . Latex Other (See Comments)    Reaction unknown by husband  . Metformin And Related     Reaction noted by PCP's office  . Tape Hives  . Penicillins Hives and Rash    Has patient had a PCN reaction causing immediate rash, facial/tongue/throat swelling, SOB or lightheadedness with hypotension: Yes Has patient had a PCN reaction causing severe rash involving mucus membranes or skin necrosis: No Has patient had a PCN reaction that required hospitalization: No Has patient had a PCN reaction occurring within the last 10 years: No If all of the above answers are "NO", then may proceed with Cephalosporin use.    Consultations:  Nephrology   Procedures/Studies: Ct Head Wo Contrast  Result Date: 09/17/2016 CLINICAL DATA:  AMS. Hx of elevated ammonia levels. Has had AMS since 0600. Family reports her AMS is typical for when her ammonia levels become elevated. Hx of non-compliance with lactulose. Pt disoriented 4x. combative Unable to hold still EXAM: CT HEAD WITHOUT CONTRAST TECHNIQUE: Contiguous axial images were obtained from the base of the skull through the vertex without intravenous contrast. COMPARISON:  08/12/2016 FINDINGS: Brain: No evidence of acute infarction, hemorrhage, hydrocephalus, extra-axial collection or mass lesion/mass effect. Vascular: There is atherosclerotic calcification of the carotid siphonsand vertebral arteries. Skull: Normal. Negative for fracture or focal lesion. Sinuses/Orbits: No acute finding. Other: There is significant patient motion artifact. IMPRESSION: 1.  No evidence for acute  abnormality. 2. Patient motion artifact Electronically Signed   By: Norva Pavlov M.D.   On:  09/17/2016 15:55   US Renal  Result Date: 09/18/2016 CLINICAL DATA:  Renal failure, cirrhosis. EXAM: RENAL / URINARY TRACT ULTRASOUND COMPLETE COMPARISON:  Abdominal ultrasound of July 11, 2014 FINDINGS: Right Kidney: Length: 11.6 cm. The renal cortical echotexture is increased diffusely. There is no focal mass or hydronephrosis. Left Kidney: Length: 12.2 cm. The renal cortical echotexture is increased similar to that on the right. There is a mid upper pole cortical cyst measuring 6 mm in diameter. There is no hydronephrosis. Bladder: The urinary bladder is decompressed with a Foley catheter. There is ascites and splenomegaly. IMPRESSION: Increased renal cortical echotexture bilaterally consistent with medical renal disease. There is no hydronephrosis. Ascites and splenomegaly. Electronically Signed   By: David  [place] M.D.   On: 09/18/2016 10:48   Dg Chest Port 1  View  Result Date: 09/17/2016 CLINICAL DATA:  Altered mental status. History of hepatic encephalopathy. EXAM: PORTABLE CHEST 1 VIEW COMPARISON:  Chest radiograph August 15, 2016 FINDINGS: Cardiac silhouette is upper limits of normal in size, mediastinal silhouette is nonsuspicious. Pulmonary vascular congestion and diffuse mild interstitial prominence without pleural effusion or focal consolidation. No pneumothorax. Surgical clips LEFT neck, osteopenia. IMPRESSION: Borderline cardiomegaly. Interstitial edema without focal consolidation. Electronically Signed   By: Awilda Metro M.D.   On: 09/17/2016 18:29      Subjective: Patient reports about 4 stools per day. She has been having good urine output. No chest pain or dyspnea. No abdominal pain.  Discharge Exam: Vitals:   09/19/16 0424 09/19/16 1325  BP: (!) 144/51 (!) 129/46  Pulse: 78 65  Resp: 16 16  Temp: 98.2 F (36.8 C) 97.8 F (36.6 C)   Vitals:   09/18/16 1645 09/18/16 2051 09/19/16 0424 09/19/16 1325  BP:  (!) 152/54 (!) 144/51 (!) 129/46  Pulse:  84 78 65  Resp:   18 16 16   Temp:  98.6 F (37 C) 98.2 F (36.8 C) 97.8 F (36.6 C)  TempSrc:  Oral Oral Oral  SpO2:  99% 97% 100%  Weight: 87.1 kg (192 lb)     Height: 5\' 5"  (1.651 m)       General exam: Appears calm and comfortable  Respiratory system: Clear to auscultation. Respiratory effort normal. Cardiovascular system: S1 & S2 heard, RRR. No murmurs. Gastrointestinal system: Abdomen is nondistended, soft and nontender. Normal bowel sounds heard. Central nervous system: Alert and oriented. No focal neurological deficits. Asterixis Extremities: No edema. No calf tenderness Skin: No cyanosis. No rashes Psychiatry: Judgement and insight appear normal. Mood & affect appropriate.   The results of significant diagnostics from this hospitalization (including imaging, microbiology, ancillary and laboratory) are listed below for reference.     Microbiology: Recent Results (from the past 240 hour(s))  MRSA PCR Screening     Status: None   Collection Time: 09/17/16  9:51 PM  Result Value Ref Range Status   MRSA by PCR NEGATIVE NEGATIVE Final    Comment:        The GeneXpert MRSA Assay (FDA approved for NASAL specimens only), is one component of a comprehensive MRSA colonization surveillance program. It is not intended to diagnose MRSA infection nor to guide or monitor treatment for MRSA infections.   Urine culture     Status: None   Collection Time: 09/18/16 10:26 AM  Result Value Ref Range Status   Specimen Description URINE, CATHETERIZED  Final   Special Requests NONE  Final   Culture   Final    NO GROWTH Performed at [facility], [address] [place], [place] [address]    Report Status 09/19/2016 FINAL  Final     Labs: BNP (last 3 results) No results for input(s): BNP in the last 8760 hours. Basic Metabolic Panel:  Recent Labs Lab 09/17/16 1502 09/18/16 0353 09/19/16 0903  NA 141 144 141  K 5.2* 3.9 4.1  CL 106 111 108  CO2 23 24 22   GLUCOSE 120* 79 148*  BUN  48* 44* 40*  CREATININE 3.64* 3.35* 2.85*  CALCIUM 8.1* 7.5* 7.5*  PHOS  --   --  3.4   Liver Function Tests:  Recent Labs Lab 09/17/16 1502 09/18/16 0353 09/19/16 0903  AST 31 21  --   ALT 14 12*  --   ALKPHOS 97 82  --  BILITOT 1.2 1.2  --   PROT 7.2 6.1*  --   ALBUMIN 3.6 2.9* 2.9*   No results for input(s): LIPASE, AMYLASE in the last 168 hours.  Recent Labs Lab 09/17/16 1619  AMMONIA 82*   CBC:  Recent Labs Lab 09/17/16 1502 09/18/16 0353 09/19/16 0903  WBC 6.5 5.6 5.7  NEUTROABS 4.9  --   --   HGB 10.7* 8.8* 8.7*  HCT 31.6* 26.9* 26.6*  MCV 82.9 83.5 84.2  PLT 78* 74* 73*   Cardiac Enzymes: No results for input(s): CKTOTAL, CKMB, CKMBINDEX, TROPONINI in the last 168 hours. BNP: Invalid input(s): POCBNP CBG:  Recent Labs Lab 09/18/16 1300 09/18/16 1736 09/18/16 2047 09/19/16 0731 09/19/16 1156  GLUCAP 173* 170* 155* 144* 171*   D-Dimer No results for input(s): DDIMER in the last 72 hours. Hgb A1c No results for input(s): HGBA1C in the last 72 hours. Lipid Profile No results for input(s): CHOL, HDL, LDLCALC, TRIG, CHOLHDL, LDLDIRECT in the last 72 hours. Thyroid function studies  Recent Labs  09/18/16 0353  TSH 1.611   Anemia work up No results for input(s): VITAMINB12, FOLATE, FERRITIN, TIBC, IRON, RETICCTPCT in the last 72 hours. Urinalysis    Component Value Date/Time   COLORURINE STRAW (A) 09/18/2016 1026   APPEARANCEUR CLEAR 09/18/2016 1026   LABSPEC 1.006 09/18/2016 1026   PHURINE 5.0 09/18/2016 1026   GLUCOSEU NEGATIVE 09/18/2016 1026   GLUCOSEU NEGATIVE 08/31/2013 1516   HGBUR SMALL (A) 09/18/2016 1026   BILIRUBINUR NEGATIVE 09/18/2016 1026   KETONESUR NEGATIVE 09/18/2016 1026   PROTEINUR NEGATIVE 09/18/2016 1026   UROBILINOGEN 0.2 08/31/2013 1516   NITRITE NEGATIVE 09/18/2016 1026   LEUKOCYTESUR NEGATIVE 09/18/2016 1026   Sepsis Labs Invalid input(s): PROCALCITONIN,  WBC,  LACTICIDVEN Microbiology Recent Results  (from the past 240 hour(s))  MRSA PCR Screening     Status: None   Collection Time: 09/17/16  9:51 PM  Result Value Ref Range Status   MRSA by PCR NEGATIVE NEGATIVE Final    Comment:        The GeneXpert MRSA Assay (FDA approved for NASAL specimens only), is one component of a comprehensive MRSA colonization surveillance program. It is not intended to diagnose MRSA infection nor to guide or monitor treatment for MRSA infections.   Urine culture     Status: None   Collection Time: 09/18/16 10:26 AM  Result Value Ref Range Status   Specimen Description URINE, CATHETERIZED  Final   Special Requests NONE  Final   Culture   Final    NO GROWTH Performed at [facility], [address] [place], [place] [address]    Report Status 09/19/2016 FINAL  Final     Time coordinating discharge: Over 30 minutes  SIGNED:   Jacquelin Hawking, MD [facility] 09/19/2016, 3:22 PM Pager [phone]  If 7PM-7AM, please contact night-coverage [email or web address] Password [email or web address]

## 2016-09-19 NOTE — Progress Notes (Signed)
Pt states she is not going to a SNF and wants to go home. This CM offered choice for home health services and [facility]. [facility] rep alerted of referral. Per pt she will dc home via daughter's car. Pt states she doesn't need any additional equipment. Sandford Crazeora Addasyn Mcbreen RN,BSN,NCM [phone]

## 2016-09-26 ENCOUNTER — Ambulatory Visit (HOSPITAL_COMMUNITY)
Admission: RE | Admit: 2016-09-26 | Discharge: 2016-09-26 | Disposition: A | Discharge status: Home / Self Care | Payer: Medicare Other | Source: Ambulatory Visit | Attending: Cardiovascular Disease | Admitting: Cardiovascular Disease

## 2016-09-26 ENCOUNTER — Other Ambulatory Visit: Payer: Self-pay

## 2016-09-26 ENCOUNTER — Ambulatory Visit (HOSPITAL_COMMUNITY): Payer: Medicare Other | Attending: Cardiology

## 2016-09-26 DIAGNOSIS — I739 Peripheral vascular disease, unspecified: Secondary | ICD-10-CM

## 2016-09-26 DIAGNOSIS — I10 Essential (primary) hypertension: Secondary | ICD-10-CM | POA: Insufficient documentation

## 2016-09-26 DIAGNOSIS — E785 Hyperlipidemia, unspecified: Secondary | ICD-10-CM | POA: Diagnosis not present

## 2016-09-26 DIAGNOSIS — I2510 Atherosclerotic heart disease of native coronary artery without angina pectoris: Secondary | ICD-10-CM | POA: Diagnosis present

## 2016-09-26 DIAGNOSIS — R51 Headache: Secondary | ICD-10-CM | POA: Insufficient documentation

## 2016-09-26 DIAGNOSIS — Z87898 Personal history of other specified conditions: Secondary | ICD-10-CM | POA: Insufficient documentation

## 2016-09-26 DIAGNOSIS — I779 Disorder of arteries and arterioles, unspecified: Secondary | ICD-10-CM

## 2016-09-26 DIAGNOSIS — R0600 Dyspnea, unspecified: Secondary | ICD-10-CM

## 2016-09-26 DIAGNOSIS — R42 Dizziness and giddiness: Secondary | ICD-10-CM | POA: Diagnosis not present

## 2016-09-26 DIAGNOSIS — R4182 Altered mental status, unspecified: Secondary | ICD-10-CM | POA: Diagnosis not present

## 2016-09-26 DIAGNOSIS — I6523 Occlusion and stenosis of bilateral carotid arteries: Secondary | ICD-10-CM | POA: Insufficient documentation

## 2016-09-26 DIAGNOSIS — I083 Combined rheumatic disorders of mitral, aortic and tricuspid valves: Secondary | ICD-10-CM | POA: Diagnosis not present

## 2016-09-26 DIAGNOSIS — R0989 Other specified symptoms and signs involving the circulatory and respiratory systems: Secondary | ICD-10-CM

## 2016-09-26 DIAGNOSIS — Z8673 Personal history of transient ischemic attack (TIA), and cerebral infarction without residual deficits: Secondary | ICD-10-CM | POA: Diagnosis not present

## 2016-09-26 DIAGNOSIS — E119 Type 2 diabetes mellitus without complications: Secondary | ICD-10-CM | POA: Diagnosis not present

## 2016-09-29 ENCOUNTER — Telehealth: Payer: Self-pay | Admitting: Cardiovascular Disease

## 2016-09-29 ENCOUNTER — Other Ambulatory Visit: Payer: Self-pay | Admitting: Cardiovascular Disease

## 2016-09-29 DIAGNOSIS — R079 Chest pain, unspecified: Secondary | ICD-10-CM

## 2016-09-29 DIAGNOSIS — I779 Disorder of arteries and arterioles, unspecified: Secondary | ICD-10-CM

## 2016-09-29 DIAGNOSIS — I739 Peripheral vascular disease, unspecified: Principal | ICD-10-CM

## 2016-09-29 NOTE — Telephone Encounter (Signed)
ECHOCARDIOGRAM COMPLETE  Order: [id number]  Status:  Final result Visible to patient:  Yes (MyChart) Dx:  Bilateral carotid artery disease (HCC...  Notes recorded by Evans LanceStover, Derrian Poli W on 09/29/2016 at 2:14 PM EDT Results given to pt. She is aware that someone will call her to schedule Myoview. Pt asked if we will send results to PCP, however she wants to change her PCP and wanted Dr. Allyson SabalBerry to know she was not able to get an appt with Dr. Lorin PicketScott. Wants to know if any other PCP recommendations?  Pt stated she has been having some "weird pains in her chest" and asked if she should be watching for anything. I explained that if she experiences any severe or worsening symptoms such as chest pain, SOB, sweating, dizziness, pain in jaw, shoulder blades or left arm to go to the ED. Pt verbalized understanding and appreciation for the call. ------  Notes recorded by Runell GessBerry, Jonathan J, MD on 09/29/2016 at 10:45 AM EDT Normal LV function with an inferior wall motion abnormality. We'll need to order a pharmacologic Myoview stress test to further evaluate.

## 2016-09-30 NOTE — Telephone Encounter (Signed)
Spoke with pt questions answered

## 2016-09-30 NOTE — Telephone Encounter (Signed)
New message    Pt wants to speak to you about the Myoview

## 2016-10-02 ENCOUNTER — Telehealth: Payer: Self-pay | Admitting: Cardiovascular Disease

## 2016-10-02 NOTE — Telephone Encounter (Signed)
Pt wants to know if she is supposed to be taking Isosorbide? She says she is fell nauseated today,wants to know if she can take some Phenergan?

## 2016-10-02 NOTE — Telephone Encounter (Signed)
Returned call to patient no answer.LMTC. 

## 2016-10-03 NOTE — Telephone Encounter (Signed)
Spoke to patient . Informed per last discharge summary yes- should be taking isosorbide 20 mg  Okay to use phenergan Patient voice understanding Medication added to patient list.

## 2016-10-03 NOTE — Telephone Encounter (Signed)
Left message to call back - yes from last hospital note 43/ should be taking isosorbide . Okay to use phenergan

## 2016-10-07 ENCOUNTER — Other Ambulatory Visit: Payer: Medicare Other

## 2016-10-07 ENCOUNTER — Ambulatory Visit (INDEPENDENT_AMBULATORY_CARE_PROVIDER_SITE_OTHER): Payer: Medicare Other | Admitting: Gastroenterology

## 2016-10-07 ENCOUNTER — Telehealth (HOSPITAL_COMMUNITY): Payer: Self-pay

## 2016-10-07 ENCOUNTER — Encounter: Payer: Self-pay | Admitting: Gastroenterology

## 2016-10-07 VITALS — BP 130/60 | HR 60 | Ht 65.0 in | Wt 158.7 lb

## 2016-10-07 DIAGNOSIS — K7581 Nonalcoholic steatohepatitis (NASH): Secondary | ICD-10-CM

## 2016-10-07 DIAGNOSIS — K7290 Hepatic failure, unspecified without coma: Secondary | ICD-10-CM | POA: Diagnosis not present

## 2016-10-07 DIAGNOSIS — K7460 Unspecified cirrhosis of liver: Secondary | ICD-10-CM | POA: Diagnosis not present

## 2016-10-07 DIAGNOSIS — I2510 Atherosclerotic heart disease of native coronary artery without angina pectoris: Secondary | ICD-10-CM

## 2016-10-07 DIAGNOSIS — K7682 Hepatic encephalopathy: Secondary | ICD-10-CM

## 2016-10-07 NOTE — Patient Instructions (Signed)
Please go to the basement level to have your labs drawn.   You have been scheduled for an abdominal [facility] (1st floor of hospital) on Friday 5-25 at 8:30 am. Please arrive at 8:15 minutes  to your appointment for registration. Make certain not to have anything to eat or drink after midnight  to your appointment. Should you need to reschedule your appointment, please contact radiology at [phone]. This test typically takes about 30 minutes to perform.  Decrease the Lactulose to twice daily. Take Nepro supplement drinks.

## 2016-10-07 NOTE — Telephone Encounter (Signed)
Encounter complete. 

## 2016-10-07 NOTE — Progress Notes (Signed)
10/07/2016 Monica [name] [id number] 1950/12/19   HISTORY OF PRESENT ILLNESS:  This is a 66 year old female who is known to Dr. 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 She's not been seen here since October 2016. She was recently hospitalized and discharged on May 4 for hepatic encephalopathy after she decreased her lactulose use to once every other day. She responded well to lactulose enemas and was discharged after a 2 day stay. She is now on lactose 3 times a day and is having 5-6 bowel movements a day.  Also on Xifaxan 550 mg BID.  She has minimal lower extremity edema and no appreciable ascites. Her weight is down 100 pounds since she was seen here October 2016. She is on torsemide and spironolactone, but those are being controlled by her nephrologist due to her chronic kidney disease.  Has not had any recent abdominal imaging or HCC screening.  Recent labs show hemoglobin of 8.7 g, platelets 73, INR 1.37, LFTs normal, creatinine 3.35. She just had an appointment with nephrology this morning.  Last EGD was in January 2013 at which time she is found to have grade 1 distal esophageal varices and a hiatal hernia. She is on propanolol 60 mg daily.   Past Medical History:  Diagnosis Date  . Bilateral lower extremity edema   . Carotid artery bruit    Doppler 07/09/2011 - abnormal study, no change from previous  . Chest pain 09/02/2012   Normal, EF-67  . Coronary atherosclerosis of unspecified type of vessel, native or graft   . Endometrial polyp   . Esophageal varices (HCC)   . GERD (gastroesophageal reflux disease)   . Hiatal hernia   . Hyperlipidemia   . Hypertension   . Iron deficiency anemia   . Irritable bowel syndrome   . Macular degeneration (senile) of retina, unspecified   . Obesity, unspecified   . Osteoarthrosis, unspecified whether generalized or localized, unspecified site     . 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 SOB (shortness of breath)    Normal 2D echo - EF-58, 03/18/2001  . Tobacco abuse   . Type II or unspecified type diabetes mellitus without mention of complication, not stated as uncontrolled   . Unspecified sleep apnea    Past Surgical History:  Procedure Laterality Date  . APPENDECTOMY    . CARDIAC CATHETERIZATION  03/18/2007   Normal coronary arteries and LV function  . CARDIAC CATHETERIZATION  07/01/2001   Normal cath  . CARDIAC CATHETERIZATION  05/17/1997   Normal cath  . CAROTID ENDARTERECTOMY    . CHOLECYSTECTOMY      reports that she quit smoking about 2 months ago. Her smoking use included Cigarettes. She smoked 0.00 packs per day. She has never used smokeless tobacco. She reports that she does not drink alcohol or use drugs. family history includes Breast cancer in her mother and sister; Colon cancer in her paternal aunt; Diabetes in her brother, father, and sister; Stomach cancer in her paternal aunt. Allergies  Allergen Reactions  . Biaxin [Clarithromycin] Shortness Of Breath  . Gatifloxacin Shortness Of Breath  . Sulfamethoxazole Shortness Of Breath  . Sulfonamide Derivatives Shortness Of Breath  . Actos [Pioglitazone]     Reaction noted by PCP's office  . Augmentin [Amoxicillin-Pot Clavulanate]     Reaction not known by husband  . Marcelline Deist [  Dapagliflozin]     Reaction unknown by husband  . Invokana [Canagliflozin]     Reaction unknown by husband  . Latex Other (See Comments)    Reaction unknown by husband  . Metformin And Related     Reaction noted by PCP's office  . Tape Hives  . Penicillins Hives and Rash    Has patient had a PCN reaction causing immediate rash, facial/tongue/throat swelling, SOB or lightheadedness with hypotension: Yes Has patient had a PCN reaction causing severe rash involving mucus membranes or skin necrosis: No Has patient had  a PCN reaction that required hospitalization: No Has patient had a PCN reaction occurring within the last 10 years: No If all of the above answers are "NO", then may proceed with Cephalosporin use.      Outpatient Encounter Prescriptions as of 10/07/2016  Medication Sig  . clopidogrel (PLAVIX) 75 MG tablet Take 75 mg by mouth daily.    . Dulaglutide (TRULICITY) 1.5 MG/0.5ML SOPN Inject 0.5 mLs into the skin once a week.  . Insulin Glargine (LANTUS SOLOSTAR) 100 UNIT/ML Solostar Pen Inject 10 Units into the skin daily.  . isosorbide mononitrate (ISMO,MONOKET) 20 MG tablet Take 20 mg by mouth daily.   Marland Kitchen lactulose (CHRONULAC) 10 GM/15ML solution Take 30 mLs (20 g total) by mouth 3 (three) times daily.  Marland Kitchen levothyroxine (SYNTHROID, LEVOTHROID) 75 MCG tablet Take 75 mcg by mouth daily. Brand Name Only  . magnesium oxide (MAG-OX) 400 MG tablet Take 1 tablet (400 mg total) by mouth daily.  Marland Kitchen MILK THISTLE PO Take 1 capsule by mouth 2 (two) times daily.  . Multiple Vitamins-Minerals (MULTIVITAMIN WITH MINERALS) tablet Take 1 tablet by mouth daily.  . pantoprazole (PROTONIX) 40 MG tablet Take 40 mg by mouth daily.   . propranolol (INDERAL) 60 MG tablet Take 60 mg by mouth daily.   . rifaximin (XIFAXAN) 550 MG TABS tablet Take 1 tablet (550 mg total) by mouth 2 (two) times daily.  . simvastatin (ZOCOR) 40 MG tablet Take 40 mg by mouth every Monday, Wednesday, and Friday.   Marland Kitchen spironolactone (ALDACTONE) 100 MG tablet Take 100 mg by mouth daily.  Marland Kitchen torsemide (DEMADEX) 20 MG tablet Take 1 tablet (20 mg total) by mouth daily.  . VENTOLIN HFA 108 (90 BASE) MCG/ACT inhaler Inhale 2 puffs into the lungs every 6 (six) hours as needed for shortness of breath.   . [DISCONTINUED] protein supplement shake (PREMIER PROTEIN) LIQD Take 325 mLs (11 oz total) by mouth 2 (two) times daily between meals.   No facility-administered encounter medications on file as of 10/07/2016.      REVIEW OF SYSTEMS  : All other  systems reviewed and negative except where noted in the History of Present Illness.   PHYSICAL EXAM: BP 130/60   Pulse 60   Ht 5\' 5"  (1.651 m)   Wt 158 lb 11.2 oz (72 kg)   BMI 26.41 kg/m  General: Well developed white female in no acute distress Head: Normocephalic and atraumatic Eyes:  Sclerae anicteric, conjunctiva pink. Ears: Normal auditory acuity Lungs: Clear throughout to auscultation Heart: Regular rate and rhythm Abdomen: Soft, non-distended.  BS present.  Scar noted on right abdomen, somewhat tender over that area.  Otherwise non-tender. Musculoskeletal: Symmetrical with no gross deformities  Skin: No lesions on visible extremities Extremities: 1+ pitting edema in B/L LE's Neurological: Alert oriented x 4, grossly non-focal Psychological:  Alert and cooperative. Normal mood and affect  ASSESSMENT AND PLAN: -NASH cirrhosis with portal  hypertension including history of ascites/LE edema, hepatic encephalopathy and esophageal varices.  Other than her recent issues with encephalopathy she has been stable from a liver standpoint. Her ascites and lower extremity edema are much improved. Nephrology is regulating her diuretics due to her kidney disease. Since she is having 5-6 bowel movements a day we will back down the lactulose to twice a day and see how she doesn't that. We'll check an AFP level and abdominal ultrasound for HCC screening.  Recommended Nepro drink supplements if ok with nephrology.  ? If and when another EGD should be performed.   CC:  Shelbie AmmonsHaque, Imran P, MD

## 2016-10-07 NOTE — Progress Notes (Signed)
Assessment and plan reviewed. The patient does NOT need follow-up EGD as she is on beta blocker

## 2016-10-08 ENCOUNTER — Telehealth: Payer: Self-pay | Admitting: Gastroenterology

## 2016-10-08 LAB — AFP TUMOR MARKER: AFP-Tumor Marker: 3.4 ng/mL (ref <6.1)

## 2016-10-08 NOTE — Telephone Encounter (Signed)
Pt has been given the number to radiology to call and reschedule her appt for a better date and time

## 2016-10-09 ENCOUNTER — Inpatient Hospital Stay (HOSPITAL_COMMUNITY): Admission: RE | Admit: 2016-10-09 | Payer: Medicare Other | Source: Ambulatory Visit

## 2016-10-10 ENCOUNTER — Ambulatory Visit (HOSPITAL_COMMUNITY): Payer: Medicare Other

## 2016-10-14 ENCOUNTER — Telehealth: Payer: Self-pay | Admitting: Internal Medicine

## 2016-10-16 NOTE — Telephone Encounter (Signed)
Left message for patient asking if she was still having symptoms such that she needed a refill of Xifaxan.

## 2016-10-17 ENCOUNTER — Telehealth: Payer: Self-pay | Admitting: Internal Medicine

## 2016-10-17 ENCOUNTER — Ambulatory Visit (HOSPITAL_COMMUNITY)
Admission: RE | Admit: 2016-10-17 | Discharge: 2016-10-17 | Disposition: A | Discharge status: Home / Self Care | Payer: Medicare Other | Source: Ambulatory Visit | Attending: Gastroenterology | Admitting: Gastroenterology

## 2016-10-17 DIAGNOSIS — K7290 Hepatic failure, unspecified without coma: Secondary | ICD-10-CM

## 2016-10-17 DIAGNOSIS — K7460 Unspecified cirrhosis of liver: Secondary | ICD-10-CM | POA: Insufficient documentation

## 2016-10-17 DIAGNOSIS — Z9049 Acquired absence of other specified parts of digestive tract: Secondary | ICD-10-CM | POA: Diagnosis not present

## 2016-10-17 DIAGNOSIS — R161 Splenomegaly, not elsewhere classified: Secondary | ICD-10-CM | POA: Insufficient documentation

## 2016-10-17 DIAGNOSIS — K7581 Nonalcoholic steatohepatitis (NASH): Secondary | ICD-10-CM

## 2016-10-17 DIAGNOSIS — K7682 Hepatic encephalopathy: Secondary | ICD-10-CM

## 2016-10-20 ENCOUNTER — Telehealth: Payer: Self-pay | Admitting: Gastroenterology

## 2016-10-20 NOTE — Telephone Encounter (Signed)
Shanda BumpsJessica have you had a chance to review the US report?

## 2016-10-21 NOTE — Telephone Encounter (Signed)
Spoke with patient who told me she had finally gotten her Xifaxan at an affordable price.  Patient asked if her ultrasound had been read.  I looked it up and saw that Shanda BumpsJessica had put in a note instructing patient to be called with results.  I told patient that the ultrasound was normal and showed no masses.  Patient acknowledged and understood.  Called Jessica's nurse Patty and told her I had relayed the information to her patient.

## 2016-10-21 NOTE — Telephone Encounter (Signed)
See encounter dated 10/21/2016

## 2016-12-10 ENCOUNTER — Ambulatory Visit: Payer: Medicare Other | Admitting: Physician Assistant

## 2016-12-23 ENCOUNTER — Ambulatory Visit (INDEPENDENT_AMBULATORY_CARE_PROVIDER_SITE_OTHER): Admitting: Physician Assistant

## 2016-12-23 ENCOUNTER — Encounter: Payer: Self-pay | Admitting: Physician Assistant

## 2016-12-23 VITALS — BP 102/58 | HR 58 | Ht 65.0 in | Wt 149.0 lb

## 2016-12-23 DIAGNOSIS — N184 Chronic kidney disease, stage 4 (severe): Secondary | ICD-10-CM | POA: Diagnosis not present

## 2016-12-23 DIAGNOSIS — I779 Disorder of arteries and arterioles, unspecified: Secondary | ICD-10-CM | POA: Diagnosis not present

## 2016-12-23 DIAGNOSIS — I739 Peripheral vascular disease, unspecified: Principal | ICD-10-CM

## 2016-12-23 NOTE — Progress Notes (Signed)
Cardiology Office Note   Date:  12/23/2016   ID:  Monica [name], DOB 03-12-1951, MRN [id number]  PCP:  Shelbie AmmonsHaque, Imran P, MD  Cardiologist:  Dr. Allyson SabalBerry, 09/10/2016  Theodore DemarkBarrett, Jaydan Chretien, PA-C   Chief Complaint  Patient presents with  . Follow-up    pt is on hospice care as of June    History of Present Illness: Monica AlmondDiane Y [name] is a 66 y.o. female with a history of L-CEA & R carotid 100%, nl cors at cath 2008 (MV was false+), DM, HTN, HLD, iron def anemia, IBS, nl EF w/ grade 1 dd by echo 09/2016, COPD, NASH  05/02-05/08/2016 for confusion, acute hepatic encephalopathy. Pt had lactulose enema>>20 mg tid. Xifaxan restarted. NASH w/ cirrhosis, thrombocytopenia. Hospice and Palliative Care were consulted and are involved in her care, but pt is still a full code.  08/2016 office visit, patient was complaining of DOE>>EF nl but new WMA>>MV ordered, not done yet.   Monica [name] presents for cardiology follow up.   She has good days and bad days. She has abdominal pain at times, from the abdominal issues. Her abdomen is always tender.   She is able to get around pretty well. She benefited from a stay in rehab a couple of months ago.   Her stamina is poor, she has an aide to help her do ADLs.   She had CP early this am, was awake but still in bed. It was 5/10, the worst episode was a 7/10. She may get this 1-2 x month. She does not have consistent exertional chest pain, but occasionally has it with exertion. She prefers not to have the MV now, will let us know if she changes her mind.   She is not having LE edema. She denies orthopnea or PND. She has no change in her DOE. Her weight was over 230 lbs in April 2018, she has lost weight steadily since then. Her appetite is poor. She eats very little.   Past Medical History:  Diagnosis Date  . Bilateral lower extremity edema   . Carotid artery bruit    Doppler 07/09/2011 - abnormal study, no change from previous  . Chest pain 09/02/2012   Normal, EF-67  . Coronary atherosclerosis of unspecified type of vessel, native or graft   . Endometrial polyp   . Esophageal varices (HCC)   . GERD (gastroesophageal reflux disease)   . Hiatal hernia   . Hyperlipidemia   . Hypertension   . Iron deficiency anemia   . Irritable bowel syndrome   . Macular degeneration (senile) of retina, unspecified   . Obesity, unspecified   . Osteoarthrosis, unspecified whether generalized or localized, unspecified site   . 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 SOB (shortness of breath)    Normal 2D echo - EF-58, 03/18/2001  . Tobacco abuse   . Type II or unspecified type diabetes mellitus without mention of complication, not stated as uncontrolled   . Unspecified sleep apnea     Past Surgical History:  Procedure Laterality Date  . APPENDECTOMY    . CARDIAC CATHETERIZATION  03/18/2007   Normal coronary arteries and LV function  . CARDIAC CATHETERIZATION  07/01/2001   Normal cath  . CARDIAC CATHETERIZATION  05/17/1997   Normal cath  . CAROTID ENDARTERECTOMY    . CHOLECYSTECTOMY      Current Outpatient Prescriptions  Medication Sig Dispense Refill  .  clopidogrel (PLAVIX) 75 MG tablet Take 75 mg by mouth daily.      . Dulaglutide (TRULICITY) 1.5 MG/0.5ML SOPN Inject 0.5 mLs into the skin once a week.    . lactulose (CHRONULAC) 10 GM/15ML solution Take 30 mLs (20 g total) by mouth 3 (three) times daily. 946 mL 2  . LEVEMIR FLEXTOUCH 100 UNIT/ML Pen Inject 18-24 Units into the skin 2 (two) times daily.    Marland Kitchen levothyroxine (SYNTHROID, LEVOTHROID) 75 MCG tablet Take 75 mcg by mouth daily. Brand Name Only    . magnesium oxide (MAG-OX) 400 MG tablet Take 1 tablet (400 mg total) by mouth daily. 30 tablet 2  . MILK THISTLE PO Take 1 capsule by mouth 2 (two) times daily.    . Multiple Vitamins-Minerals (MULTIVITAMIN WITH MINERALS) tablet Take 1 tablet by mouth daily. 30 tablet 0  .  pantoprazole (PROTONIX) 40 MG tablet Take 40 mg by mouth daily.     . propranolol (INDERAL) 60 MG tablet Take 60 mg by mouth daily.     . rifaximin (XIFAXAN) 550 MG TABS tablet Take 1 tablet (550 mg total) by mouth 2 (two) times daily. 60 tablet 0  . simvastatin (ZOCOR) 40 MG tablet Take 40 mg by mouth every Monday, Wednesday, and Friday.     Marland Kitchen spironolactone (ALDACTONE) 100 MG tablet Take 100 mg by mouth daily.    Marland Kitchen torsemide (DEMADEX) 20 MG tablet Take 1 tablet (20 mg total) by mouth daily. 30 tablet 0  . VENTOLIN HFA 108 (90 BASE) MCG/ACT inhaler Inhale 2 puffs into the lungs every 6 (six) hours as needed for shortness of breath.      No current facility-administered medications for this visit.     Allergies:   Biaxin [clarithromycin]; Gatifloxacin; Sulfamethoxazole; Sulfonamide derivatives; Actos [pioglitazone]; Amoxicillin; Augmentin [amoxicillin-pot clavulanate]; Farxiga [dapagliflozin]; Invokana [canagliflozin]; Latex; Metformin; Metformin and related; Sulfa antibiotics; Tape; and Penicillins    Social History:  The patient  reports that she quit smoking about 5 months ago. Her smoking use included Cigarettes. She smoked 0.00 packs per day. She has never used smokeless tobacco. She reports that she does not drink alcohol or use drugs.   Family History:  The patient's family history includes Breast cancer in her mother and sister; Colon cancer in her paternal aunt; Diabetes in her brother, father, and sister; Stomach cancer in her paternal aunt.    ROS:  Please see the history of present illness. All other systems are reviewed and negative.    PHYSICAL EXAM: VS:  BP (!) 102/58   Pulse (!) 58   Ht 5\' 5"  (1.651 m)   Wt 149 lb (67.6 kg)   BMI 24.79 kg/m  , BMI Body mass index is 24.79 kg/m. GEN: Well nourished, well developed, female in no acute distress  HEENT: normal for age  Neck: Mild JVD, faint bilateral carotid bruits, L>R, no masses Cardiac: RRR; soft murmur, no rubs, or  gallops Respiratory:  clear to auscultation bilaterally, normal work of breathing GI: soft, very tender, mildly distended, + BS MS: no deformity or atrophy; no edema; distal pulses are 2+ in all 4 extremities   Skin: warm and dry, no rash Neuro:  Strength and sensation are intact Psych: euthymic mood, full affect   EKG:  EKG is not ordered today.  ECHO: 09/26/2016 - Left ventricle: The cavity size was normal. Wall thickness was   normal. Systolic function was normal. The estimated ejection   fraction was in the range of 55%  to 60%. There is akinesis of the   basalinferior myocardium. Left ventricular diastolic function   parameters were normal. - Aortic valve: There was trivial regurgitation. - Mitral valve: Calcified annulus. There was trivial regurgitation.  Recent Labs: 08/17/2016: Magnesium 2.2 09/18/2016: ALT 12; TSH 1.611 09/19/2016: BUN 40; Creatinine, Ser 2.85; Hemoglobin 8.7; Platelets 73; Potassium 4.1; Sodium 141    Lipid Panel    Component Value Date/Time   CHOL 148 08/31/2013 1516   TRIG 107 08/12/2016 1154   HDL 50.90 08/31/2013 1516   CHOLHDL 3 08/31/2013 1516   VLDL 17.0 08/31/2013 1516   LDLCALC 80 08/31/2013 1516     Wt Readings from Last 3 Encounters:  12/23/16 149 lb (67.6 kg)  10/07/16 158 lb 11.2 oz (72 kg)  09/18/16 192 lb (87.1 kg)     Other studies Reviewed: Additional studies/ records that were reviewed today include: Office notes, hospital records and testing.  ASSESSMENT AND PLAN:  1.  Bilateral carotid disease: This is followed by Dr. Allyson Sabal. She gets yearly carotid Dopplers. Continue this. According to prior notes, she is on the Plavix because of the carotid disease. --- Discuss with Dr. Allyson Sabal if she still needs to be on the Plavix. She is not on aspirin.  2. History of Volume overload: She is followed by the renal team. She is on spironolactone and low-dose torsemide. Her volume status is good by exam. I will leave dosing of her diuretics to  the renal team.  3. Chronic kidney disease stage IV: The patient and her husband are aware that she is not a dialysis candidate due to her chronic liver disease. They're accepting of this but she still feels that she has more than 6 months to live.   Current medicines are reviewed at length with the patient today.  The patient has concerns regarding medicines. Concerns were addressed.  The following changes have been made:  no change  Labs/ tests ordered today include:  No orders of the defined types were placed in this encounter.    Disposition:   FU with Dr. Allyson Sabal  Signed, Abdias Hickam, Bjorn Loser, PA-C  12/23/2016 3:17 PM    [facility] Phone: [phone]; Fax: [phone]  This note was written with the assistance of speech recognition software. Please excuse any transcriptional errors.

## 2016-12-23 NOTE — Patient Instructions (Signed)
Medication Instructions:  Your physician recommends that you continue on your current medications as directed. Please refer to the Current Medication list given to you today.  If you need a refill on your cardiac medications before your next appointment, please call your pharmacy.  Follow-Up: Your physician wants you to follow-up in: IN October WITH DR Allyson SabalBERRY.   Thank you for choosing [facility]!!

## 2017-03-04 ENCOUNTER — Ambulatory Visit: Payer: Medicare Other | Admitting: Cardiovascular Disease

## 2017-10-17 DEATH — deceased

## 2018-11-29 IMAGING — CT CT HEAD W/O CM
4 series · 16 of 47 positions shown, 18 images · non-contrast
Comparison: Brain MRI 07/25/2016 and earlier.

CLINICAL DATA: 65-year-old female found unresponsive this morning,
intubated. Initial encounter.

EXAM:
CT HEAD WITHOUT CONTRAST
TECHNIQUE: Contiguous axial images were obtained from the base of the skull
through the vertex without intravenous contrast.

[Series 3: head without · axial · non-contrast · 0.46mm/px · z∈[-93,+27]mm · 7 of 34 slices shown, 9 images]
[im 5/34  brain]
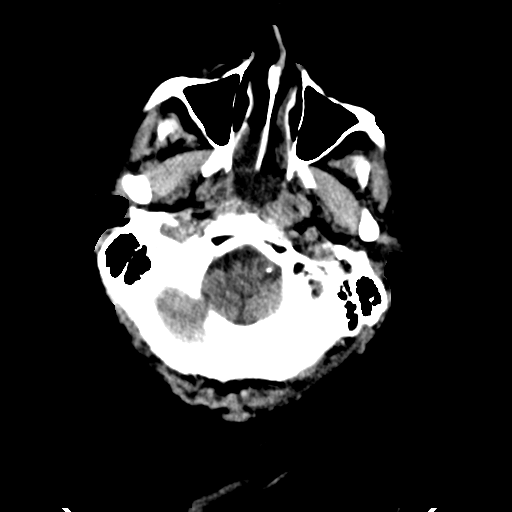
[im 5/34  bone]
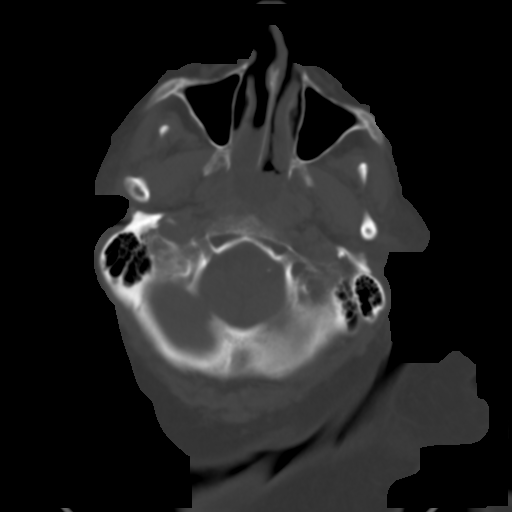
[im 9/34  brain]
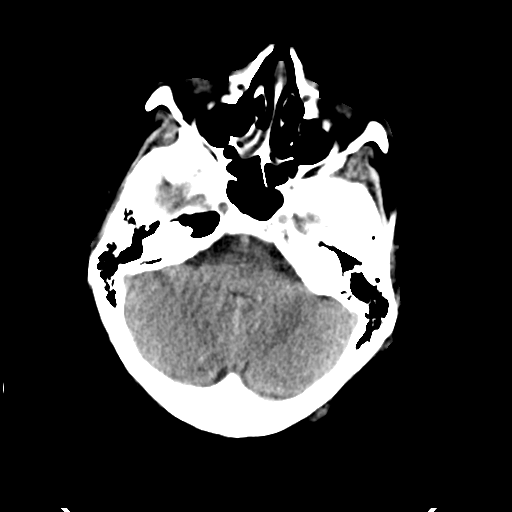
[im 13/34  brain]
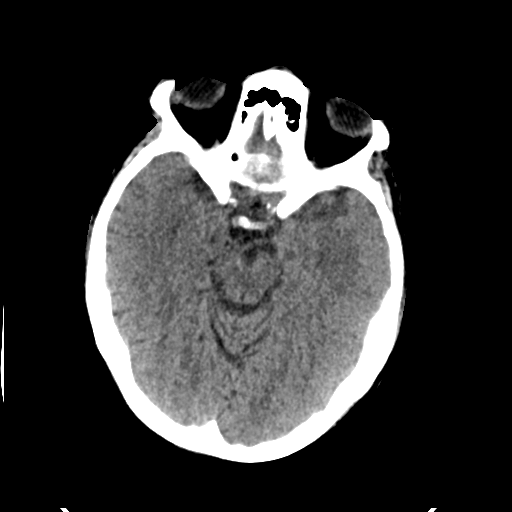
[im 17/34  brain]
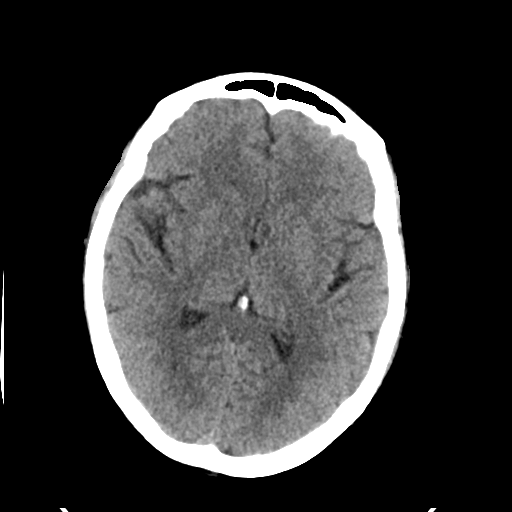
[im 21/34  brain]
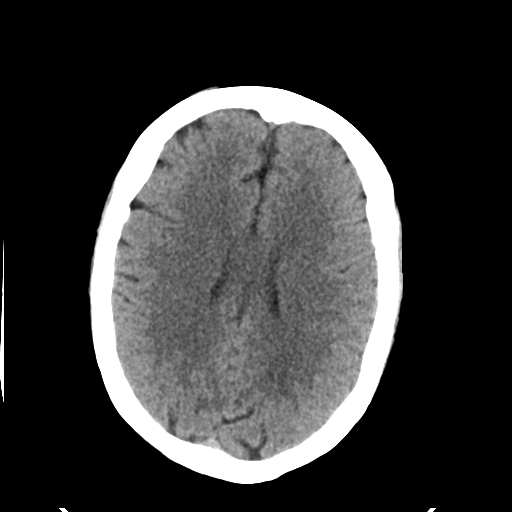
[im 21/34  bone]
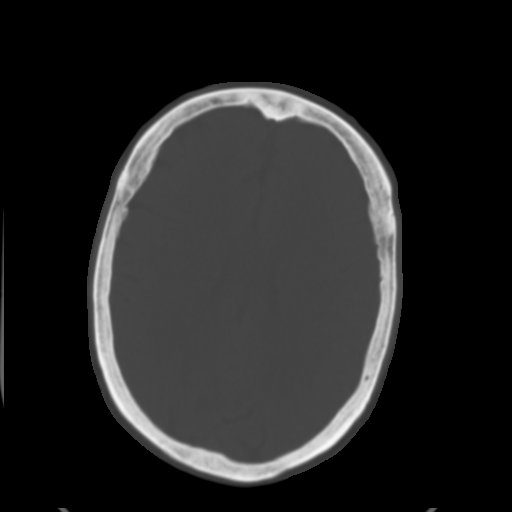
[im 25/34  brain]
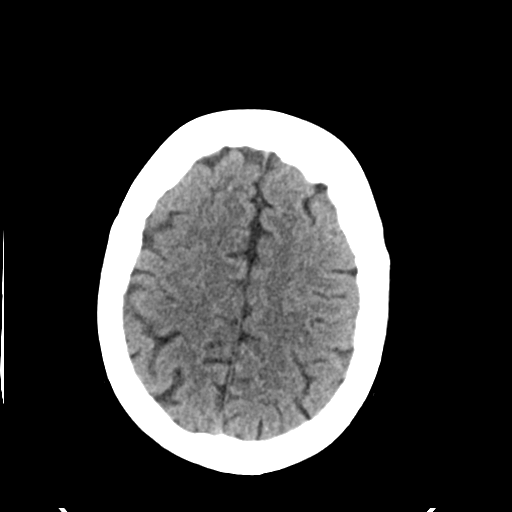
[im 29/34  brain]
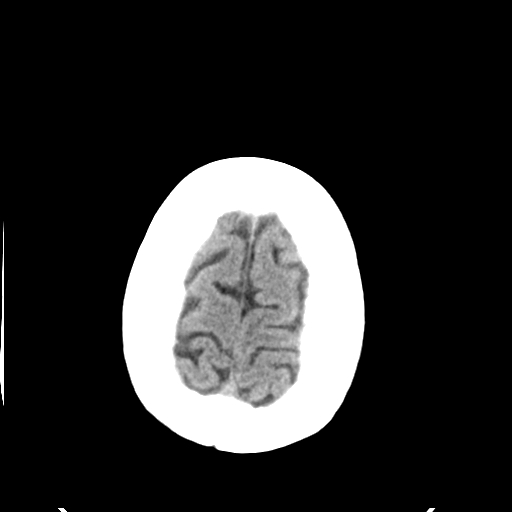

[Series 4: head bone · axial · 0.46mm/px · z∈[-97,-63]mm · 3 of 85 slices shown]
[im 9/85  bone]
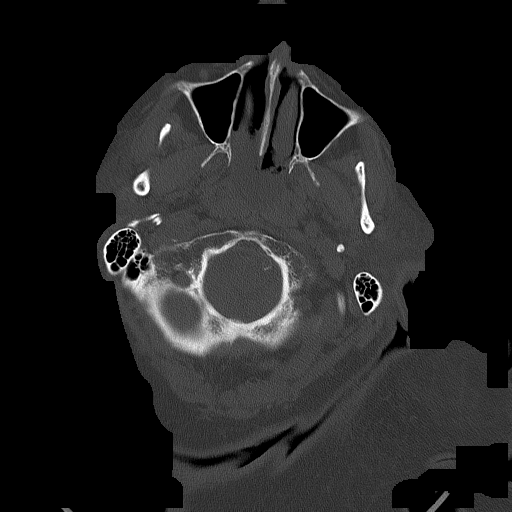
[im 17/85  bone]
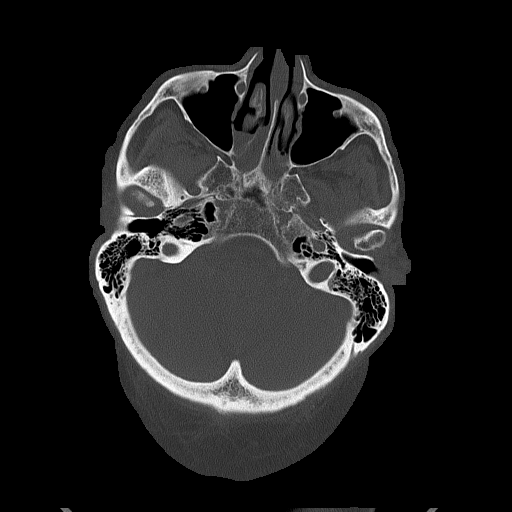
[im 26/85  bone]
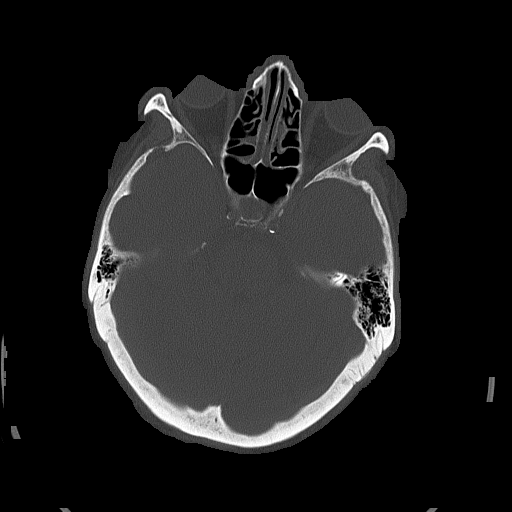

[Series 5: head without cor · coronal · non-contrast · 0.33mm/px · 3 of 71 slices shown]
[im 24/71  brain]
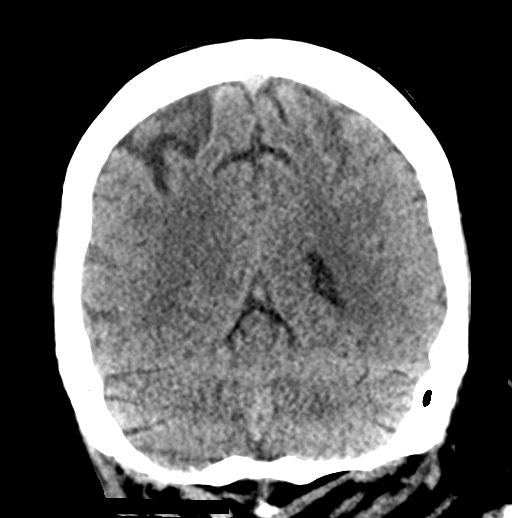
[im 32/71  brain]
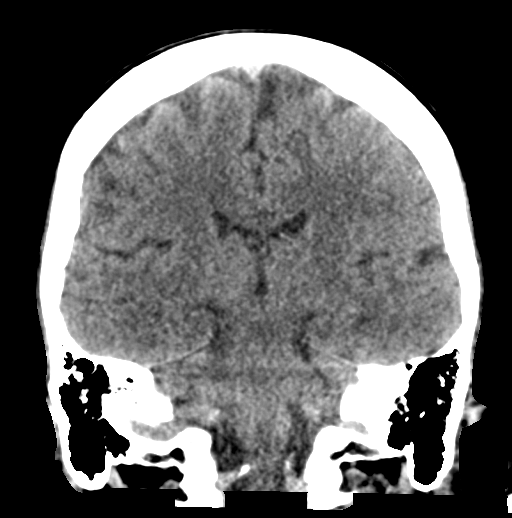
[im 39/71  brain]
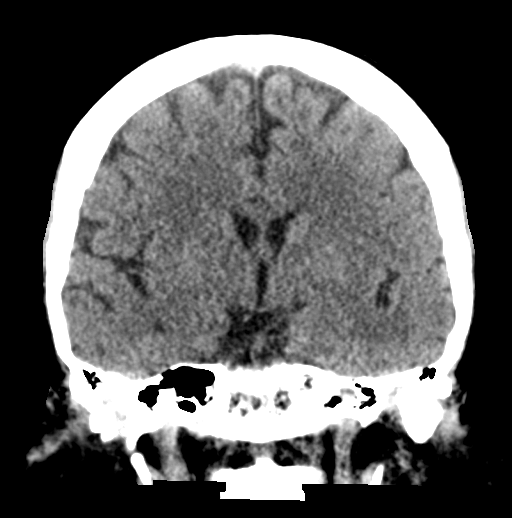

[Series 6: head without sag · sagittal · non-contrast · 0.33mm/px · 3 of 56 slices shown]
[im 19/56  brain]
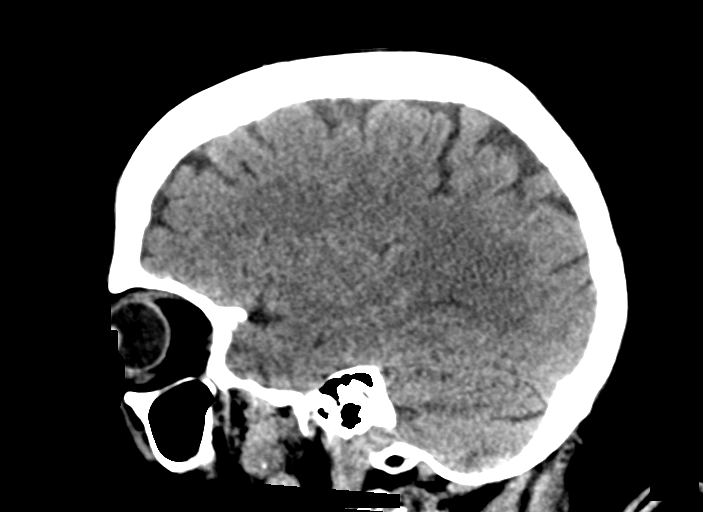
[im 28/56  brain]
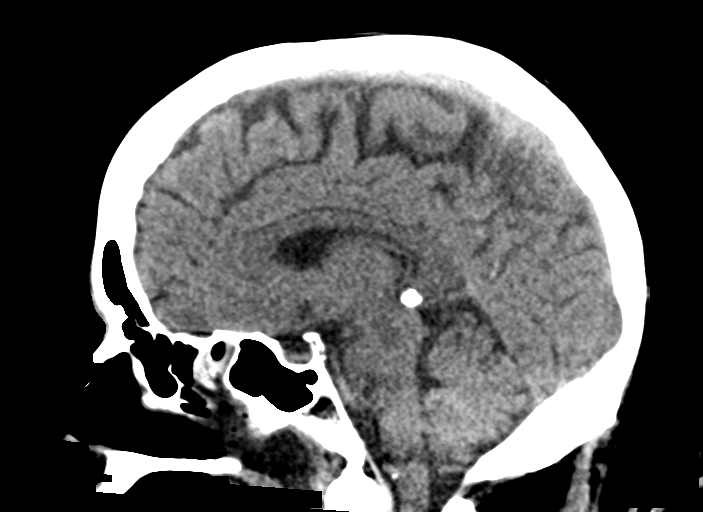
[im 37/56  brain]
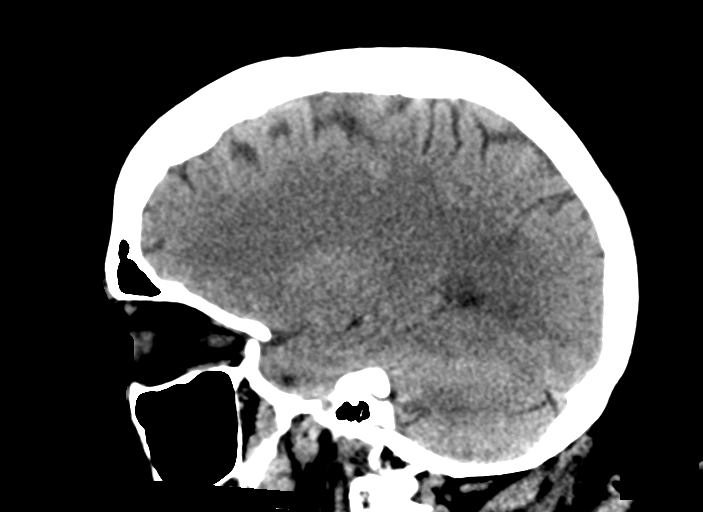

[16 of 47 positions shown; findings below may reference images not displayed]

FINDINGS: Brain: Normal for age cerebral volume. No midline shift,
ventriculomegaly, mass effect, evidence of mass lesion, intracranial
hemorrhage or evidence of cortically based acute infarction.
Gray-white matter differentiation is within normal limits throughout
the brain.

Vascular: Calcified atherosclerosis at the skull base. No suspicious
intracranial vascular hyperdensity.

Skull: Intact.  No acute osseous abnormality identified.

Sinuses/Orbits: Clear.

Fluid in the pharynx and posterior nasal cavity in the setting of
intubation. Partially visible oral enteric tube.

Other: Visualized orbits and scalp soft tissues are within normal
limits.
IMPRESSION: 1. Stable and normal for age non contrast CT appearance of the
brain.
2. Fluid in the nasopharynx and posterior nasal cavity in the
setting of intubation.

## 2018-11-29 IMAGING — DX DG ABD PORTABLE 1V
3 series · 3 of 3 positions shown · non-contrast
Comparison: None.

CLINICAL DATA: NG tube placement

EXAM:
PORTABLE ABDOMEN - 1 VIEW

[abdomen kub (1 of 3)]
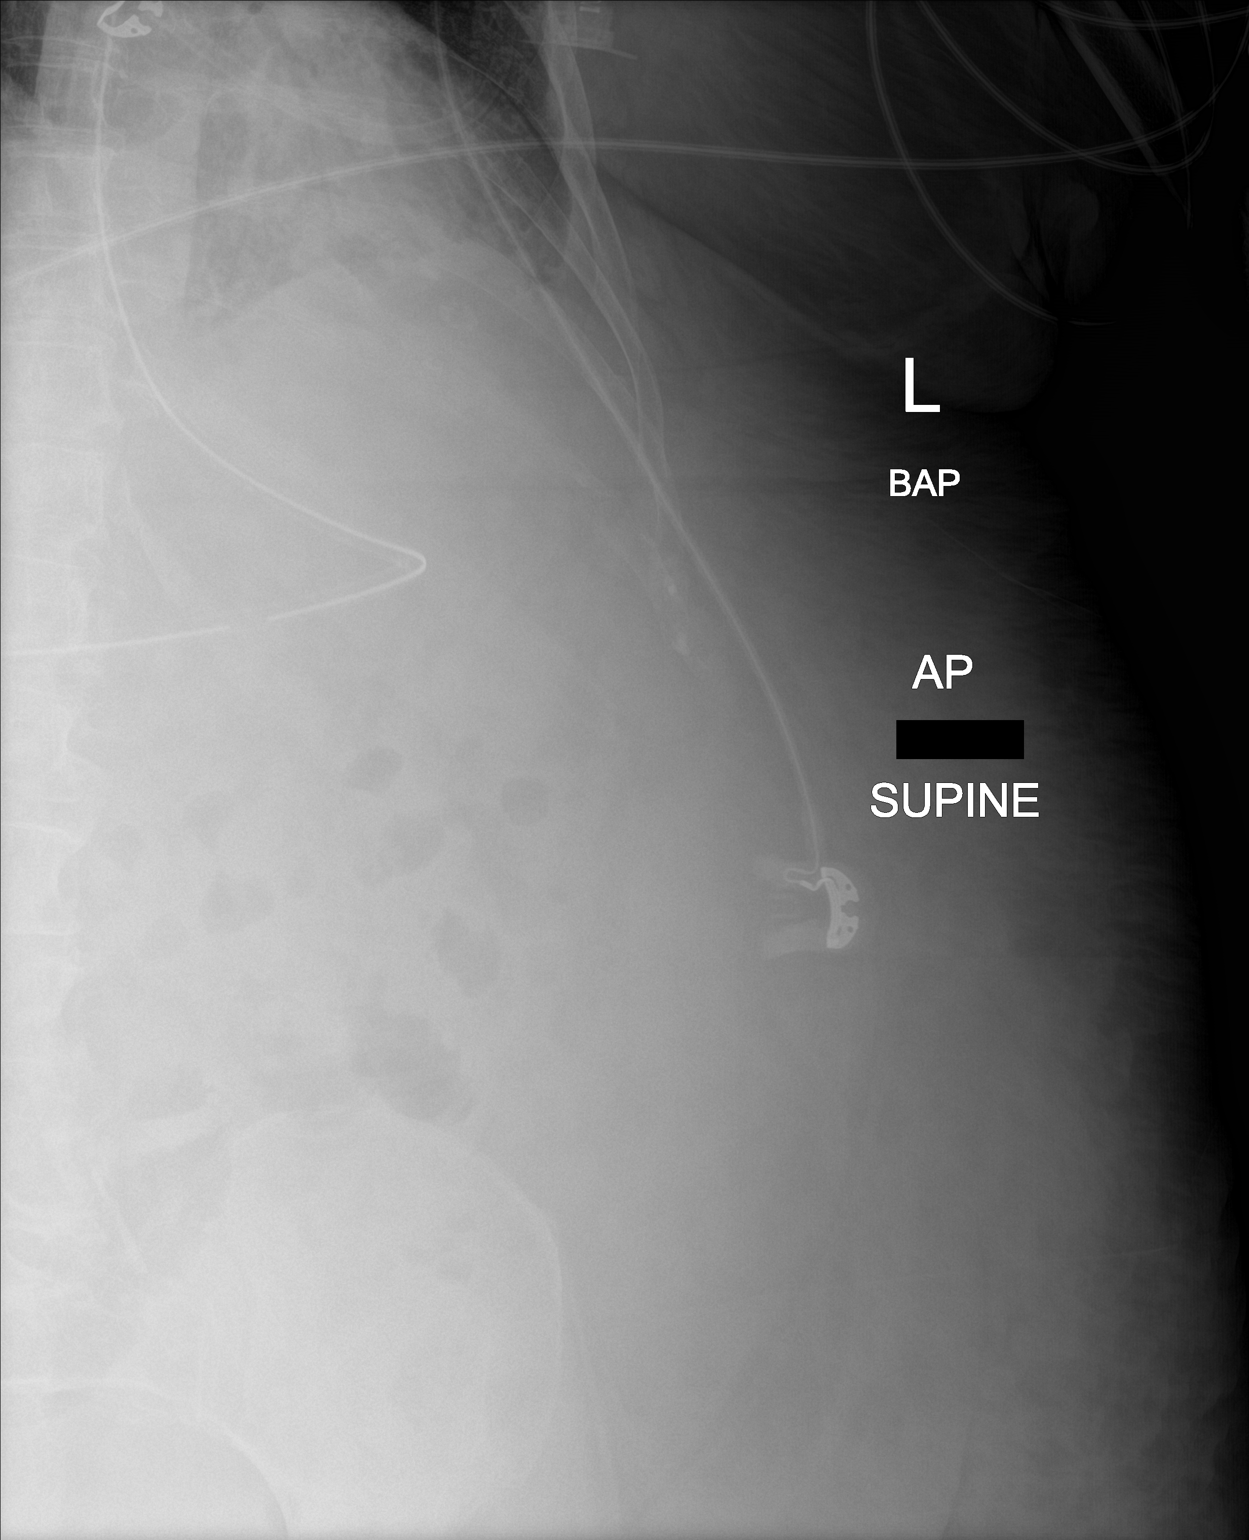

[abdomen kub (2 of 3)]
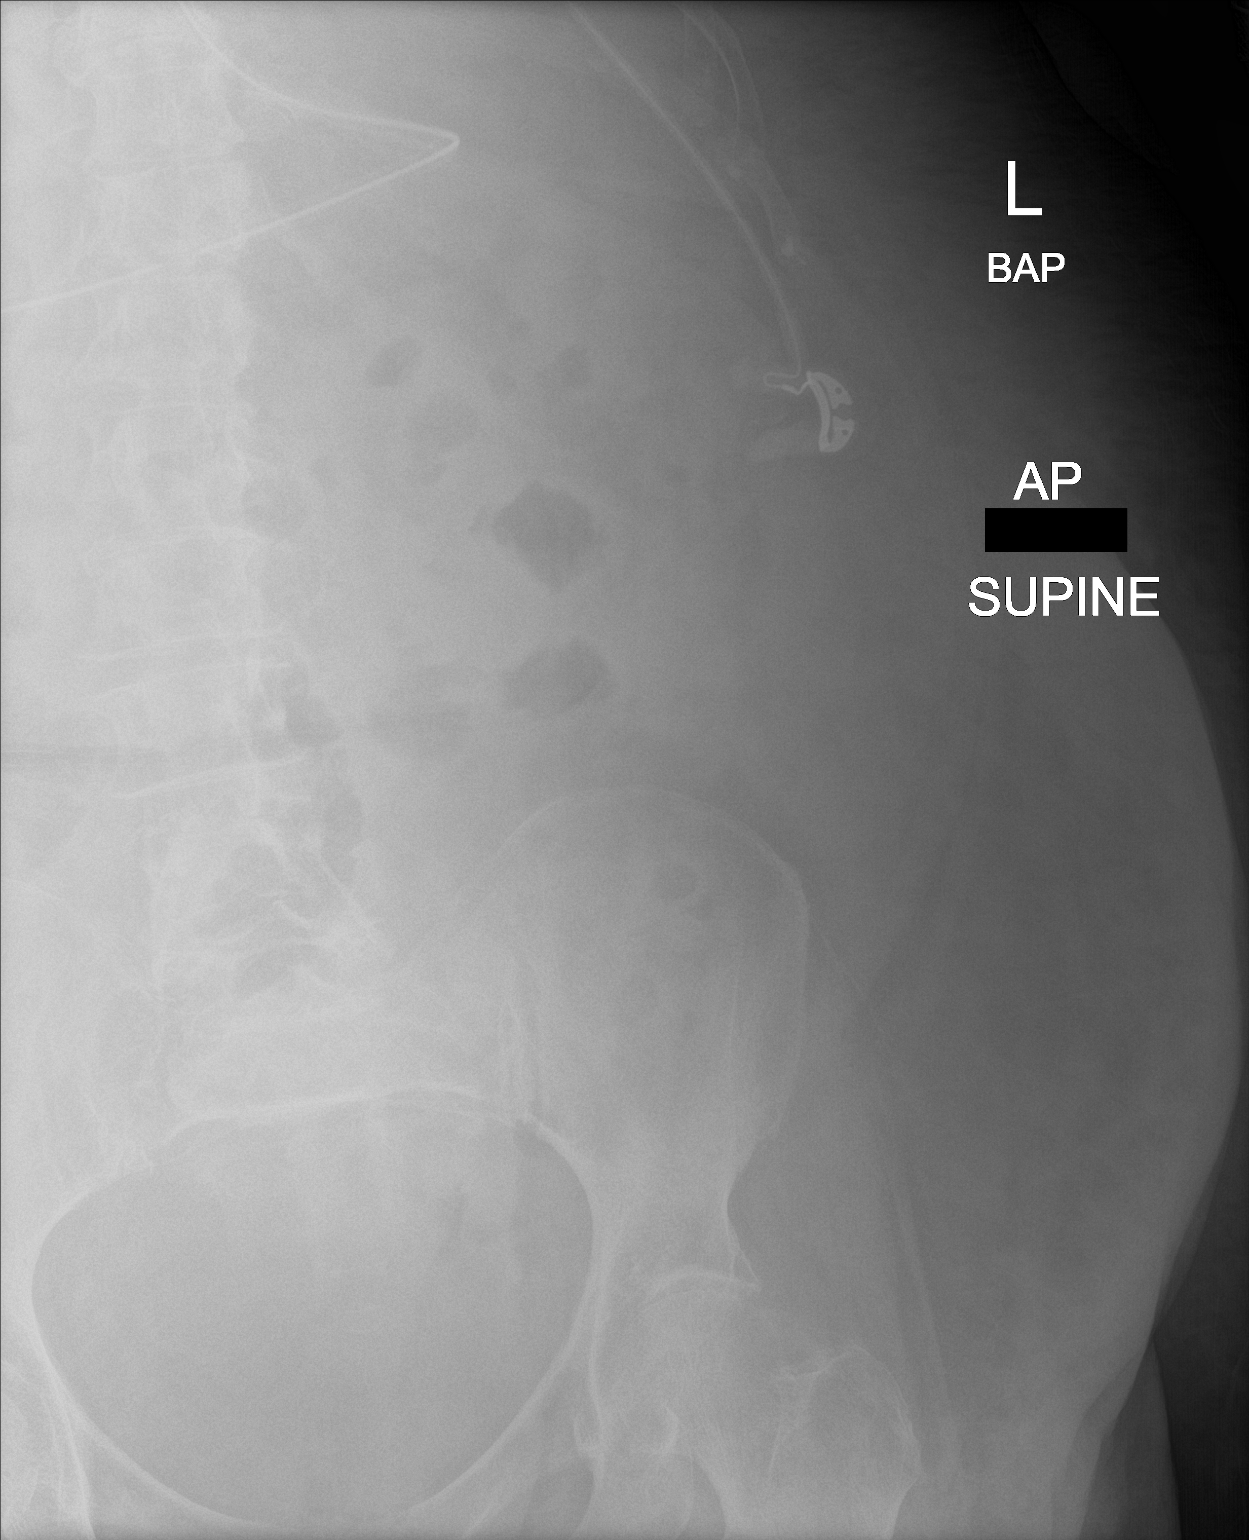

[abdomen kub (3 of 3)]
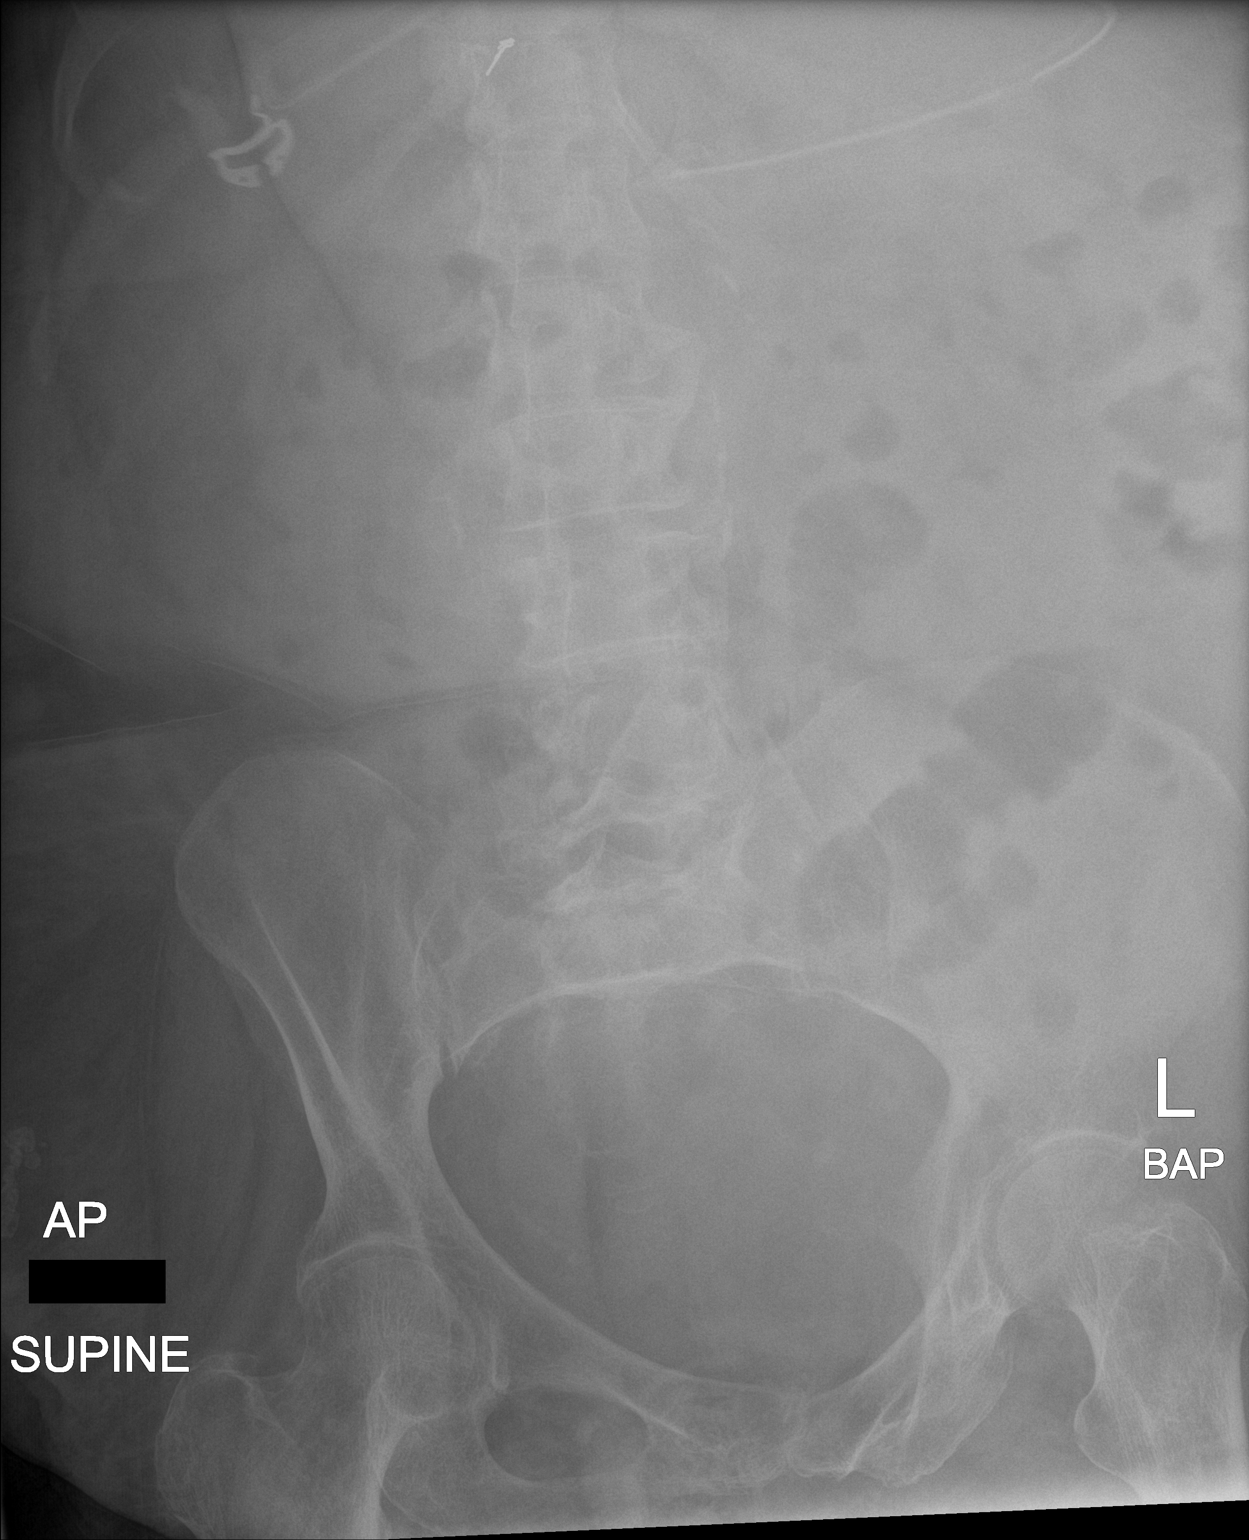

[3 of 3 positions shown; findings below may reference images not displayed]

FINDINGS: There is normal small bowel gas pattern. NG tube in place with tip
in distal stomach.
IMPRESSION: NG tube in place with tip in distal stomach region.

## 2018-11-30 IMAGING — CR DG CHEST 1V PORT
1 series · 1 of 1 positions shown · non-contrast
Comparison: 08/12/2016.

CLINICAL DATA: Respiratory failure.  Shortness of breath.

EXAM:
PORTABLE CHEST 1 VIEW

[AP]
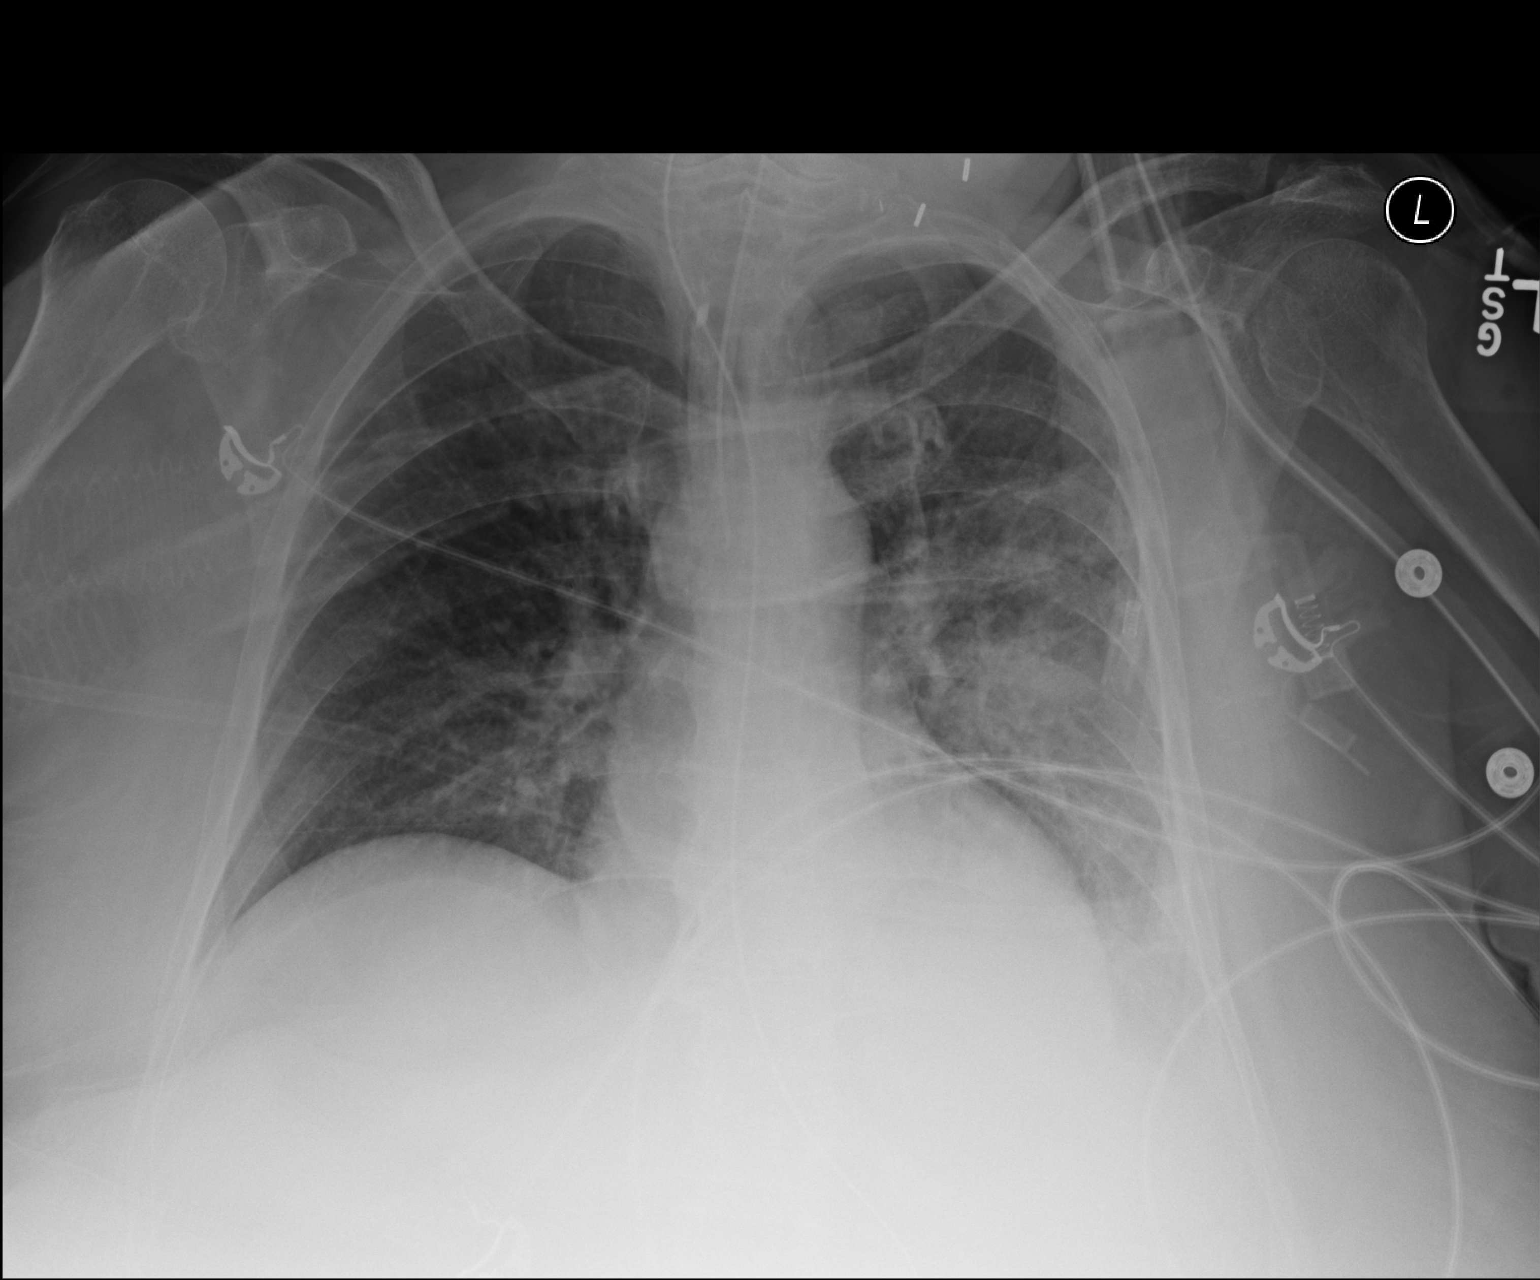

[1 of 1 positions shown; findings below may reference images not displayed]

FINDINGS: Surgical clips left neck. Endotracheal tube and NG tube in stable
position. Heart size normal. Diffuse left lung infiltrate with small
left pleural effusion. No pneumothorax.
IMPRESSION: 1. Lines and tubes in stable position.

2. Developing diffuse infiltrate left lung. Findings consistent
pneumonia . Small left pleural effusion.
